# Patient Record
Sex: Male | Born: 1935 | Race: White | Hispanic: No | Smoking: Former smoker
Health system: Southern US, Community
[De-identification: ages and names within clinical notes are randomized; demographics above are authoritative.]

## PROBLEM LIST (undated history)

## (undated) DIAGNOSIS — M1990 Unspecified osteoarthritis, unspecified site: Secondary | ICD-10-CM

## (undated) DIAGNOSIS — D494 Neoplasm of unspecified behavior of bladder: Secondary | ICD-10-CM

## (undated) DIAGNOSIS — C679 Malignant neoplasm of bladder, unspecified: Secondary | ICD-10-CM

## (undated) DIAGNOSIS — Z789 Other specified health status: Secondary | ICD-10-CM

## (undated) DIAGNOSIS — D0422 Carcinoma in situ of skin of left ear and external auricular canal: Secondary | ICD-10-CM

## (undated) DIAGNOSIS — D229 Melanocytic nevi, unspecified: Secondary | ICD-10-CM

## (undated) DIAGNOSIS — N400 Enlarged prostate without lower urinary tract symptoms: Secondary | ICD-10-CM

## (undated) DIAGNOSIS — B351 Tinea unguium: Secondary | ICD-10-CM

## (undated) HISTORY — PX: EYE SURGERY: SHX253

## (undated) HISTORY — PX: OTHER SURGICAL HISTORY: SHX169

## (undated) HISTORY — PX: TONSILLECTOMY: SUR1361

## (undated) HISTORY — DX: Tinea unguium: B35.1

---
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
## 1996-10-12 DIAGNOSIS — D0422 Carcinoma in situ of skin of left ear and external auricular canal: Secondary | ICD-10-CM

## 1996-10-12 HISTORY — DX: Carcinoma in situ of skin of left ear and external auricular canal: D04.22

## 2000-02-25 ENCOUNTER — Other Ambulatory Visit: Admission: RE | Admit: 2000-02-25 | Discharge: 2000-02-25 | Payer: Self-pay | Admitting: Urology

## 2004-08-31 ENCOUNTER — Encounter: Admission: RE | Admit: 2004-08-31 | Discharge: 2004-08-31 | Payer: Self-pay | Admitting: Family Medicine

## 2004-11-10 ENCOUNTER — Ambulatory Visit: Payer: Self-pay | Admitting: Internal Medicine

## 2004-11-17 ENCOUNTER — Ambulatory Visit: Payer: Self-pay | Admitting: Internal Medicine

## 2004-12-27 ENCOUNTER — Inpatient Hospital Stay (HOSPITAL_COMMUNITY): Admission: EM | Admit: 2004-12-27 | Discharge: 2004-12-29 | Payer: Self-pay | Admitting: Emergency Medicine

## 2008-07-22 ENCOUNTER — Encounter: Admission: RE | Admit: 2008-07-22 | Discharge: 2008-08-16 | Payer: Self-pay | Admitting: Family Medicine

## 2008-11-08 HISTORY — PX: JOINT REPLACEMENT: SHX530

## 2009-01-20 ENCOUNTER — Inpatient Hospital Stay (HOSPITAL_COMMUNITY): Admission: RE | Admit: 2009-01-20 | Discharge: 2009-01-24 | Payer: Self-pay | Admitting: Orthopedic Surgery

## 2009-03-03 ENCOUNTER — Encounter: Admission: RE | Admit: 2009-03-03 | Discharge: 2009-04-10 | Payer: Self-pay | Admitting: Orthopedic Surgery

## 2009-12-26 IMAGING — CR DG CHEST 1V PORT
1 series · 1 of 1 positions shown · non-contrast
Comparison: 01/10/2009.

CLINICAL DATA: Postop fever.

PORTABLE CHEST - 1 VIEW

[view not recorded]
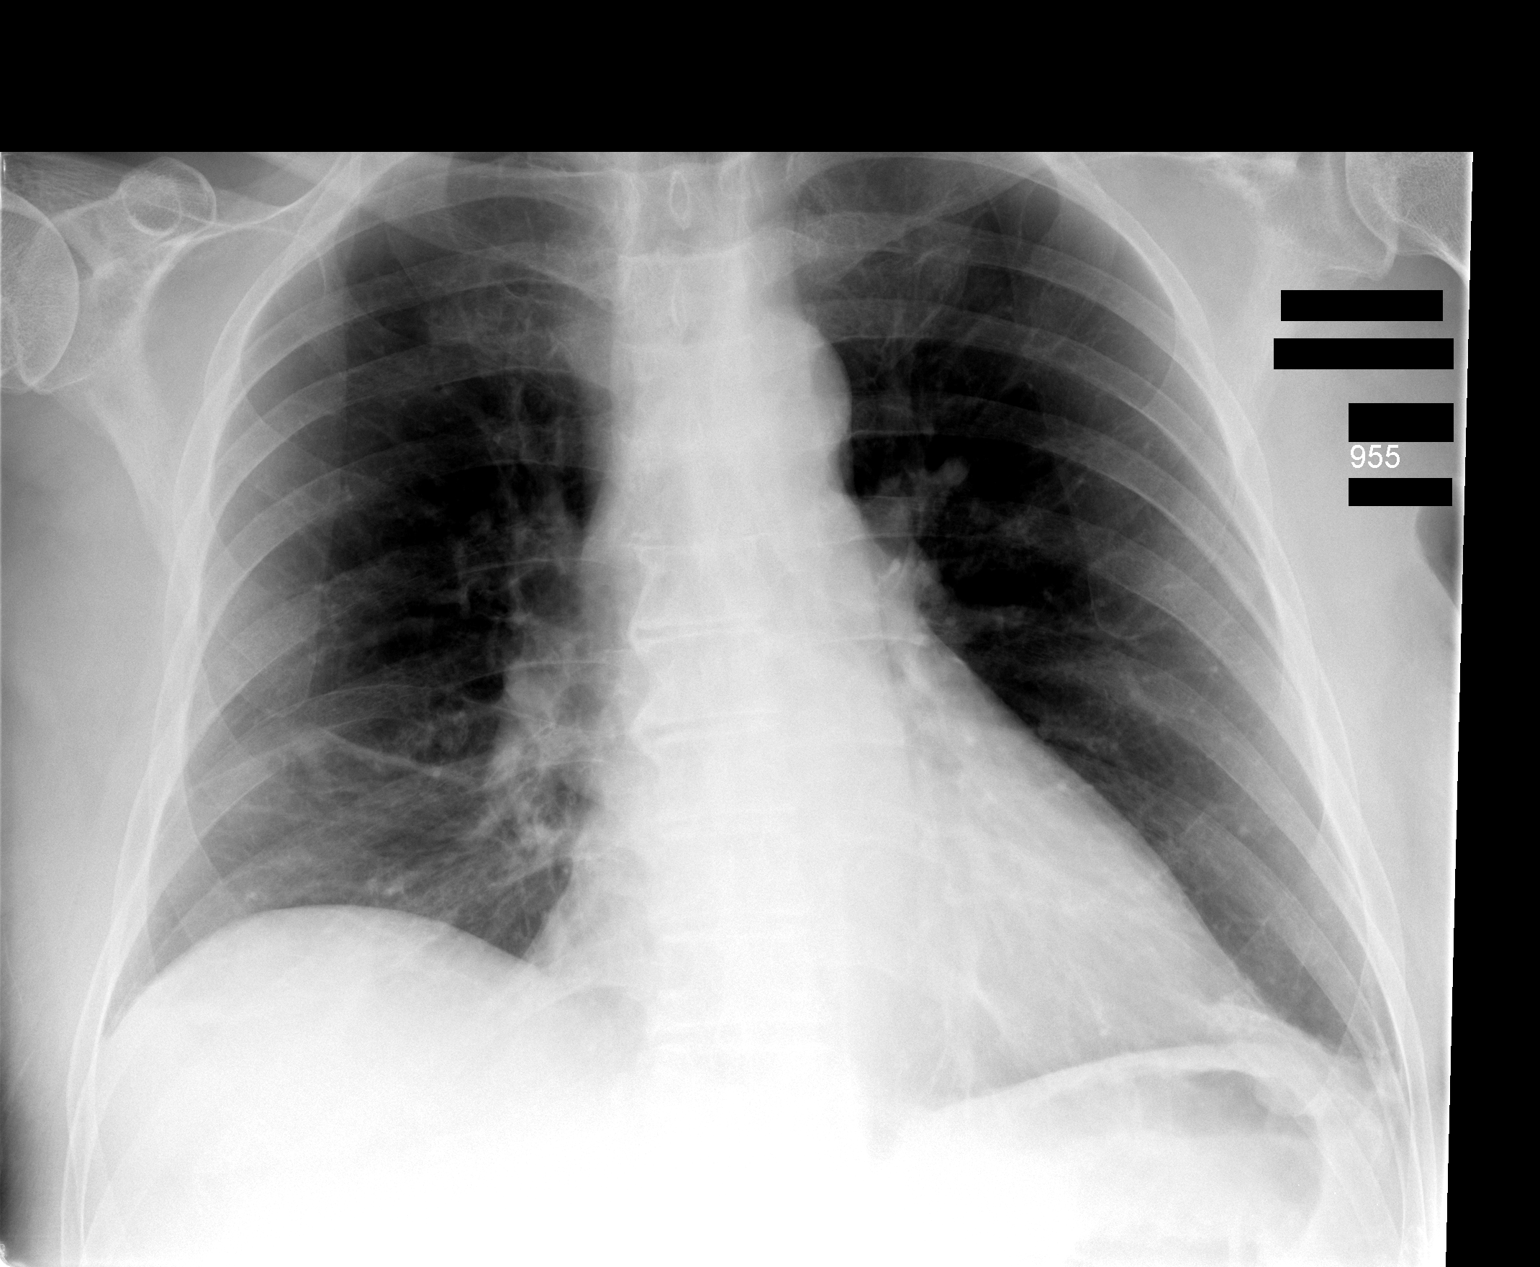

[1 of 1 positions shown; findings below may reference images not displayed]

FINDINGS: Basilar subsegmental atelectasis.  No segmental
infiltrate, pneumothorax or pulmonary edema.  Heart is slightly
enlarged.
IMPRESSION: Bibasilar subsegmental atelectatic changes.

No segmental infiltrate.

No pulmonary edema.

## 2011-02-18 LAB — BASIC METABOLIC PANEL
BUN: 18 mg/dL (ref 6–23)
BUN: 20 mg/dL (ref 6–23)
BUN: 20 mg/dL (ref 6–23)
BUN: 25 mg/dL — ABNORMAL HIGH (ref 6–23)
BUN: 26 mg/dL — ABNORMAL HIGH (ref 6–23)
CO2: 24 mEq/L (ref 19–32)
CO2: 24 mEq/L (ref 19–32)
CO2: 24 mEq/L (ref 19–32)
CO2: 27 mEq/L (ref 19–32)
CO2: 27 mEq/L (ref 19–32)
CO2: 27 mEq/L (ref 19–32)
Calcium: 7.4 mg/dL — ABNORMAL LOW (ref 8.4–10.5)
Calcium: 7.6 mg/dL — ABNORMAL LOW (ref 8.4–10.5)
Calcium: 7.7 mg/dL — ABNORMAL LOW (ref 8.4–10.5)
Calcium: 7.8 mg/dL — ABNORMAL LOW (ref 8.4–10.5)
Calcium: 7.8 mg/dL — ABNORMAL LOW (ref 8.4–10.5)
Calcium: 7.9 mg/dL — ABNORMAL LOW (ref 8.4–10.5)
Chloride: 100 mEq/L (ref 96–112)
Chloride: 101 mEq/L (ref 96–112)
Chloride: 102 mEq/L (ref 96–112)
Chloride: 104 mEq/L (ref 96–112)
Chloride: 97 mEq/L (ref 96–112)
Creatinine, Ser: 0.99 mg/dL (ref 0.4–1.5)
Creatinine, Ser: 1.04 mg/dL (ref 0.4–1.5)
Creatinine, Ser: 1.05 mg/dL (ref 0.4–1.5)
Creatinine, Ser: 1.38 mg/dL (ref 0.4–1.5)
Creatinine, Ser: 1.49 mg/dL (ref 0.4–1.5)
GFR calc Af Amer: 54 mL/min — ABNORMAL LOW (ref >60)
GFR calc Af Amer: 59 mL/min — ABNORMAL LOW (ref >60)
GFR calc Af Amer: >60 mL/min (ref >60)
GFR calc Af Amer: >60 mL/min (ref >60)
GFR calc Af Amer: >60 mL/min (ref >60)
GFR calc Af Amer: >60 mL/min (ref >60)
GFR calc non Af Amer: 45 mL/min — ABNORMAL LOW (ref >60)
GFR calc non Af Amer: 49 mL/min — ABNORMAL LOW (ref >60)
GFR calc non Af Amer: >60 mL/min (ref >60)
GFR calc non Af Amer: >60 mL/min (ref >60)
GFR calc non Af Amer: >60 mL/min (ref >60)
Glucose, Bld: 110 mg/dL — ABNORMAL HIGH (ref 70–99)
Glucose, Bld: 136 mg/dL — ABNORMAL HIGH (ref 70–99)
Glucose, Bld: 148 mg/dL — ABNORMAL HIGH (ref 70–99)
Glucose, Bld: 175 mg/dL — ABNORMAL HIGH (ref 70–99)
Glucose, Bld: 201 mg/dL — ABNORMAL HIGH (ref 70–99)
Glucose, Bld: 218 mg/dL — ABNORMAL HIGH (ref 70–99)
Potassium: 3.6 mEq/L (ref 3.5–5.1)
Potassium: 3.6 mEq/L (ref 3.5–5.1)
Potassium: 4 mEq/L (ref 3.5–5.1)
Potassium: 4.4 mEq/L (ref 3.5–5.1)
Potassium: 4.7 mEq/L (ref 3.5–5.1)
Potassium: 4.2 mEq/L (ref 3.5–5.1)
Sodium: 127 mEq/L — ABNORMAL LOW (ref 135–145)
Sodium: 130 mEq/L — ABNORMAL LOW (ref 135–145)
Sodium: 131 mEq/L — ABNORMAL LOW (ref 135–145)
Sodium: 134 mEq/L — ABNORMAL LOW (ref 135–145)
Sodium: 134 mEq/L — ABNORMAL LOW (ref 135–145)
Sodium: 135 mEq/L (ref 135–145)

## 2011-02-18 LAB — URINALYSIS, ROUTINE W REFLEX MICROSCOPIC
Hgb urine dipstick: NEGATIVE
Ketones, ur: NEGATIVE mg/dL
Protein, ur: NEGATIVE mg/dL
Urobilinogen, UA: 0.2 mg/dL (ref 0.0–1.0)

## 2011-02-18 LAB — PROTIME-INR
INR: 1.3 (ref 0.00–1.49)
INR: 1.5 (ref 0.00–1.49)
INR: 1.6 — ABNORMAL HIGH (ref 0.00–1.49)
INR: 1.7 — ABNORMAL HIGH (ref 0.00–1.49)
INR: 1 (ref 0.00–1.49)
Prothrombin Time: 17 seconds — ABNORMAL HIGH (ref 11.6–15.2)
Prothrombin Time: 18.9 seconds — ABNORMAL HIGH (ref 11.6–15.2)
Prothrombin Time: 19.7 seconds — ABNORMAL HIGH (ref 11.6–15.2)
Prothrombin Time: 21 seconds — ABNORMAL HIGH (ref 11.6–15.2)
Prothrombin Time: 13.7 seconds (ref 11.6–15.2)

## 2011-02-18 LAB — TYPE AND SCREEN: ABO/RH(D): A POS

## 2011-02-18 LAB — HEMOGLOBIN AND HEMATOCRIT, BLOOD: HCT: 26 % — ABNORMAL LOW (ref 39.0–52.0)

## 2011-02-18 LAB — CBC
HCT: 23.1 % — ABNORMAL LOW (ref 39.0–52.0)
HCT: 23.2 % — ABNORMAL LOW (ref 39.0–52.0)
HCT: 26.7 % — ABNORMAL LOW (ref 39.0–52.0)
HCT: 26.9 % — ABNORMAL LOW (ref 39.0–52.0)
HCT: 30 % — ABNORMAL LOW (ref 39.0–52.0)
HCT: 38.5 % — ABNORMAL LOW (ref 39.0–52.0)
Hemoglobin: 10.1 g/dL — ABNORMAL LOW (ref 13.0–17.0)
Hemoglobin: 7.9 g/dL — CL (ref 13.0–17.0)
Hemoglobin: 7.9 g/dL — CL (ref 13.0–17.0)
Hemoglobin: 9.1 g/dL — ABNORMAL LOW (ref 13.0–17.0)
Hemoglobin: 9.2 g/dL — ABNORMAL LOW (ref 13.0–17.0)
Hemoglobin: 13 g/dL (ref 13.0–17.0)
MCHC: 33.8 g/dL (ref 30.0–36.0)
MCHC: 33.9 g/dL (ref 30.0–36.0)
MCHC: 34 g/dL (ref 30.0–36.0)
MCHC: 34.3 g/dL (ref 30.0–36.0)
MCHC: 34.3 g/dL (ref 30.0–36.0)
MCHC: 33.9 g/dL (ref 30.0–36.0)
MCV: 91.8 fL (ref 78.0–100.0)
MCV: 92.8 fL (ref 78.0–100.0)
MCV: 93.3 fL (ref 78.0–100.0)
MCV: 93.4 fL (ref 78.0–100.0)
MCV: 95.8 fL (ref 78.0–100.0)
MCV: 95.8 fL (ref 78.0–100.0)
Platelets: 121 10*3/uL — ABNORMAL LOW (ref 150–400)
Platelets: 121 10*3/uL — ABNORMAL LOW (ref 150–400)
Platelets: 125 10*3/uL — ABNORMAL LOW (ref 150–400)
Platelets: 128 10*3/uL — ABNORMAL LOW (ref 150–400)
Platelets: 145 10*3/uL — ABNORMAL LOW (ref 150–400)
Platelets: 218 10*3/uL (ref 150–400)
RBC: 2.42 MIL/uL — ABNORMAL LOW (ref 4.22–5.81)
RBC: 2.48 MIL/uL — ABNORMAL LOW (ref 4.22–5.81)
RBC: 2.9 MIL/uL — ABNORMAL LOW (ref 4.22–5.81)
RBC: 2.91 MIL/uL — ABNORMAL LOW (ref 4.22–5.81)
RBC: 3.21 MIL/uL — ABNORMAL LOW (ref 4.22–5.81)
RBC: 4.02 MIL/uL — ABNORMAL LOW (ref 4.22–5.81)
RDW: 12.8 % (ref 11.5–15.5)
RDW: 14.9 % (ref 11.5–15.5)
RDW: 15 % (ref 11.5–15.5)
RDW: 15.4 % (ref 11.5–15.5)
RDW: 15.5 % (ref 11.5–15.5)
RDW: 13.2 % (ref 11.5–15.5)
WBC: 6 10*3/uL (ref 4.0–10.5)
WBC: 7.1 10*3/uL (ref 4.0–10.5)
WBC: 8.1 10*3/uL (ref 4.0–10.5)
WBC: 8.8 10*3/uL (ref 4.0–10.5)
WBC: 9.2 10*3/uL (ref 4.0–10.5)
WBC: 3.9 10*3/uL — ABNORMAL LOW (ref 4.0–10.5)

## 2011-02-18 LAB — COMPREHENSIVE METABOLIC PANEL
ALT: 22 U/L (ref 0–53)
AST: 29 U/L (ref 0–37)
Albumin: 3.8 g/dL (ref 3.5–5.2)
Alkaline Phosphatase: 46 U/L (ref 39–117)
BUN: 19 mg/dL (ref 6–23)
CO2: 30 mEq/L (ref 19–32)
Calcium: 9.6 mg/dL (ref 8.4–10.5)
Chloride: 103 mEq/L (ref 96–112)
Creatinine, Ser: 1.12 mg/dL (ref 0.4–1.5)
GFR calc Af Amer: >60 mL/min (ref >60)
GFR calc non Af Amer: >60 mL/min (ref >60)
Glucose, Bld: 112 mg/dL — ABNORMAL HIGH (ref 70–99)
Potassium: 4.5 mEq/L (ref 3.5–5.1)
Sodium: 140 mEq/L (ref 135–145)
Total Bilirubin: 0.8 mg/dL (ref 0.3–1.2)
Total Protein: 7.8 g/dL (ref 6.0–8.3)

## 2011-02-18 LAB — HEMOCCULT GUIAC POC 1CARD (OFFICE): Fecal Occult Bld: NEGATIVE

## 2011-03-23 NOTE — Op Note (Signed)
NAME:  ESKER, DEVER NO.:  [id number]   MEDICAL RECORD NO.:  [id number]          PATIENT TYPE:  INP   LOCATION:  0007                         FACILITY:  [facility]   PHYSICIAN:  Ollen Gross, M.D.    DATE OF BIRTH:  [birth year]   DATE OF PROCEDURE:  01/20/2009  DATE OF DISCHARGE:                               OPERATIVE REPORT   PREOPERATIVE DIAGNOSIS:  Osteoarthritis right knee.   POSTOPERATIVE DIAGNOSIS:  Osteoarthritis right knee.   PROCEDURE:  Right total knee arthroplasty.   SURGEON:  Ollen Gross, M.D.   ASSISTANT:  Avel Peace PA-C   ANESTHESIA:  Spinal.   ESTIMATED BLOOD LOSS:  Minimal.   DRAINS:  None.   TOURNIQUET TIME:  42 minutes at 300 mmHg.   COMPLICATIONS:  None.   CONDITION:  Stable to recovery room.   BRIEFI CLINICAL NOTE:  Mr. Granade is an 77 year old male with end-stage  arthritis of the right knee with progressively worsening pain and  dysfunction.  He has significant valgus deformity of the knee.  He has  failed nonoperative management and presents for total knee arthroplasty.   PROCEDURE IN DETAIL:  After successful administration of spinal  anesthetic, a tourniquet was placed on the right thigh and right lower  extremity prepped and draped in the usual sterile fashion.  Extremity  was wrapped in Esmarch, knee flexed, tourniquet inflated to 300 mmHg.  Midline incision was made with a 10 blade through subcutaneous tissue to  the level of the extensor mechanism.  Fresh blade is used to make a  lateral parapatellar arthrotomy.  I went lateral because of valgus  deformity.  Soft tissue of the proximal lateral tibia subperiosteally  elevated around the joint line to the posterolateral corner but not  including the structures of the posterolateral corner.  Patella was  everted medially, knee flexed 90 degrees and ACL and PCL removed.  Drill  was used to create a starting hole in the distal femur.  The canal was  thoroughly irrigated.   The 5 degree right valgus alignment guide is  placed and referencing off the posterior condyles, rotations marked and  the block pinned to remove 11 mm off the distal femur.  I took 11  because of preop flexion contracture.  Distal femoral resection is made  with an oscillating saw.  Sizing blocks placed, size 5 is most  appropriate.  Rotations marked off the epicondylar axis.  The size 5  cutting block is placed and the anterior, posterior and chamfer cuts are  made.   The tibia is subluxed forward and the menisci are removed.  Extramedullary tibial alignment guide is placed referencing proximally  at the medial aspect of the tibial tubercle and distally along the  second metatarsal axis and tibial crest.  The block is pinned to remove  about 4 mm off the more deficient lateral side.  Tibial resection is  made with an oscillating saw.  Size 5 is most appropriate tibial  component and the proximal tibia prepared with the modular drill and  keel punch for the size 5.  Femoral preparation is  completed with the  intercondylar cut.   Size 5 mobile bearing tibial trial, size 5 posterior stabilized femoral  trial and a 10 mm posterior stabilized rotating platform insert trial  are placed.  With the 10 there is some AP laxity, so I went to 12.5  which allowed for full extension with excellent varus-valgus and  anterior-posterior balance throughout full range of motion.  The patella  was everted again and thickness measured to be 28 mm.  Freehand  resection is taken to 16 mm, 41 template is placed, lug holes were  drilled, trial patella was placed and it tracks normally.  Osteophytes  removed off the posterior femur with the trial in place.  All trials  were removed and the cut bone surfaces are prepared with pulsatile  lavage.  Cement was mixed and once ready for implantation, the size 5  mobile bearing tibial tray, size 5 posterior stabilized femur and 41  patella are cemented into place.   The patella was held with a clamp.  Trial 12.5-mm inserts placed, knee held in full extension and all  extruded cement removed.  Once cement is fully hardened then the  permanent 12.5 mm posterior stabilized rotating platform insert is  placed into the tibial tray.  The wound was copiously irrigated with  saline solution and the FloSeal injected on the posterior capsule,  mediolateral gutters and suprapatellar area.  Moist sponge is placed and  tourniquet released for total time of 42 minutes.  Sponge was held for 2  minutes and removed.  Minimal bleeding was encountered.  Bleeding that  is encountered stopped with electrocautery.  The joints again irrigated  with saline, then the arthrotomy closed with interrupted #1 PDS leaving  open a small area from the superior to inferior pole of the patella to  serve as a mini lateral release.  Flexion against gravity is about 135  degrees and the patella tracks normally.  Subcu was closed with  interrupted 2-0 Vicryl and subcuticular running 4-0 Monocryl.  The  incision was cleaned and dried and Steri-Strips and bulky sterile  dressing applied.  He is then placed into a knee immobilizer, awakened  and transferred to recovery in stable condition.      Ollen Gross, M.D.  Electronically Signed     FA/MEDQ  D:  01/20/2009  T:  01/20/2009  Job:  [id number]

## 2011-03-23 NOTE — Discharge Summary (Signed)
NAME:  Zachary Obrien, Zachary Obrien              ACCOUNT NO.:  [id number]   MEDICAL RECORD NO.:  [id number]          PATIENT TYPE:  INP   LOCATION:  1614                         FACILITY:  [facility]   PHYSICIAN:  Ollen Gross, M.D.    DATE OF BIRTH:  06-Mar-1921   DATE OF ADMISSION:  01/20/2009  DATE OF DISCHARGE:  01/24/2009                               DISCHARGE SUMMARY   ADMITTING DIAGNOSES:  1. Osteoarthritis, right knee.  2. Macular degeneration.  3. Benign prostatic hypertrophy.  4. Elevated PSA (2 negative biopsies in the past).  5. Iron deficiency anemia.   DISCHARGE DIAGNOSES:  1. Osteoarthritis, right knee, status post right total knee      replacement arthroplasty.  2. Postop acute blood loss anemia.  3. Status post transfusion without sequelae.  4. Hyponatremia, labs pending.  5. Macular degeneration.  6. Benign prostatic hypertrophy.  7. Elevated PSA (2 negative biopsies in the past).  8. Iron deficiency anemia.   PROCEDURE:  January 20, 2009, right total knee.  Surgeon, Dr. Lequita Halt.  Assistant, Avel Peace, P.A.-C.  Spinal anesthesia with Duramorph  added.   CONSULTS:  None.   BRIEF HISTORY:  Mr. Zachary Obrien is an 77 year old male with end-stage  arthritis of the right knee, progressive worsening pain and dysfunction,  significant valgus deformity, failed nonoperative management, now  presents for total knee arthroplasty.   LABORATORY DATA:  Preop CBC showed a hemoglobin of 13.0, hematocrit of  38.5, white cell count 3.9, platelets 218.  PT/INR 13.7 and 1.0 with a  PTT of 41.  Chem panel on admission all within normal limits.  Preop UA  was negative.  Postop hemoglobin dropped down to 8.7 and 7.9, was given  blood, it came back up to 9.1.  Unfortunately, it drifted back down  again to 7.9.  We did check his stool and it was Hemoccult negative.  Post transfusion, second transfusion, 10.1.  Last noted H and H 9.2 and  26.7.  Serial pro times followed per Coumadin protocol.  Last  noted  PT/INR 21.0 and 1.7.  BMET on admission, sodium dropped from 140 to 134,  got down to 130, back up to 131, but dropped again down to 127, usually  a dilutional component.  Glucose went up from 112 to 218, back down to  110.  Remaining electrolytes remained within normal limits.  Again, the  occult blood fecal was negative.   X-RAYS:  Two-view chest x-ray, January 10, 2009, stable cardiopulmonary  appearance, no acute disease.  We did a portable chest x-ray on January 24, 2009, do not have the dictation but the COD report showed bibasilar  subsegmental atelectasis with no infiltrate and no effusion.  EKG dated  January 03, 2009, sinus rhythm at 78, normal intervals and axis, normal  EKG.   HOSPITAL COURSE:  Patient admitted to [facility], take to the  OR, underwent above-said procedure without complications.  Patient  tolerated procedure well and later transferred to recovery room on  orthopedic floor.  Started on PCA and p.o. analgesic pain control  following surgery and given 2 hours of  postop IV antibiotics, started on  Coumadin for DVT prophylaxis.  He was doing pretty well on the morning  of surgery but he had a little bit of mild hypotension which was felt to  be due to his blood loss.  His hemoglobins was down to 7.9.  His output  was low also.  The low output was felt to be secondary to the anemia  which was more acute although he did have chronic anemia going in with a  history of iron-deficiency anemia and also felt to be due to the low  pressure so we gave him fluids and also transfused him.  Another  component of his hypotension was probably the Duramorph that was added.  We rechecked his BMET to make sure he was not going in to renal  insufficiency.  His BUN and creatinine remained stable.  His blood count  responded.  He got 2 units and it came back up to 9.  By postop day 2,  he was sitting up in bed.  Pressure was back up in the 120s.  Hemoglobin  looked  better.  Dressing change, incision looked good.  We got social  work involved because we felt like he would need an inpatient stay for  rehab facility.  Sodium was a little bit low so we backed down on his  fluids and he had a positive volume from the fluids we had to give for  the hypotension and also from the blood.  Felt he would diurese pretty  well.  His pressure was back.  By day 3, he was doing well.  No  complaints.  Thigh was a little swollen but nothing out of the ordinary.  Knee looked good.  Incision looked good although his hemoglobin was down  a little bit again down to 7.9.  We gave him 2 more units of blood.  We  did guaiac his stool and it did prove to be negative.  There were no  occult signs of any kind of blood loss from the GI tract.  We did put  him on a GI medication.  The sodium was up a little bit from 130 to 131.  We rechecked his labs the following day.  His hemoglobin was back up  after the transfusion and it was back up to 10.1 after the blood and  then on the day of discharge on January 24, 2009, his hemoglobin was 9.2.  He was seen in rounds, had a little bit of congestion, a little mild low-  grade temp which was felt to be just postop so we did get a chest x-ray.  That chest x-ray on the last day did show some subsegmental atelectasis.  We encouraged incentive spirometer although did not show any effusion  and did not show any infiltrates, it was negative for those.  He was  doing well.  Bed was found at [facility] and we decided patient would be  discharged over at that time.   DISCHARGE PLAN:  1. Patient will be transferred over to [facility] on      January 24, 2009.  2. Discharge diagnoses:  Please see above.  3. Discharge meds.   CURRENT MEDICATIONS:  Include:  1. Coumadin protocol, please titrate the Coumadin level for target INR      between 2.0 and 3.0, needs to be on Coumadin for a total of 3 weeks      from the date of surgery of  January 20, 2009.  2. Colace 100 mg p.o. b.i.d.  3. Finasteride 5 mg p.o. daily.  4. Nu-Iron 150 mg p.o. b.i.d.  He needs to be on iron for 3 weeks, the      150 Nu-Iron, and then after the 3 weeks he can go back on to his      daily iron supplement which is 65 mg daily.  5. We did put him on Protonix 40 mg.  I would continue the Protonix 40      mg daily while he is on the Coumadin for GI prophylaxis then      discontinue the Protonix once he is off his Coumadin.  6. Percocet 5 mg 1 or 2 every 4 hours as needed for pain.  7. Tylenol 325 one or two every 4 to 6 hours as needed for mild pain,      temp, or headache.  8. Robaxin 500 mg p.o. every 6 to 8 hours p.r.n. spasm.  9. Laxative of choice.  10.Enema of choice.   DIET:  As tolerated.   ACTIVITY:  He is weightbearing as tolerated to the right lower  extremity.  Continue gait training, ambulation, ADLs.  We would like to  arrange a CPM machine at [facility].  He needs to be in the CPM  machine 2 to 3 sessions a day, maximum 2 to 3 hours per session, do not  let the patient go over 3 hours in a session but he needs to have 2 to 3  sessions a day in the CPM machine.  Slowly increase the flexion every  day or every other day to improve his range of motion.  Continue with PT  and OT for gait training, ambulation, ADLs, and total knee protocol.  He  may start showering, however, do not submerge the incision under water.   DISPOSITION:  [facility].   CONDITION UPON DISCHARGE:  Improving.   COUMADIN PROTOCOL:  The pro time INR on admission was 13.7 with an INR  of 1.1 on the date of surgery.  He was given a 4-mg tablet.  On postop  day 1, his INR was 1.3.  He was given a 4-mg tablet.  On postop day 2,  his INR was up to 1.5.  He was given 4-mg tablet.  On postop day 3, his  INR was up to 1.6.  He was given a 4-mg tablet.  On postop day 4, his  INR was 1.7 at the time of transfer.      Alexzandrew L. Perkins,  P.A.C.      Ollen Gross, M.D.  Electronically Signed    ALP/MEDQ  D:  01/24/2009  T:  01/24/2009  Job:  [id number]   cc:   Ollen Gross, M.D.  Fax: [phone]   Candyce Churn, M.D.  Fax: [phone]

## 2011-03-23 NOTE — H&P (Signed)
NAME:  Zachary Obrien, Zachary Obrien NO.:  [id number]   MEDICAL RECORD NO.:  [id number]          PATIENT TYPE:  INP   LOCATION:                               FACILITY:  [facility]   PHYSICIAN:  Ollen Gross, M.D.    DATE OF BIRTH:  08-15-1921   DATE OF ADMISSION:  01/20/2009  DATE OF DISCHARGE:                              HISTORY & PHYSICAL   DATE OF OFFICE VISIT/HISTORY AND PHYSICAL:  January 16, 2009.   DATE OF ADMISSION:  January 20, 2009   CHIEF COMPLAINT:  Right knee pain.   HISTORY OF PRESENT ILLNESS:  The patient is an 77 year old male who has  been seen by Dr. Lequita Halt for ongoing problems with his right knee for  quite some time now.  He has had many years of knee pain.  He has been  seen in the office, found to have end-stage arthritis.  He talked about  doing either injections or conservative treatment.  The patient wants to  have more of a permanent fix.  It is felt he has reached the point where  he would benefit undergoing surgical intervention.  Risks and benefits  have been discussed.  He elects to proceed with surgery.   ALLERGIES:  No known drug allergies.   CURRENT MEDICATIONS:  Iron supplement, Ocuvite, baby aspirin, bilberry  supplement and finasteride.   PAST MEDICAL HISTORY:  1. Macular degeneration.  2. Benign prostatic hypertrophy.  3. Elevated PSA (two negative biopsies in the past).  4. Iron-deficiency anemia.   PAST SURGICAL HISTORY:  1. Detached retina repair in 1987.  2. Hydrocele surgery in 1989.  3. Two cataract surgeries in 1991and 1992.  4. Corneal transplants in 2005 and 2006.   SOCIAL HISTORY:  Married, retired, past smoker.  Usually one cocktail an  evening.  Three children.  Lives alone so he does want to look into a  rehab facility.  Does have a living will, healthcare power-of-attorney.   FAMILY HISTORY:  Father deceased with heart disease.  Mother deceased  with heart disease.   REVIEW OF SYSTEMS:  GENERAL:  No fevers, chills,  night sweats.  NEURO:  No seizures, syncope or paralysis.  RESPIRATORY:  No shortness of  breath, productive cough or hemoptysis.  CARDIOVASCULAR:  No chest pain  or orthopnea.  GI:  No nausea, vomiting, diarrhea, constipation.  GU:  Little bit of nocturia and frequency.  No dysuria, hematuria.  MUSCULOSKELETAL:  Right knee.   PHYSICAL EXAMINATION:  VITAL SIGNS:  Pulse 76, respirations 12, blood  pressure 130/58.  GENERAL:  77 year old white male, thin frame, alert, oriented and  cooperative, very pleasant.  Good historian.  HEENT:  Normocephalic, atraumatic.  Pupils are round and reactive.  EOMs  intact.  Noted to wear glasses.  NECK:  Supple.  No bruits.  CHEST:  Clear anterior posterior chest.  No rhonchi, rales or wheezing.  HEART:  Regular rate and rhythm.  No murmur.  S1, S2 noted.  ABDOMEN:  Soft, nontender, slightly round.  Bowel sounds present.  RECTAL/BREAST/GENITALIA:  Not done, not pertinent to present illness.  EXTREMITIES:  Right knee range of motion 15-120, marked crepitus, slight  valgus malalignment deformity.  No instability.   IMPRESSION:  Osteoarthritis right knee.   PLAN:  The patient will be admitted to [facility] to undergo a  right total knee replacement arthroplasty.  Surgery will be performed by  Dr. Ollen Gross.  His medical physician, Dr. Johnella Moloney, will be  notified of the room number and admission, and will be consulted if  needed for any medical assistance with the patient throughout the  hospital course.      Alexzandrew L. Perkins, P.A.C.      Ollen Gross, M.D.  Electronically Signed    ALP/MEDQ  D:  01/16/2009  T:  01/16/2009  Job:  [id number]   cc:   Ollen Gross, M.D.  Fax: [phone]   Candyce Churn, M.D.  Fax: [phone]

## 2011-03-28 ENCOUNTER — Ambulatory Visit (HOSPITAL_COMMUNITY)
Admission: RE | Admit: 2011-03-28 | Discharge: 2011-03-28 | Disposition: A | Discharge status: Home / Self Care | Payer: Medicare Other | Source: Ambulatory Visit | Attending: Family Medicine | Admitting: Family Medicine

## 2011-03-28 ENCOUNTER — Other Ambulatory Visit: Payer: Self-pay | Admitting: Family Medicine

## 2011-03-28 DIAGNOSIS — R1032 Left lower quadrant pain: Secondary | ICD-10-CM

## 2011-03-28 DIAGNOSIS — N62 Hypertrophy of breast: Secondary | ICD-10-CM | POA: Insufficient documentation

## 2011-03-28 DIAGNOSIS — I2510 Atherosclerotic heart disease of native coronary artery without angina pectoris: Secondary | ICD-10-CM | POA: Insufficient documentation

## 2011-03-28 DIAGNOSIS — J984 Other disorders of lung: Secondary | ICD-10-CM | POA: Insufficient documentation

## 2011-03-28 DIAGNOSIS — Q618 Other cystic kidney diseases: Secondary | ICD-10-CM | POA: Insufficient documentation

## 2011-03-28 DIAGNOSIS — M47817 Spondylosis without myelopathy or radiculopathy, lumbosacral region: Secondary | ICD-10-CM | POA: Insufficient documentation

## 2011-03-28 DIAGNOSIS — K5730 Diverticulosis of large intestine without perforation or abscess without bleeding: Secondary | ICD-10-CM | POA: Insufficient documentation

## 2011-03-28 LAB — BUN: BUN: 18 mg/dL (ref 6–23)

## 2011-03-28 LAB — CREATININE, SERUM
GFR calc Af Amer: >60 mL/min (ref >60)
GFR calc non Af Amer: >60 mL/min (ref >60)

## 2011-03-28 MED ORDER — IOHEXOL 300 MG/ML  SOLN
125.0000 mL | Freq: Once | INTRAMUSCULAR | Status: AC | PRN
  Administered 2011-03-28: 125 mL via INTRAVENOUS

## 2011-05-10 ENCOUNTER — Encounter: Payer: Self-pay | Admitting: Podiatry

## 2011-07-19 ENCOUNTER — Emergency Department (HOSPITAL_COMMUNITY)
Admission: EM | Admit: 2011-07-19 | Discharge: 2011-07-19 | Disposition: A | Discharge status: Home / Self Care | Payer: Medicare Other | Attending: Emergency Medicine | Admitting: Emergency Medicine

## 2011-07-19 DIAGNOSIS — X58XXXA Exposure to other specified factors, initial encounter: Secondary | ICD-10-CM | POA: Insufficient documentation

## 2011-07-19 DIAGNOSIS — S01502A Unspecified open wound of oral cavity, initial encounter: Secondary | ICD-10-CM | POA: Insufficient documentation

## 2011-07-19 DIAGNOSIS — Z7982 Long term (current) use of aspirin: Secondary | ICD-10-CM | POA: Insufficient documentation

## 2011-07-19 DIAGNOSIS — Z0000 Encounter for general adult medical examination without abnormal findings: Secondary | ICD-10-CM | POA: Insufficient documentation

## 2012-04-14 ENCOUNTER — Ambulatory Visit (INDEPENDENT_AMBULATORY_CARE_PROVIDER_SITE_OTHER): Payer: Medicare Other | Admitting: Family Medicine

## 2012-04-14 VITALS — BP 123/72 | HR 74 | Temp 98.5°F | Resp 16 | Ht 69.0 in | Wt 172.0 lb

## 2012-04-14 DIAGNOSIS — H9190 Unspecified hearing loss, unspecified ear: Secondary | ICD-10-CM

## 2012-04-14 DIAGNOSIS — H9192 Unspecified hearing loss, left ear: Secondary | ICD-10-CM

## 2012-04-14 DIAGNOSIS — H6120 Impacted cerumen, unspecified ear: Secondary | ICD-10-CM

## 2012-04-14 NOTE — Progress Notes (Signed)
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 He's had no pain or fever. He's had no barotrauma in either.  Objective: Moderate amount of wax noted in the left ear, right ear clear  Left ear lavaged clean. There is some bleeding at the floor of the canal and the patient is somewhat uncomfortable during the procedure, but there is no active bleeding. The TM does show redness only malleus without bulging, hemorrhage, or thickening.  Assessment: Cerumen impaction-resolved  Plan: Cortisporin drop periodically to reduce the wax buildup

## 2012-08-09 ENCOUNTER — Other Ambulatory Visit: Payer: Self-pay | Admitting: Urology

## 2012-08-30 ENCOUNTER — Encounter (HOSPITAL_COMMUNITY): Payer: Self-pay | Admitting: Pharmacy Technician

## 2012-09-01 NOTE — Patient Instructions (Addendum)
20 NAHEIM BURGEN  09/01/2012   Your procedure is scheduled on:  09-13-2012   Report to [facility] at  1245 pm.  Call this number if you have problems the morning of surgery: [phone]   Remember: driver for surgery son michael cell [phone]   Do not eat food :After Midnight.  .clear liquids midnight until 0915 am, then nothing by mouth  Take these medicines the morning of surgery with A SIP OF WATER: no meds to take   Do not wear jewelry or make up.  Do not wear lotions, powders, or perfumes. You may wear deodorant.    Do not bring valuables to the hospital.  Contacts, dentures or bridgework may not be worn into surgery.  Leave suitcase in the car. After surgery it may be brought to your room.  For patients admitted to the hospital, checkout time is 11:00 AM the day of discharge                             Patients discharged the day of surgery will not be allowed to drive home. If going home same day of surgery, you must have someone stay with you the first 24 hours at home and arrange for some one to drive you home from hospital.    Special Instructions: See [facility] Preparing for Surgery instruction sheet. Women do not shave legs or underarms for 12 hours before showers. Men may shave face morning of surgery.    Please read over the following fact sheets that you were given: MRSA Information  [facility] pre op nurse phone number [phone], call if needed

## 2012-09-06 ENCOUNTER — Encounter (HOSPITAL_COMMUNITY)
Admission: RE | Admit: 2012-09-06 | Discharge: 2012-09-06 | Disposition: A | Discharge status: Home / Self Care | Payer: Medicare Other | Source: Ambulatory Visit | Attending: Urology | Admitting: Urology

## 2012-09-06 ENCOUNTER — Encounter (HOSPITAL_COMMUNITY): Payer: Self-pay

## 2012-09-06 HISTORY — DX: Other specified health status: Z78.9

## 2012-09-06 LAB — CBC
Hemoglobin: 11.9 g/dL — ABNORMAL LOW (ref 13.0–17.0)
MCH: 32.1 pg (ref 26.0–34.0)
MCHC: 33.7 g/dL (ref 30.0–36.0)
MCV: 95.1 fL (ref 78.0–100.0)
Platelets: 176 10*3/uL (ref 150–400)
RBC: 3.71 MIL/uL — ABNORMAL LOW (ref 4.22–5.81)

## 2012-09-06 LAB — SURGICAL PCR SCREEN: MRSA, PCR: NEGATIVE

## 2012-09-13 ENCOUNTER — Encounter (HOSPITAL_COMMUNITY): Payer: Self-pay | Admitting: Anesthesiology

## 2012-09-13 ENCOUNTER — Encounter (HOSPITAL_COMMUNITY): Payer: Self-pay | Admitting: *Deleted

## 2012-09-13 ENCOUNTER — Ambulatory Visit (HOSPITAL_COMMUNITY)
Admission: RE | Admit: 2012-09-13 | Discharge: 2012-09-13 | Disposition: A | Discharge status: Home / Self Care | Payer: Medicare Other | Source: Ambulatory Visit | Attending: Urology | Admitting: Urology

## 2012-09-13 ENCOUNTER — Ambulatory Visit (HOSPITAL_COMMUNITY): Payer: Medicare Other | Admitting: Anesthesiology

## 2012-09-13 ENCOUNTER — Encounter (HOSPITAL_COMMUNITY)
Admission: RE | Disposition: A | Discharge status: Home / Self Care | Payer: Self-pay | Source: Ambulatory Visit | Attending: Urology

## 2012-09-13 DIAGNOSIS — Z96659 Presence of unspecified artificial knee joint: Secondary | ICD-10-CM | POA: Insufficient documentation

## 2012-09-13 DIAGNOSIS — D494 Neoplasm of unspecified behavior of bladder: Secondary | ICD-10-CM

## 2012-09-13 DIAGNOSIS — Z01812 Encounter for preprocedural laboratory examination: Secondary | ICD-10-CM | POA: Insufficient documentation

## 2012-09-13 DIAGNOSIS — C674 Malignant neoplasm of posterior wall of bladder: Secondary | ICD-10-CM | POA: Insufficient documentation

## 2012-09-13 HISTORY — PX: TRANSURETHRAL RESECTION OF BLADDER TUMOR: SHX2575

## 2012-09-13 HISTORY — PX: CYSTOSCOPY: SHX5120

## 2012-09-13 SURGERY — CYSTOSCOPY
Anesthesia: General | Site: Bladder | Wound class: Clean Contaminated

## 2012-09-13 MED ORDER — STERILE WATER FOR IRRIGATION IR SOLN
Status: DC | PRN
  Administered 2012-09-13: 3000 mL via INTRAVESICAL

## 2012-09-13 MED ORDER — LIDOCAINE HCL 2 % EX GEL
CUTANEOUS | Status: AC
  Filled 2012-09-13: qty 10

## 2012-09-13 MED ORDER — MEPERIDINE HCL 50 MG/ML IJ SOLN: 6.2500 mg | INTRAMUSCULAR | Status: DC | PRN

## 2012-09-13 MED ORDER — FENTANYL CITRATE 0.05 MG/ML IJ SOLN: 25.0000 ug | INTRAMUSCULAR | Status: DC | PRN

## 2012-09-13 MED ORDER — HYOSCYAMINE SULFATE 0.125 MG PO TABS: 0.1250 mg | ORAL_TABLET | ORAL | Status: DC | PRN

## 2012-09-13 MED ORDER — GLYCOPYRROLATE 0.2 MG/ML IJ SOLN
INTRAMUSCULAR | Status: DC | PRN
  Administered 2012-09-13: 0.1 mg via INTRAVENOUS

## 2012-09-13 MED ORDER — PROMETHAZINE HCL 25 MG/ML IJ SOLN: 6.2500 mg | INTRAMUSCULAR | Status: DC | PRN

## 2012-09-13 MED ORDER — LACTATED RINGERS IV SOLN
INTRAVENOUS | Status: DC
  Administered 2012-09-13: 18:00:00 via INTRAVENOUS

## 2012-09-13 MED ORDER — BELLADONNA ALKALOIDS-OPIUM 16.2-60 MG RE SUPP
RECTAL | Status: DC | PRN
  Administered 2012-09-13: 1 via RECTAL

## 2012-09-13 MED ORDER — BELLADONNA ALKALOIDS-OPIUM 16.2-60 MG RE SUPP
RECTAL | Status: AC
  Filled 2012-09-13: qty 1

## 2012-09-13 MED ORDER — CEPHALEXIN 500 MG PO CAPS: 500.0000 mg | ORAL_CAPSULE | Freq: Two times a day (BID) | ORAL | Status: DC

## 2012-09-13 MED ORDER — ACETAMINOPHEN 10 MG/ML IV SOLN
INTRAVENOUS | Status: DC | PRN
  Administered 2012-09-13: 1000 mg via INTRAVENOUS

## 2012-09-13 MED ORDER — PHENAZOPYRIDINE HCL 100 MG PO TABS: 100.0000 mg | ORAL_TABLET | Freq: Three times a day (TID) | ORAL | Status: DC | PRN

## 2012-09-13 MED ORDER — LACTATED RINGERS IV SOLN
INTRAVENOUS | Status: DC | PRN
  Administered 2012-09-13: 17:00:00 via INTRAVENOUS

## 2012-09-13 MED ORDER — HYDROCODONE-ACETAMINOPHEN 5-325 MG PO TABS: 1.0000 | ORAL_TABLET | ORAL | Status: DC | PRN

## 2012-09-13 MED ORDER — CEFAZOLIN SODIUM-DEXTROSE 2-3 GM-% IV SOLR
2.0000 g | INTRAVENOUS | Status: AC
  Administered 2012-09-13: 2 g via INTRAVENOUS

## 2012-09-13 MED ORDER — FENTANYL CITRATE 0.05 MG/ML IJ SOLN
INTRAMUSCULAR | Status: DC | PRN
  Administered 2012-09-13 (×2): 25 ug via INTRAVENOUS
  Administered 2012-09-13: 50 ug via INTRAVENOUS

## 2012-09-13 MED ORDER — KETAMINE HCL 10 MG/ML IJ SOLN
INTRAMUSCULAR | Status: DC | PRN
  Administered 2012-09-13: 1 mg via INTRAVENOUS
  Administered 2012-09-13: 45 mg via INTRAVENOUS

## 2012-09-13 MED ORDER — METOCLOPRAMIDE HCL 5 MG/ML IJ SOLN
INTRAMUSCULAR | Status: DC | PRN
  Administered 2012-09-13: 10 mg via INTRAVENOUS

## 2012-09-13 MED ORDER — ACETAMINOPHEN 10 MG/ML IV SOLN
INTRAVENOUS | Status: AC
  Filled 2012-09-13: qty 100

## 2012-09-13 MED ORDER — PROPOFOL 10 MG/ML IV BOLUS
INTRAVENOUS | Status: DC | PRN
  Administered 2012-09-13: 90 mg via INTRAVENOUS

## 2012-09-13 MED ORDER — LACTATED RINGERS IV SOLN
INTRAVENOUS | Status: DC
  Administered 2012-09-13: 1000 mL via INTRAVENOUS

## 2012-09-13 MED ORDER — LIDOCAINE HCL 2 % EX GEL
CUTANEOUS | Status: DC | PRN
  Administered 2012-09-13: 1 via URETHRAL

## 2012-09-13 MED ORDER — SENNOSIDES-DOCUSATE SODIUM 8.6-50 MG PO TABS: 1.0000 | ORAL_TABLET | Freq: Two times a day (BID) | ORAL | Status: DC

## 2012-09-13 MED ORDER — CEFAZOLIN SODIUM-DEXTROSE 2-3 GM-% IV SOLR
INTRAVENOUS | Status: AC
  Filled 2012-09-13: qty 50

## 2012-09-13 MED ORDER — ONDANSETRON HCL 4 MG/2ML IJ SOLN
INTRAMUSCULAR | Status: DC | PRN
  Administered 2012-09-13: 4 mg via INTRAVENOUS

## 2012-09-13 SURGICAL SUPPLY — 22 items
BAG URINE DRAINAGE (UROLOGICAL SUPPLIES) IMPLANT
BAG URO CATCHER STRL LF (DRAPE) ×2 IMPLANT
CATH FOLEY 2WAY SLVR  5CC 20FR (CATHETERS) ×1
CATH FOLEY 2WAY SLVR 5CC 20FR (CATHETERS) IMPLANT
CLOTH BEACON ORANGE TIMEOUT ST (SAFETY) ×2 IMPLANT
DRAPE CAMERA CLOSED 9X96 (DRAPES) ×2 IMPLANT
ELECT REM PT RETURN 9FT ADLT (ELECTROSURGICAL) ×2
ELECTRODE REM PT RTRN 9FT ADLT (ELECTROSURGICAL) ×1 IMPLANT
EVACUATOR MICROVAS BLADDER (UROLOGICAL SUPPLIES) IMPLANT
GLOVE BIOGEL M 7.0 STRL (GLOVE) ×2 IMPLANT
GLOVE BIOGEL PI IND STRL 7.5 (GLOVE) ×2 IMPLANT
GLOVE BIOGEL PI INDICATOR 7.5 (GLOVE) ×2
GLOVE ECLIPSE 7.0 STRL STRAW (GLOVE) ×2 IMPLANT
GOWN PREVENTION PLUS XLARGE (GOWN DISPOSABLE) ×2 IMPLANT
GOWN STRL NON-REIN LRG LVL3 (GOWN DISPOSABLE) ×2 IMPLANT
KIT ASPIRATION TUBING (SET/KITS/TRAYS/PACK) IMPLANT
LOOPS RESECTOSCOPE DISP (ELECTROSURGICAL) ×2 IMPLANT
MANIFOLD NEPTUNE II (INSTRUMENTS) ×2 IMPLANT
PACK CYSTO (CUSTOM PROCEDURE TRAY) ×2 IMPLANT
SCRUB PCMX 4 OZ (MISCELLANEOUS) ×1 IMPLANT
SYRINGE IRR TOOMEY STRL 70CC (SYRINGE) IMPLANT
TUBING CONNECTING 10 (TUBING) ×2 IMPLANT

## 2012-09-13 NOTE — Anesthesia Preprocedure Evaluation (Signed)
Anesthesia Evaluation  Patient identified by MRN, date of birth, ID band Patient awake    Reviewed: Allergy & Precautions, H&P , NPO status , Patient's Chart, lab work & pertinent test results  Airway Mallampati: II TM Distance: >3 FB Neck ROM: Full    Dental No notable dental hx.    Pulmonary neg pulmonary ROS,  breath sounds clear to auscultation  Pulmonary exam normal       Cardiovascular negative cardio ROS  Rhythm:Regular Rate:Normal     Neuro/Psych negative neurological ROS  negative psych ROS   GI/Hepatic negative GI ROS, Neg liver ROS,   Endo/Other  negative endocrine ROS  Renal/GU negative Renal ROS  negative genitourinary   Musculoskeletal negative musculoskeletal ROS (+)   Abdominal   Peds negative pediatric ROS (+)  Hematology negative hematology ROS (+)   Anesthesia Other Findings   Reproductive/Obstetrics negative OB ROS                           Anesthesia Physical Anesthesia Plan  ASA: I  Anesthesia Plan: General   Post-op Pain Management:    Induction: Intravenous  Airway Management Planned: LMA  Additional Equipment:   Intra-op Plan:   Post-operative Plan: Extubation in OR  Informed Consent: I have reviewed the patients History and Physical, chart, labs and discussed the procedure including the risks, benefits and alternatives for the proposed anesthesia with the patient or authorized representative who has indicated his/her understanding and acceptance.   Dental advisory given  Plan Discussed with: CRNA  Anesthesia Plan Comments:         Anesthesia Quick Evaluation  

## 2012-09-13 NOTE — Op Note (Signed)
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  History of present illness: 78 year old male presented to the clinic with gross hematuria. Cystoscopy revealed bladder tumor. She presented to the OR today for transurethral resection of bladder tumor.   Procedure in detail: After informed consent was obtained, the patient was taken to the operating room. They were placed in the supine position. SCDs were turned on and in place. IV antibiotics were infused, and general anesthesia was induced. A timeout was performed in which the correct patient, surgical site, and procedure were identified and agreed upon by the team.  The patient was placed in a dorsolithotomy position, making sure to pad all pertinent neurovascular pressure points. The genitals were prepped and draped in the usual sterile fashion.   A cystoscope was advanced through the urethra and into the bladder. The bladder was fully distended and evaluated in a systematic fashion with a 12 and 70 lens. There was noted to be only one solitary papillary bladder tumor on the posterior bladder wall. Both ureteral orifices were identified and seen to be effluxing clear yellow urine. This tumor was noted to be larger than half a centimeter but smaller than 2 cm. A cold cup biopsy forcep was used to remove the murmur and several pieces. The tumor was completely removed, it was sent for permanent pathology. Bugbee electrode was then used to fulgurate the site where the tumor had been resected. The area around the tumor site was also fulgurated.  Completed  the procedure. 10 cc of lidocaine jelly were placed into his urethra. A Foley catheter was placed into his urethra and inflated with 10 cc of sterile water. Belladonna and opium suppository was placed into his rectum.  He did have a mobile nodule overlying his prostate. This felt to be extraprostatic. Completed the procedure. He was placed back in a supine position, anesthesia was reversed, and he was taken to the PACU in stable condition.

## 2012-09-13 NOTE — Transfer of Care (Signed)
Immediate Anesthesia Transfer of Care Note  Patient: Zachary Obrien  Procedure(s) Performed: Procedure(s) (LRB) with comments: CYSTOSCOPY (N/A) TRANSURETHRAL RESECTION OF BLADDER TUMOR (TURBT) (N/A) - fulguration of bladder tumor  Patient Location: PACU  Anesthesia Type:General  Level of Consciousness: awake, sedated and patient cooperative  Airway & Oxygen Therapy: Patient Spontanous Breathing and Patient connected to face mask oxygen  Post-op Assessment: Report given to PACU RN, Post -op Vital signs reviewed and stable and Patient moving all extremities  Post vital signs: Reviewed and stable  Complications: No apparent anesthesia complications

## 2012-09-13 NOTE — Preoperative (Signed)
Beta Blockers   Reason not to administer Beta Blockers:Not Applicable, not on home BB 

## 2012-09-13 NOTE — H&P (Signed)
Urology History and Physical Exam  CC: Bladder neoplasm  HPI:    78 -year-old male presents today with a bladder neoplasm. It was discovered on workup of gross hematuria. This is not been associated with fever. Cystoscopy in clinic reveals a 0.5 cm bladder tumor on the dome of the bladder. No other tumors elsewhere. He had a CT hematuria protocol from 08/04/12. It revealed multiple bilateral renal cysts which were simple. There were no filling defects in the ureters or upper urinary tract, and there were no urinary stones or enhancing renal masses. He presents today for transurethral resection of bladder tumor. We have discussed the risks, benefits, alternatives, and likelihood of achieving goals.  PMH: Past Medical History  Diagnosis Date  . No pertinent past medical history     PSH: Past Surgical History  Procedure Date  . Tonsillectomy age 78  . Hydrocele surgery age 56  . Eye surgery age 36    partial cornea tranplants  . Bilatera lens replacement sfor cataracts age 64  . Detached retina surgery left eye age 89  . Joint replacement 2010    right knee replacement    Allergies: No Known Allergies  Medications: No prescriptions prior to admission     Social History: History   Social History  . Marital Status: Widowed    Spouse Name: N/A    Number of Children: N/A  . Years of Education: N/A   Occupational History  . Not on file.   Social History Main Topics  . Smoking status: Former Smoker -- 2.0 packs/day for 20 years    Types: Cigarettes    Quit date: 11/08/1965  . Smokeless tobacco: Never Used  . Alcohol Use: 1.2 oz/week    2 Glasses of wine per week  . Drug Use: No  . Sexually Active: Not on file   Other Topics Concern  . Not on file   Social History Narrative  . No narrative on file    Family History: No family history on file.  Review of Systems: Positive: None. Negative: Chest pain, SOB, gross hematuria.  A further 10 point review of systems  was negative except what is listed in the HPI.  Physical Exam: Filed Vitals:   09/13/12 1259  BP: 160/87  Pulse: 99  Temp: 97.7 F (36.5 C)  Resp: 18    General: No acute distress.  Awake. Head:  Normocephalic.  Atraumatic. ENT:  EOMI.  Mucous membranes moist Neck:  Supple.  No lymphadenopathy. Pulmonary: Equal effort bilaterally.  Clear to auscultation bilaterally. Abdomen: Soft.  Non- tender to palpation. Skin:  Normal turgor.  No visible rash. Extremity: No gross deformity of bilateral upper extremities.  No gross deformity of    bilateral lower extremities. Neurologic: Alert. Appropriate mood.    Studies:  No results found for this basename: HGB:2,WBC:2,PLT:2 in the last 72 hours  No results found for this basename: NA:2,K:2,CL:2,CO2:2,BUN:2,CREATININE:2,CALCIUM:2,MAGNESIUM:2,GFRNONAA:2,GFRAA:2 in the last 72 hours   No results found for this basename: PT:2,INR:2,APTT:2 in the last 72 hours   No components found with this basename: ABG:2    Assessment:  Bladder neoplasm.   Plan: To OR for cystoscopy and transurethral resection of bladder tumor.

## 2012-09-13 NOTE — Anesthesia Postprocedure Evaluation (Signed)
  Anesthesia Post-op Note  Patient: Zachary Obrien  Procedure(s) Performed: Procedure(s) (LRB): CYSTOSCOPY (N/A) TRANSURETHRAL RESECTION OF BLADDER TUMOR (TURBT) (N/A)  Patient Location: PACU  Anesthesia Type: General  Level of Consciousness: awake and alert   Airway and Oxygen Therapy: Patient Spontanous Breathing  Post-op Pain: mild  Post-op Assessment: Post-op Vital signs reviewed, Patient's Cardiovascular Status Stable, Respiratory Function Stable, Patent Airway and No signs of Nausea or vomiting  Post-op Vital Signs: stable  Complications: No apparent anesthesia complications

## 2012-09-14 ENCOUNTER — Encounter (HOSPITAL_COMMUNITY): Payer: Self-pay | Admitting: Urology

## 2012-09-19 ENCOUNTER — Other Ambulatory Visit: Payer: Self-pay | Admitting: Urology

## 2012-10-18 ENCOUNTER — Encounter (HOSPITAL_COMMUNITY): Payer: Self-pay | Admitting: Pharmacy Technician

## 2012-10-19 ENCOUNTER — Encounter (HOSPITAL_COMMUNITY): Payer: Self-pay

## 2012-10-19 ENCOUNTER — Encounter (HOSPITAL_COMMUNITY)
Admission: RE | Admit: 2012-10-19 | Discharge: 2012-10-19 | Disposition: A | Discharge status: Home / Self Care | Payer: Medicare Other | Source: Ambulatory Visit | Attending: Urology | Admitting: Urology

## 2012-10-19 HISTORY — DX: Malignant neoplasm of bladder, unspecified: C67.9

## 2012-10-19 LAB — SURGICAL PCR SCREEN: MRSA, PCR: NEGATIVE

## 2012-10-19 LAB — CBC
Hemoglobin: 11.9 g/dL — ABNORMAL LOW (ref 13.0–17.0)
MCH: 31.7 pg (ref 26.0–34.0)
MCHC: 33.7 g/dL (ref 30.0–36.0)

## 2012-10-19 LAB — BASIC METABOLIC PANEL
BUN: 19 mg/dL (ref 6–23)
Calcium: 9.5 mg/dL (ref 8.4–10.5)
Creatinine, Ser: 1.05 mg/dL (ref 0.50–1.35)
GFR calc non Af Amer: 61 mL/min — ABNORMAL LOW (ref >90)
Glucose, Bld: 104 mg/dL — ABNORMAL HIGH (ref 70–99)
Sodium: 137 mEq/L (ref 135–145)

## 2012-10-19 NOTE — Patient Instructions (Signed)
Zachary Obrien  10/19/2012                           YOUR PROCEDURE IS SCHEDULED ON:  10/25/12               PLEASE REPORT TO SHORT STAY CENTER AT :  9:30 AM               CALL THIS NUMBER IF ANY PROBLEMS THE DAY OF SURGERY :               [phone]                      REMEMBER:   Do not eat food or drink liquids AFTER MIDNIGHT   Take these medicines the morning of surgery with A SIP OF WATER:  PROSCAR   Do not wear jewelry, make-up   Do not wear lotions, powders, or perfumes.   Do not shave legs or underarms 12 hrs. before surgery (men may shave face)  Do not bring valuables to the hospital.  Contacts, dentures or bridgework may not be worn into surgery.  Leave suitcase in the car. After surgery it may be brought to your room.  For patients admitted to the hospital more than one night, checkout time is 11:00                          The day of discharge.   Patients discharged the day of surgery will not be allowed to drive home                             If going home same day of surgery, must have someone stay with you first                           24 hrs at home and arrange for some one to drive you home from hospital.    Special Instructions:   Please read over the following fact sheets that you were given:               1. MRSA  INFORMATION                      2. [facility] PREPARING FOR SURGERY SHEET                                                X_____________________________________________________________________        Failure to follow these instructions may result in cancellation of your surgery

## 2012-10-25 ENCOUNTER — Encounter (HOSPITAL_COMMUNITY)
Admission: RE | Disposition: A | Discharge status: Home / Self Care | Payer: Self-pay | Source: Ambulatory Visit | Attending: Urology

## 2012-10-25 ENCOUNTER — Encounter (HOSPITAL_COMMUNITY): Payer: Self-pay | Admitting: Anesthesiology

## 2012-10-25 ENCOUNTER — Ambulatory Visit (HOSPITAL_COMMUNITY): Payer: Medicare Other | Admitting: Anesthesiology

## 2012-10-25 ENCOUNTER — Encounter (HOSPITAL_COMMUNITY): Payer: Self-pay | Admitting: *Deleted

## 2012-10-25 ENCOUNTER — Ambulatory Visit (HOSPITAL_COMMUNITY)
Admission: RE | Admit: 2012-10-25 | Discharge: 2012-10-25 | Disposition: A | Discharge status: Home / Self Care | Payer: Medicare Other | Source: Ambulatory Visit | Attending: Urology | Admitting: Urology

## 2012-10-25 DIAGNOSIS — Z96659 Presence of unspecified artificial knee joint: Secondary | ICD-10-CM | POA: Insufficient documentation

## 2012-10-25 DIAGNOSIS — C674 Malignant neoplasm of posterior wall of bladder: Secondary | ICD-10-CM | POA: Insufficient documentation

## 2012-10-25 DIAGNOSIS — C679 Malignant neoplasm of bladder, unspecified: Secondary | ICD-10-CM

## 2012-10-25 DIAGNOSIS — Z01812 Encounter for preprocedural laboratory examination: Secondary | ICD-10-CM | POA: Insufficient documentation

## 2012-10-25 HISTORY — PX: CYSTOSCOPY WITH BIOPSY: SHX5122

## 2012-10-25 SURGERY — CYSTOSCOPY, WITH BIOPSY
Anesthesia: General | Site: Bladder | Wound class: Clean Contaminated

## 2012-10-25 MED ORDER — CEFAZOLIN SODIUM-DEXTROSE 2-3 GM-% IV SOLR
INTRAVENOUS | Status: AC
  Filled 2012-10-25: qty 50

## 2012-10-25 MED ORDER — PROMETHAZINE HCL 25 MG/ML IJ SOLN: 6.2500 mg | INTRAMUSCULAR | Status: DC | PRN

## 2012-10-25 MED ORDER — FENTANYL CITRATE 0.05 MG/ML IJ SOLN
INTRAMUSCULAR | Status: DC | PRN
  Administered 2012-10-25: 25 ug via INTRAVENOUS
  Administered 2012-10-25: 50 ug via INTRAVENOUS
  Administered 2012-10-25: 25 ug via INTRAVENOUS

## 2012-10-25 MED ORDER — PHENAZOPYRIDINE HCL 100 MG PO TABS: 100.0000 mg | ORAL_TABLET | Freq: Three times a day (TID) | ORAL | Status: DC | PRN

## 2012-10-25 MED ORDER — INDIGOTINDISULFONATE SODIUM 8 MG/ML IJ SOLN
INTRAMUSCULAR | Status: AC
  Filled 2012-10-25: qty 5

## 2012-10-25 MED ORDER — HYOSCYAMINE SULFATE 0.125 MG PO TABS: 0.1250 mg | ORAL_TABLET | ORAL | Status: DC | PRN

## 2012-10-25 MED ORDER — SENNOSIDES-DOCUSATE SODIUM 8.6-50 MG PO TABS: 1.0000 | ORAL_TABLET | Freq: Two times a day (BID) | ORAL | Status: DC

## 2012-10-25 MED ORDER — HYDROCODONE-ACETAMINOPHEN 5-325 MG PO TABS: 1.0000 | ORAL_TABLET | ORAL | Status: DC | PRN

## 2012-10-25 MED ORDER — ACETAMINOPHEN 10 MG/ML IV SOLN
INTRAVENOUS | Status: DC | PRN
  Administered 2012-10-25: 1000 mg via INTRAVENOUS

## 2012-10-25 MED ORDER — CEFDINIR 300 MG PO CAPS: 300.0000 mg | ORAL_CAPSULE | Freq: Two times a day (BID) | ORAL | Status: DC

## 2012-10-25 MED ORDER — STERILE WATER FOR IRRIGATION IR SOLN
Status: DC | PRN
  Administered 2012-10-25: 3000 mL via INTRAVESICAL

## 2012-10-25 MED ORDER — IOHEXOL 300 MG/ML  SOLN
INTRAMUSCULAR | Status: AC
  Filled 2012-10-25: qty 1

## 2012-10-25 MED ORDER — CEFAZOLIN SODIUM-DEXTROSE 2-3 GM-% IV SOLR
2.0000 g | INTRAVENOUS | Status: AC
  Administered 2012-10-25: 2 g via INTRAVENOUS

## 2012-10-25 MED ORDER — LACTATED RINGERS IV SOLN
INTRAVENOUS | Status: DC
  Administered 2012-10-25: 1000 mL via INTRAVENOUS

## 2012-10-25 MED ORDER — FENTANYL CITRATE 0.05 MG/ML IJ SOLN: 25.0000 ug | INTRAMUSCULAR | Status: DC | PRN

## 2012-10-25 MED ORDER — BELLADONNA ALKALOIDS-OPIUM 16.2-60 MG RE SUPP
RECTAL | Status: AC
  Filled 2012-10-25: qty 1

## 2012-10-25 MED ORDER — ONDANSETRON HCL 4 MG/2ML IJ SOLN
INTRAMUSCULAR | Status: DC | PRN
  Administered 2012-10-25: 4 mg via INTRAVENOUS

## 2012-10-25 MED ORDER — ACETAMINOPHEN 10 MG/ML IV SOLN
INTRAVENOUS | Status: AC
  Filled 2012-10-25: qty 100

## 2012-10-25 MED ORDER — LACTATED RINGERS IV SOLN
INTRAVENOUS | Status: DC | PRN
  Administered 2012-10-25: 11:00:00 via INTRAVENOUS

## 2012-10-25 MED ORDER — PROPOFOL 10 MG/ML IV BOLUS
INTRAVENOUS | Status: DC | PRN
  Administered 2012-10-25: 150 mg via INTRAVENOUS

## 2012-10-25 SURGICAL SUPPLY — 19 items
BAG URINE DRAINAGE (UROLOGICAL SUPPLIES) ×1 IMPLANT
BAG URO CATCHER STRL LF (DRAPE) ×2 IMPLANT
CATH FOLEY 2W COUNCIL 5CC 16FR (CATHETERS) ×1 IMPLANT
CATH ROBINSON RED A/P 16FR (CATHETERS) IMPLANT
CLOTH BEACON ORANGE TIMEOUT ST (SAFETY) ×2 IMPLANT
DRAPE CAMERA CLOSED 9X96 (DRAPES) ×2 IMPLANT
ELECT REM PT RETURN 9FT ADLT (ELECTROSURGICAL) ×2
ELECTRODE REM PT RTRN 9FT ADLT (ELECTROSURGICAL) ×1 IMPLANT
GLOVE BIOGEL M 7.0 STRL (GLOVE) ×2 IMPLANT
GOWN PREVENTION PLUS XLARGE (GOWN DISPOSABLE) ×4 IMPLANT
GOWN STRL NON-REIN LRG LVL3 (GOWN DISPOSABLE) ×2 IMPLANT
MANIFOLD NEPTUNE II (INSTRUMENTS) ×2 IMPLANT
NDL SAFETY ECLIPSE 18X1.5 (NEEDLE) IMPLANT
NEEDLE HYPO 18GX1.5 SHARP (NEEDLE)
NEEDLE HYPO 22GX1.5 SAFETY (NEEDLE) IMPLANT
PACK CYSTO (CUSTOM PROCEDURE TRAY) ×2 IMPLANT
SCRUB PCMX 4 OZ (MISCELLANEOUS) ×2 IMPLANT
TUBING CONNECTING 10 (TUBING) ×1 IMPLANT
WATER STERILE IRR 3000ML UROMA (IV SOLUTION) ×3 IMPLANT

## 2012-10-25 NOTE — Anesthesia Postprocedure Evaluation (Signed)
  Anesthesia Post-op Note  Patient: Zachary Obrien  Procedure(s) Performed: Procedure(s) (LRB): CYSTOSCOPY WITH BIOPSY (N/A)  Patient Location: PACU  Anesthesia Type: General  Level of Consciousness: awake and alert   Airway and Oxygen Therapy: Patient Spontanous Breathing  Post-op Pain: mild  Post-op Assessment: Post-op Vital signs reviewed, Patient's Cardiovascular Status Stable, Respiratory Function Stable, Patent Airway and No signs of Nausea or vomiting  Last Vitals:  Filed Vitals:   10/25/12 0950  BP: 150/78  Pulse: 99  Temp: 36.1 C  Resp: 18    Post-op Vital Signs: stable   Complications: No apparent anesthesia complications

## 2012-10-25 NOTE — Anesthesia Preprocedure Evaluation (Signed)
Anesthesia Evaluation  Patient identified by MRN, date of birth, ID band Patient awake    Reviewed: Allergy & Precautions, H&P , NPO status , Patient's Chart, lab work & pertinent test results  Airway Mallampati: II TM Distance: >3 FB Neck ROM: Limited    Dental No notable dental hx.    Pulmonary former smoker,  breath sounds clear to auscultation  Pulmonary exam normal       Cardiovascular negative cardio ROS  Rhythm:Regular Rate:Normal     Neuro/Psych negative neurological ROS  negative psych ROS   GI/Hepatic negative GI ROS, Neg liver ROS,   Endo/Other  negative endocrine ROS  Renal/GU negative Renal ROS  negative genitourinary   Musculoskeletal negative musculoskeletal ROS (+)   Abdominal   Peds negative pediatric ROS (+)  Hematology negative hematology ROS (+)   Anesthesia Other Findings   Reproductive/Obstetrics negative OB ROS                           Anesthesia Physical Anesthesia Plan  ASA: II  Anesthesia Plan: General   Post-op Pain Management:    Induction: Intravenous  Airway Management Planned: LMA  Additional Equipment:   Intra-op Plan:   Post-operative Plan:   Informed Consent: I have reviewed the patients History and Physical, chart, labs and discussed the procedure including the risks, benefits and alternatives for the proposed anesthesia with the patient or authorized representative who has indicated his/her understanding and acceptance.   Dental advisory given  Plan Discussed with: CRNA and Surgeon  Anesthesia Plan Comments:         Anesthesia Quick Evaluation

## 2012-10-25 NOTE — Transfer of Care (Signed)
Immediate Anesthesia Transfer of Care Note  Patient: Zachary Obrien  Procedure(s) Performed: Procedure(s) (LRB) with comments: CYSTOSCOPY WITH BIOPSY (N/A)  Patient Location: PACU  Anesthesia Type:General  Level of Consciousness: awake and alert   Airway & Oxygen Therapy: Patient Spontanous Breathing and Patient connected to face mask oxygen  Post-op Assessment: Report given to PACU RN and Post -op Vital signs reviewed and unstable, Anesthesiologist notified  Post vital signs: Reviewed and stable  Complications: No apparent anesthesia complications

## 2012-10-25 NOTE — Op Note (Signed)
Urology Operative Report  Date of Procedure: 10/25/12  Surgeon: Natalia Leatherwood, MD Assistant: None  Preoperative Diagnosis: Bladder cancer Postoperative Diagnosis: Same  Procedure(s): Cystoscopy Bladder biopsy  Estimated blood loss: None  Specimen: Deep and superficial bladder biopsy.  Drains: Foley  Complications: None  Findings: No papillary tumors.   History of present illness: 78 year old male presents today with history of bladder cancer. This was found to be high-grade. Pathology returned as no muscle present and therefore we are elected to repeat bladder biopsy to obtain deeper specimen to ensure appropriate staging. He presents today for that biopsy.   Procedure in detail: After informed consent was obtained, the patient was taken to the operating room. They were placed in the supine position. SCDs were turned on and in place. IV antibiotics were infused, and general anesthesia was induced. A timeout was performed in which the correct patient, surgical site, and procedure were identified and agreed upon by the team.  The patient was placed in a dorsolithotomy position, making sure to pad all pertinent neurovascular pressure points. The genitals were prepped and draped in the usual sterile fashion.   A 30 lens cystoscope was advanced through the urethra and into the bladder. The bladder was drained and then fully distended and evaluated in a systematic fashion with a 30 and 70 lens. There were no papillary bladder lesions noted. There were no other bladder lesions noted. The site of the previous bladder tumor was located on the posterior bladder wall. Cold cup biopsy forceps were used to obtain superficial specimens overlying this area of previous bladder tumor. These were sent as one set of the specimen. Next deeper biopsies were taken when I was able to see muscle fibers and these muscle fibers were biopsied and sent as deeper labs he specimen. Bugbee electrocautery was  then used to fulgurate the area. Good hemostasis was achieved.  A 16 French catheter was placed with 10 cc of sterile water in the balloon as the patient had urinary retention following his last transurethral surgery.  This completed the procedure. Anesthesia was reversed and he was placed back into a supine position. He was taken to the PACU in stable condition.   I will discuss with the patient whether he would like to be discharged home with the catheter versus have it removed and try voiding trial following surgery today.  He has followup 10/30/12 to discuss the results of the biopsy with me.

## 2012-10-25 NOTE — Progress Notes (Signed)
Catheter connected to leg bag. And pt instructed on how to empty leg bag. Cath draining pink urine. 200cc pink urine emptied from cath bag

## 2012-10-25 NOTE — H&P (Signed)
Urology History and Physical Exam  CC: Bladder cancer  HPI: 78 year old male with bladder cancer presents for cystoscopy and bladder biopsy. This was discovered on work up for gross hematuria. He had a TURBT 09/13/12 which showed urothelial carcinoma which was high grade, but no muscle was present in the specimen. He had upper tract imaging which was negative on 08/04/12. His tumor was located on the dome of his bladder. UA 10/11/12 was negative for signs of infection.  PMH: Past Medical History  Diagnosis Date  . No pertinent past medical history   . Bladder cancer     PSH: Past Surgical History  Procedure Date  . Tonsillectomy age 89  . Hydrocele surgery age 18  . Eye surgery age 68    partial cornea tranplants  . Bilatera lens replacement sfor cataracts age 89  . Detached retina surgery left eye age 62  . Joint replacement 2010    right knee replacement  . Cystoscopy 09/13/2012    Procedure: CYSTOSCOPY;  Surgeon: Milford Cage, MD;  Location: [facility];  Service: Urology;  Laterality: N/A;  . Transurethral resection of bladder tumor 09/13/2012    Procedure: TRANSURETHRAL RESECTION OF BLADDER TUMOR (TURBT);  Surgeon: Milford Cage, MD;  Location: [facility];  Service: Urology;  Laterality: N/A;  fulguration of bladder tumor    Allergies: No Known Allergies  Medications: No prescriptions prior to admission     Social History: History   Social History  . Marital Status: Widowed    Spouse Name: N/A    Number of Children: N/A  . Years of Education: N/A   Occupational History  . Not on file.   Social History Main Topics  . Smoking status: Former Smoker -- 2.0 packs/day for 20 years    Types: Cigarettes    Quit date: 11/08/1965  . Smokeless tobacco: Never Used  . Alcohol Use: 1.2 oz/week    2 Glasses of wine per week  . Drug Use: No  . Sexually Active: Not on file   Other Topics Concern  . Not on file   Social History Narrative  . No narrative on file     Family History: No family history on file.  Review of Systems: Positive: Dysuria. Negative: Chest pain, SOB, or fever.  A further 10 point review of systems was negative except what is listed in the HPI.  Physical Exam: Filed Vitals:   10/25/12 0950  BP: 150/78  Pulse: 99  Temp: 97 F (36.1 C)  Resp: 18    General: No acute distress.  Awake. Head:  Normocephalic.  Atraumatic. ENT:  EOMI.  Mucous membranes moist Neck:  Supple.  No lymphadenopathy. CV:  S1 present. S2 present. Regular rate. Pulmonary: Equal effort bilaterally.  Clear to auscultation bilaterally. Abdomen: Soft.  Non- tender to palpation. Skin:  Normal turgor.  No visible rash. Extremity: No gross deformity of bilateral upper extremities.  No gross deformity of    bilateral lower extremities. Neurologic: Alert. Appropriate mood.    Studies:  No results found for this basename: HGB:2,WBC:2,PLT:2 in the last 72 hours  No results found for this basename: NA:2,K:2,CL:2,CO2:2,BUN:2,CREATININE:2,CALCIUM:2,MAGNESIUM:2,GFRNONAA:2,GFRAA:2 in the last 72 hours   No results found for this basename: PT:2,INR:2,APTT:2 in the last 72 hours   No components found with this basename: ABG:2    Assessment:  Bladder cancer  Plan: To the OR for cystoscopy and bladder biopsy.

## 2012-10-26 ENCOUNTER — Encounter (HOSPITAL_COMMUNITY): Payer: Self-pay | Admitting: Urology

## 2012-11-15 ENCOUNTER — Other Ambulatory Visit: Payer: Self-pay | Admitting: Internal Medicine

## 2012-11-15 DIAGNOSIS — D229 Melanocytic nevi, unspecified: Secondary | ICD-10-CM

## 2012-11-15 HISTORY — DX: Melanocytic nevi, unspecified: D22.9

## 2012-11-29 ENCOUNTER — Other Ambulatory Visit: Payer: Self-pay | Admitting: Internal Medicine

## 2013-03-28 ENCOUNTER — Ambulatory Visit (INDEPENDENT_AMBULATORY_CARE_PROVIDER_SITE_OTHER): Payer: Medicare Other | Admitting: Family Medicine

## 2013-03-28 VITALS — BP 118/60 | HR 79 | Temp 98.5°F | Resp 16 | Ht 70.0 in | Wt 172.0 lb

## 2013-03-28 DIAGNOSIS — S61509A Unspecified open wound of unspecified wrist, initial encounter: Secondary | ICD-10-CM

## 2013-03-28 DIAGNOSIS — IMO0002 Reserved for concepts with insufficient information to code with codable children: Secondary | ICD-10-CM

## 2013-03-28 DIAGNOSIS — M25549 Pain in joints of unspecified hand: Secondary | ICD-10-CM

## 2013-03-28 DIAGNOSIS — S61512A Laceration without foreign body of left wrist, initial encounter: Secondary | ICD-10-CM

## 2013-03-28 DIAGNOSIS — T07XXXA Unspecified multiple injuries, initial encounter: Secondary | ICD-10-CM

## 2013-03-28 DIAGNOSIS — Z23 Encounter for immunization: Secondary | ICD-10-CM

## 2013-03-28 NOTE — Progress Notes (Signed)
Is an 79 year old gentleman who tripped at the [facility] on [facility] today skinnning his arms, hands, fingers, knees.  Unsure of last Dt.  Moving all joints without problem.  No joint tenderness.  Objective:  No acute distress, thin elderly gentleman.  Examination left arm reveals a long, superficial abrasion along the ulna. He has a deeper laceration of the dorsal left wrist. He has small abrasion on the left middle finger.  Examination of the right hand reveals a superficial laceration with skin avulsion over the dorsal thenar region. He's able to make a complete fist and move his wrist without problem.  Examination of both knees reveals 1 cm sized superficial abrasions.  Patient is alert and has no evidence of trauma to his head. He is appropriate with good eye contact. Neck range of motion is normal.  Palpation of the chest and abdomen reveals no tenderness.  Range of motion of both arms legs wrists fingers is normal.  The left dorsal wrist was Dermabond it in the other areas were Steri-Stripped. Dressings were applied to each of these areas. The finger was washed and then a Band-Aid was applied.  A  TdaP was administered.  Assessment: Multiple superficial wounds and skin avulsions, one of which was mildly deeper on the left wrist. Aref plan: Return if redness, swelling or increased pain occur. Otherwise begin gentle bathing on Friday and followup with his personal care Dr., Dr. Johnella Moloney, next week

## 2013-03-28 NOTE — Patient Instructions (Addendum)
Keep all wounds dry until Friday morning. At that time he may begin gentle bathing after which he can blot dry the arm and knee abrasions and reapply a dry dressing. This should be repeated each morning for the next week until the skin has a chance to heal.  He should return if you see any redness, increasing swelling, increasing pain in any of these areas. Otherwise followup with Dr. Kevan Ny.

## 2013-05-16 ENCOUNTER — Other Ambulatory Visit: Payer: Self-pay | Admitting: Dermatology

## 2013-07-30 ENCOUNTER — Encounter: Payer: Self-pay | Admitting: *Deleted

## 2013-07-30 DIAGNOSIS — B351 Tinea unguium: Secondary | ICD-10-CM | POA: Insufficient documentation

## 2013-08-09 ENCOUNTER — Ambulatory Visit: Payer: Medicare Other | Admitting: Podiatry

## 2013-08-23 ENCOUNTER — Encounter: Payer: Self-pay | Admitting: Podiatry

## 2013-08-23 ENCOUNTER — Ambulatory Visit (INDEPENDENT_AMBULATORY_CARE_PROVIDER_SITE_OTHER): Payer: Medicare Other | Admitting: Podiatry

## 2013-08-23 DIAGNOSIS — B351 Tinea unguium: Secondary | ICD-10-CM

## 2013-08-23 DIAGNOSIS — M79609 Pain in unspecified limb: Secondary | ICD-10-CM

## 2013-08-23 NOTE — Progress Notes (Signed)
Subjective:     Patient ID: Zachary Obrien, male   DOB: [birth year], 79 y.o.   MRN: [id number]  HPI patient states my nails are bothering me and I would like to have them taken care of I cannot do it myself   Review of Systems  All other systems reviewed and are negative.       Objective:   Physical Exam  Nursing note and vitals reviewed. Cardiovascular: Intact distal pulses.   Skin: Skin is warm.   Thick mycotic nail infections 1-5 bilateral with pain when pressed dorsally    Assessment:     Mycotic nail infection 1-5 bilateral with pain    Plan:     Debridement nailbeds 1-5 bilateral no iatrogenic bleeding noted

## 2013-11-22 ENCOUNTER — Ambulatory Visit: Payer: Medicare Other | Admitting: Podiatry

## 2013-12-13 ENCOUNTER — Encounter: Payer: Self-pay | Admitting: Podiatry

## 2013-12-13 ENCOUNTER — Ambulatory Visit (INDEPENDENT_AMBULATORY_CARE_PROVIDER_SITE_OTHER): Payer: Medicare Other | Admitting: Podiatry

## 2013-12-13 VITALS — BP 145/89 | HR 76 | Resp 16

## 2013-12-13 DIAGNOSIS — M79609 Pain in unspecified limb: Secondary | ICD-10-CM

## 2013-12-13 DIAGNOSIS — B351 Tinea unguium: Secondary | ICD-10-CM

## 2013-12-13 NOTE — Progress Notes (Signed)
Subjective:     Patient ID: Zachary Obrien, male   DOB: [birth year], 80 y.o.   MRN: [id number]  HPI patient presents stating I have painful nailbeds of both feet that I can not cut myself   Review of Systems     Objective:   Physical Exam Neurovascular status unchanged with thick painful nailbeds 1-5 of both feet    Assessment:     Mycotic nails and a painful 1-5 both feet    Plan:     Debridement painful nail bed 1-5 both feet with no bleeding noted

## 2014-03-14 ENCOUNTER — Ambulatory Visit: Payer: Medicare Other | Admitting: Podiatry

## 2014-03-28 ENCOUNTER — Ambulatory Visit (INDEPENDENT_AMBULATORY_CARE_PROVIDER_SITE_OTHER): Payer: Medicare Other | Admitting: Podiatry

## 2014-03-28 ENCOUNTER — Encounter: Payer: Self-pay | Admitting: Podiatry

## 2014-03-28 VITALS — BP 135/90 | HR 90 | Resp 16

## 2014-03-28 DIAGNOSIS — B351 Tinea unguium: Secondary | ICD-10-CM

## 2014-03-28 DIAGNOSIS — M79609 Pain in unspecified limb: Secondary | ICD-10-CM

## 2014-03-28 NOTE — Progress Notes (Signed)
Subjective:     Patient ID: Zachary Obrien, male   DOB: 12-09-1920, 80 y.o.   MRN: [id number]  HPI patient is found to have nail disease with thick this and yellow discoloration 1-5 both feet that are painful and he cannot cut   Review of Systems     Objective:   Physical Exam Neurovascular status intact with thick incurvated nail bed 1-5 both feet are brittle yellow elongated and painful    Assessment:    mycotic nail infection with pain 1-5 both feet    Plan:     Debridement mycotic nails 1-5 both feet with no iatrogenic bleeding noted

## 2014-04-22 ENCOUNTER — Telehealth: Payer: Self-pay | Admitting: *Deleted

## 2014-04-22 NOTE — Telephone Encounter (Signed)
Two weeks ago I was there.  There's 2 medicines on my sheet that I don't take.  I took one 4 years ago and the other I never used.  I'd like to get that straightened out.  I attempted to return his call.  I left a message to call me back with the names of the medication he wanted me to remove from his record.

## 2014-04-24 NOTE — Telephone Encounter (Signed)
I'm calling in regards to the medicine I want taken off my list.  One is Oxycodeine (hydro), I took that 5 years ago when I had a knee replacement.  The other is Hydrosulnate? It was for bladder spasms.  I never took that, I got it but never used it.  I'd like to get those 2 removed from my list of medicines.  I deleted the Hydrocodone Acetaminophen and the Hyoscyamine off the patient's list.

## 2014-06-05 ENCOUNTER — Other Ambulatory Visit: Payer: Self-pay | Admitting: Internal Medicine

## 2014-06-05 ENCOUNTER — Ambulatory Visit
Admission: RE | Admit: 2014-06-05 | Discharge: 2014-06-05 | Disposition: A | Discharge status: Home / Self Care | Payer: Medicare Other | Source: Ambulatory Visit | Attending: Internal Medicine | Admitting: Internal Medicine

## 2014-06-05 DIAGNOSIS — R0989 Other specified symptoms and signs involving the circulatory and respiratory systems: Secondary | ICD-10-CM

## 2014-07-04 ENCOUNTER — Ambulatory Visit (INDEPENDENT_AMBULATORY_CARE_PROVIDER_SITE_OTHER): Payer: Medicare Other | Admitting: Podiatry

## 2014-07-04 DIAGNOSIS — B351 Tinea unguium: Secondary | ICD-10-CM

## 2014-07-04 DIAGNOSIS — M79673 Pain in unspecified foot: Secondary | ICD-10-CM

## 2014-07-04 DIAGNOSIS — M79609 Pain in unspecified limb: Secondary | ICD-10-CM

## 2014-07-04 NOTE — Progress Notes (Signed)
   Subjective:    Patient ID: Zachary Obrien, male    DOB: 1921/02/26, 80 y.o.   MRN: [id number]  HPI  Pt is here for debridement of nails   Review of Systems     Objective:   Physical Exam        Assessment & Plan:

## 2014-07-04 NOTE — Progress Notes (Signed)
Subjective:     Patient ID: Zachary Obrien, male   DOB: 11/04/1921, 80 y.o.   MRN: [id number]  HPI patient presents with nail disease thickness and pain 1-5 both feet that he cannot cut   Review of Systems     Objective:   Physical Exam Neurovascular status intact with thick yellow brittle nailbeds 1-5 both feet    Assessment:     Mycotic nail infection is with pain 1-5 both feet    Plan:     Debride painful nailbeds 1-5 both feet with no iatrogenic bleeding noted      

## 2014-10-10 ENCOUNTER — Other Ambulatory Visit: Payer: Medicare Other

## 2014-10-14 ENCOUNTER — Other Ambulatory Visit: Payer: Medicare Other

## 2014-10-14 ENCOUNTER — Encounter: Payer: Self-pay | Admitting: Podiatry

## 2014-10-14 ENCOUNTER — Ambulatory Visit (INDEPENDENT_AMBULATORY_CARE_PROVIDER_SITE_OTHER): Payer: Medicare Other | Admitting: Podiatry

## 2014-10-14 DIAGNOSIS — M79673 Pain in unspecified foot: Secondary | ICD-10-CM

## 2014-10-14 DIAGNOSIS — B351 Tinea unguium: Secondary | ICD-10-CM

## 2015-01-13 ENCOUNTER — Ambulatory Visit (INDEPENDENT_AMBULATORY_CARE_PROVIDER_SITE_OTHER): Payer: Medicare Other | Admitting: Podiatry

## 2015-01-13 DIAGNOSIS — B351 Tinea unguium: Secondary | ICD-10-CM | POA: Diagnosis not present

## 2015-01-13 DIAGNOSIS — M79673 Pain in unspecified foot: Secondary | ICD-10-CM | POA: Diagnosis not present

## 2015-01-13 NOTE — Progress Notes (Signed)
Subjective:     Patient ID: Zachary Obrien, male   DOB: 01/20/1921, 81 y.o.   MRN: [id number]  HPI patient presents with nail disease thickness and pain 1-5 both feet that he cannot cut   Review of Systems     Objective:   Physical Exam Neurovascular status intact with thick yellow brittle nailbeds 1-5 both feet    Assessment:     Mycotic nail infection is with pain 1-5 both feet    Plan:     Debride painful nailbeds 1-5 both feet with no iatrogenic bleeding noted

## 2015-03-03 ENCOUNTER — Ambulatory Visit
Admission: RE | Admit: 2015-03-03 | Discharge: 2015-03-03 | Disposition: A | Discharge status: Home / Self Care | Payer: Medicare Other | Source: Ambulatory Visit | Attending: Internal Medicine | Admitting: Internal Medicine

## 2015-03-03 ENCOUNTER — Other Ambulatory Visit: Payer: Self-pay | Admitting: Internal Medicine

## 2015-03-03 DIAGNOSIS — M25562 Pain in left knee: Secondary | ICD-10-CM

## 2015-05-05 ENCOUNTER — Ambulatory Visit (INDEPENDENT_AMBULATORY_CARE_PROVIDER_SITE_OTHER): Payer: Medicare Other | Admitting: Podiatry

## 2015-05-05 ENCOUNTER — Encounter: Payer: Self-pay | Admitting: Podiatry

## 2015-05-05 DIAGNOSIS — M79673 Pain in unspecified foot: Secondary | ICD-10-CM | POA: Diagnosis not present

## 2015-05-05 DIAGNOSIS — B351 Tinea unguium: Secondary | ICD-10-CM | POA: Diagnosis not present

## 2015-05-05 NOTE — Progress Notes (Signed)
Subjective:     Patient ID: Zachary Obrien, male   DOB: May 18, 1921, 81 y.o.   MRN: [id number]  HPI patient presents with nail disease thickness and pain 1-5 both feet that he cannot cut   Review of Systems     Objective:   Physical Exam Neurovascular status intact with thick yellow brittle nailbeds 1-5 both feet    Assessment:     Mycotic nail infection is with pain 1-5 both feet    Plan:     Debride painful nailbeds 1-5 both feet with no iatrogenic bleeding noted

## 2015-08-14 ENCOUNTER — Ambulatory Visit (INDEPENDENT_AMBULATORY_CARE_PROVIDER_SITE_OTHER): Payer: Medicare Other | Admitting: Podiatry

## 2015-08-14 DIAGNOSIS — B351 Tinea unguium: Secondary | ICD-10-CM

## 2015-08-14 DIAGNOSIS — M79673 Pain in unspecified foot: Secondary | ICD-10-CM | POA: Diagnosis not present

## 2015-08-14 NOTE — Progress Notes (Signed)
Patient ID: STEWARD SAMES, male   DOB: 11-15-1920, 81 y.o.   MRN: [id number] Complaint:  Visit Type: Patient returns to my office for continued preventative foot care services. Complaint: Patient states" my nails have grown long and thick and become painful to walk and wear shoes" . The patient presents for preventative foot care services. No changes to ROS  Podiatric Exam: Vascular: dorsalis pedis and posterior tibial pulses are palpable bilateral. Capillary return is immediate. Temperature gradient is WNL. Skin turgor WNL  Sensorium: Normal Semmes Weinstein monofilament test. Normal tactile sensation bilaterally. Nail Exam: Pt has thick disfigured discolored nails with subungual debris noted bilateral entire nail hallux through fifth toenails Ulcer Exam: There is no evidence of ulcer or pre-ulcerative changes or infection. Orthopedic Exam: Muscle tone and strength are WNL. No limitations in general ROM. No crepitus or effusions noted. Foot type and digits show no abnormalities. Bony prominences are unremarkable. Skin: No Porokeratosis. No infection or ulcers  Diagnosis:  Onychomycosis, , Pain in right toe, pain in left toes  Treatment & Plan Procedures and Treatment: Consent by patient was obtained for treatment procedures. The patient understood the discussion of treatment and procedures well. All questions were answered thoroughly reviewed. Debridement of mycotic and hypertrophic toenails, 1 through 5 bilateral and clearing of subungual debris. No ulceration, no infection noted.  Return Visit-Office Procedure: Patient instructed to return to the office for a follow up visit 3 months for continued evaluation and treatment.

## 2015-09-03 ENCOUNTER — Other Ambulatory Visit: Payer: Self-pay | Admitting: Dermatology

## 2015-11-06 ENCOUNTER — Encounter (HOSPITAL_COMMUNITY): Payer: Self-pay | Admitting: Emergency Medicine

## 2015-11-06 ENCOUNTER — Emergency Department (HOSPITAL_COMMUNITY)
Admission: EM | Admit: 2015-11-06 | Discharge: 2015-11-06 | Disposition: A | Discharge status: Home / Self Care | Payer: Medicare Other | Attending: Emergency Medicine | Admitting: Emergency Medicine

## 2015-11-06 ENCOUNTER — Emergency Department (HOSPITAL_COMMUNITY): Payer: Medicare Other

## 2015-11-06 DIAGNOSIS — Z79899 Other long term (current) drug therapy: Secondary | ICD-10-CM | POA: Insufficient documentation

## 2015-11-06 DIAGNOSIS — Z8619 Personal history of other infectious and parasitic diseases: Secondary | ICD-10-CM | POA: Insufficient documentation

## 2015-11-06 DIAGNOSIS — B9789 Other viral agents as the cause of diseases classified elsewhere: Secondary | ICD-10-CM

## 2015-11-06 DIAGNOSIS — J069 Acute upper respiratory infection, unspecified: Secondary | ICD-10-CM | POA: Diagnosis not present

## 2015-11-06 DIAGNOSIS — R05 Cough: Secondary | ICD-10-CM | POA: Diagnosis present

## 2015-11-06 DIAGNOSIS — Z87891 Personal history of nicotine dependence: Secondary | ICD-10-CM | POA: Insufficient documentation

## 2015-11-06 DIAGNOSIS — J988 Other specified respiratory disorders: Secondary | ICD-10-CM

## 2015-11-06 DIAGNOSIS — R059 Cough, unspecified: Secondary | ICD-10-CM

## 2015-11-06 DIAGNOSIS — Z8551 Personal history of malignant neoplasm of bladder: Secondary | ICD-10-CM | POA: Diagnosis not present

## 2015-11-06 LAB — RAPID STREP SCREEN (MED CTR MEBANE ONLY): STREPTOCOCCUS, GROUP A SCREEN (DIRECT): NEGATIVE

## 2015-11-06 NOTE — Discharge Instructions (Signed)
Return without fail for worsening symptoms, including fever, difficulty breathing, chest pain, confusion, or any other symptoms concerning to you.  Cough, Adult A cough helps to clear your throat and lungs. A cough may last only 2-3 weeks (acute), or it may last longer than 8 weeks (chronic). Many different things can cause a cough. A cough may be a sign of an illness or another medical condition. HOME CARE  Pay attention to any changes in your cough.  Take medicines only as told by your doctor.  If you were prescribed an antibiotic medicine, take it as told by your doctor. Do not stop taking it even if you start to feel better.  Talk with your doctor before you try using a cough medicine.  Drink enough fluid to keep your pee (urine) clear or pale yellow.  If the air is dry, use a cold steam vaporizer or humidifier in your home.  Stay away from things that make you cough at work or at home.  If your cough is worse at night, try using extra pillows to raise your head up higher while you sleep.  Do not smoke, and try not to be around smoke. If you need help quitting, ask your doctor.  Do not have caffeine.  Do not drink alcohol.  Rest as needed. GET HELP IF:  You have new problems (symptoms).  You cough up yellow fluid (pus).  Your cough does not get better after 2-3 weeks, or your cough gets worse.  Medicine does not help your cough and you are not sleeping well.  You have pain that gets worse or pain that is not helped with medicine.  You have a fever.  You are losing weight and you do not know why.  You have night sweats. GET HELP RIGHT AWAY IF:  You cough up blood.  You have trouble breathing.  Your heartbeat is very fast.   This information is not intended to replace advice given to you by your health care provider. Make sure you discuss any questions you have with your health care provider.   Document Released: 07/08/2011 Document Revised: 07/16/2015  Document Reviewed: 01/01/2015 Elsevier Interactive Patient Education [employer].

## 2015-11-06 NOTE — ED Provider Notes (Signed)
CSN: [id number]     Arrival date & time 11/06/15  1400 History   First MD Initiated Contact with Patient 11/06/15 1700     Chief Complaint  Patient presents with  . Cough  . Sore Throat     (Consider location/radiation/quality/duration/timing/severity/associated sxs/prior Treatment) HPI 81 year old male who presents with 2-3 days of cough, congestion, and sore throat. States that his sons were recently ill with similar symptoms. Has had low-grade fever of 99 Fahrenheit. No difficulty breathing or chest pain. Eating and drinking normally. No confusion, nausea, vomiting, diarrhea, abdominal pain. Painful swallowing, but no swelling, difficulty speaking, or difficulty handling secretions. He was seen in UC today and noted to have pulse ox 93%, thus sent to ED for CXR.  Past Medical History  Diagnosis Date  . No pertinent past medical history   . Bladder cancer ([place])   . Dermatophytosis of nail    Past Surgical History  Procedure Laterality Date  . Tonsillectomy  age 25  . Hydrocele surgery  age 66  . Eye surgery  age 68    partial cornea tranplants  . Bilatera lens replacement sfor cataracts  age 53  . Detached retina surgery left eye  age 24  . Joint replacement  2010    right knee replacement  . Cystoscopy  09/13/2012    Procedure: CYSTOSCOPY;  Surgeon: Molli Hazard, MD;  Location: [facility];  Service: Urology;  Laterality: N/A;  . Transurethral resection of bladder tumor  09/13/2012    Procedure: TRANSURETHRAL RESECTION OF BLADDER TUMOR (TURBT);  Surgeon: Molli Hazard, MD;  Location: [facility];  Service: Urology;  Laterality: N/A;  fulguration of bladder tumor  . Cystoscopy with biopsy  10/25/2012    Procedure: CYSTOSCOPY WITH BIOPSY;  Surgeon: Molli Hazard, MD;  Location: [facility];  Service: Urology;  Laterality: N/A;   History reviewed. No pertinent family history. Social History  Substance Use Topics  . Smoking status: Former Smoker -- 2.00 packs/day for  20 years    Types: Cigarettes    Quit date: 11/08/1965  . Smokeless tobacco: Never Used  . Alcohol Use: 1.2 oz/week    2 Glasses of wine per week    Review of Systems 10/14 systems reviewed and are negative other than those stated in the HPI    Allergies  Review of patient's allergies indicates no known allergies.  Home Medications   Prior to Admission medications   Medication Sig Start Date End Date Taking? Authorizing Provider  cholecalciferol (VITAMIN D) 1000 UNITS tablet Take 1,000 Units by mouth daily.    Historical Provider, MD  ferrous gluconate (FERGON) 325 MG tablet Take 325 mg by mouth 2 (two) times daily.     Historical Provider, MD  finasteride (PROSCAR) 5 MG tablet Take 5 mg by mouth every morning.     Historical Provider, MD  Multiple Vitamins-Minerals (PRESERVISION AREDS 2 PO) Take by mouth.    Historical Provider, MD  vitamin B-12 (CYANOCOBALAMIN) 1000 MCG tablet Take 1,000 mcg by mouth daily.    Historical Provider, MD   BP 110/74 mmHg  Pulse 74  Temp(Src) 98.6 F (37 C) (Oral)  Resp 16  SpO2 95% Physical Exam Physical Exam  Nursing note and vitals reviewed. Constitutional: Well developed, well nourished, non-toxic, and in no acute distress Head: Normocephalic and atraumatic.  Mouth/Throat: Oropharynx is clear and moist.  Neck: Normal range of motion. Neck supple.  Cardiovascular: Normal rate and regular rhythm.   Pulmonary/Chest: Effort normal and breath  sounds normal.  Abdominal: Soft. There is no tenderness. There is no rebound and no guarding.  Musculoskeletal: Normal range of motion.  Neurological: Alert, no facial droop, fluent speech, moves all extremities symmetrically Skin: Skin is warm and dry.  Psychiatric: Cooperative  ED Course  Procedures (including critical care time) Labs Review Labs Reviewed  RAPID STREP SCREEN (NOT AT [facility])  CULTURE, GROUP A STREP    Imaging Review Dg Chest 2 View  11/06/2015  CLINICAL DATA:  Sore throat dry  cough 3 days. EXAM: CHEST  2 VIEW COMPARISON:  06/05/2014 and 01/10/2009 FINDINGS: Patient slightly rotated to the left. Lungs are adequately inflated with minimal linear scarring over the right base. There is no focal consolidation or effusion. Cardiomediastinal silhouette is within normal. There is minimal calcified plaque over the aortic arch. There are degenerative changes of the spine. IMPRESSION: No active cardiopulmonary disease. Electronically Signed   By: Marin Olp M.D.   On: 11/06/2015 14:53   I have personally reviewed and evaluated these images and lab results as part of my medical decision-making.   EKG Interpretation None      MDM   Final diagnoses:  Cough  Viral respiratory illness    81 year old male, otherwise healthy, who presents with cough, congestion, and sore throat for 3 days. Well appearing and in no acute distress. Breathing comfortably on room air with normal oxygenation, no conversational dyspnea, and normal breath sounds. CXR with infiltrate or other acute processes. Negative strep. Overall presentation consistent with viral respiratory illness. Discussed supportive care. He is ambulated in the ED with no increased work of breathing, subjective dyspnea, or desaturations. Appropriate for outpatient management. Strict return and follow-up instructions reviewed. He expressed understanding of all discharge instructions and felt comfortable with the plan of care.    Forde Dandy, MD 11/07/15 [phone]

## 2015-11-06 NOTE — ED Notes (Signed)
Pt ambulated at [facility] without drop in saturations.

## 2015-11-06 NOTE — ED Notes (Signed)
Pt c/o sore throat, cough, congestion, hoarse voice x 2 days. Pt states he has had low fevers but has been taking ibuprofen. Hx of bladder cancer, denies recent chemo/radiation.

## 2015-11-08 ENCOUNTER — Observation Stay (HOSPITAL_BASED_OUTPATIENT_CLINIC_OR_DEPARTMENT_OTHER)
Admission: EM | Admit: 2015-11-08 | Discharge: 2015-11-10 | Disposition: A | Discharge status: Home / Self Care | Payer: Medicare Other | Attending: Family Medicine | Admitting: Family Medicine

## 2015-11-08 ENCOUNTER — Emergency Department (HOSPITAL_BASED_OUTPATIENT_CLINIC_OR_DEPARTMENT_OTHER): Payer: Medicare Other

## 2015-11-08 ENCOUNTER — Encounter (HOSPITAL_BASED_OUTPATIENT_CLINIC_OR_DEPARTMENT_OTHER): Payer: Self-pay | Admitting: *Deleted

## 2015-11-08 DIAGNOSIS — Z66 Do not resuscitate: Secondary | ICD-10-CM | POA: Insufficient documentation

## 2015-11-08 DIAGNOSIS — N400 Enlarged prostate without lower urinary tract symptoms: Secondary | ICD-10-CM | POA: Diagnosis not present

## 2015-11-08 DIAGNOSIS — J9811 Atelectasis: Secondary | ICD-10-CM | POA: Insufficient documentation

## 2015-11-08 DIAGNOSIS — J189 Pneumonia, unspecified organism: Principal | ICD-10-CM | POA: Insufficient documentation

## 2015-11-08 DIAGNOSIS — Z8551 Personal history of malignant neoplasm of bladder: Secondary | ICD-10-CM | POA: Diagnosis not present

## 2015-11-08 DIAGNOSIS — Z87891 Personal history of nicotine dependence: Secondary | ICD-10-CM | POA: Insufficient documentation

## 2015-11-08 DIAGNOSIS — E876 Hypokalemia: Secondary | ICD-10-CM | POA: Diagnosis not present

## 2015-11-08 LAB — CBC WITH DIFFERENTIAL/PLATELET
BASOS ABS: 0 10*3/uL (ref 0.0–0.1)
Basophils Relative: 0 %
EOS PCT: 0 %
Eosinophils Absolute: 0 10*3/uL (ref 0.0–0.7)
HEMATOCRIT: 36.4 % — AB (ref 39.0–52.0)
Hemoglobin: 11.9 g/dL — ABNORMAL LOW (ref 13.0–17.0)
LYMPHS ABS: 0.6 10*3/uL — AB (ref 0.7–4.0)
LYMPHS PCT: 8 %
MCH: 31.5 pg (ref 26.0–34.0)
MCHC: 32.7 g/dL (ref 30.0–36.0)
MCV: 96.3 fL (ref 78.0–100.0)
MONO ABS: 0.8 10*3/uL (ref 0.1–1.0)
Monocytes Relative: 11 %
NEUTROS ABS: 6.3 10*3/uL (ref 1.7–7.7)
Neutrophils Relative %: 81 %
PLATELETS: 131 10*3/uL — AB (ref 150–400)
RBC: 3.78 MIL/uL — AB (ref 4.22–5.81)
RDW: 12.2 % (ref 11.5–15.5)
WBC: 7.7 10*3/uL (ref 4.0–10.5)

## 2015-11-08 LAB — URINALYSIS, ROUTINE W REFLEX MICROSCOPIC
Bilirubin Urine: NEGATIVE
GLUCOSE, UA: NEGATIVE mg/dL
Ketones, ur: NEGATIVE mg/dL
LEUKOCYTES UA: NEGATIVE
Nitrite: POSITIVE — AB
PH: 7.5 (ref 5.0–8.0)
Protein, ur: 30 mg/dL — AB
SPECIFIC GRAVITY, URINE: 1.013 (ref 1.005–1.030)

## 2015-11-08 LAB — BASIC METABOLIC PANEL
ANION GAP: 8 (ref 5–15)
BUN: 23 mg/dL — AB (ref 6–20)
CO2: 23 mmol/L (ref 22–32)
Calcium: 8.2 mg/dL — ABNORMAL LOW (ref 8.9–10.3)
Chloride: 97 mmol/L — ABNORMAL LOW (ref 101–111)
Creatinine, Ser: 1.16 mg/dL (ref 0.61–1.24)
GFR calc Af Amer: >60 mL/min (ref >60)
GFR calc non Af Amer: 53 mL/min — ABNORMAL LOW (ref >60)
GLUCOSE: 120 mg/dL — AB (ref 65–99)
POTASSIUM: 3.5 mmol/L (ref 3.5–5.1)
Sodium: 128 mmol/L — ABNORMAL LOW (ref 135–145)

## 2015-11-08 LAB — URINE MICROSCOPIC-ADD ON

## 2015-11-08 LAB — I-STAT CG4 LACTIC ACID, ED: Lactic Acid, Venous: 1.55 mmol/L (ref 0.5–2.0)

## 2015-11-08 LAB — CULTURE, GROUP A STREP: STREP A CULTURE: NEGATIVE

## 2015-11-08 MED ORDER — DEXTROSE 5 % IV SOLN
1.0000 g | INTRAVENOUS | Status: DC
  Administered 2015-11-09: 1 g via INTRAVENOUS
  Filled 2015-11-08 (×2): qty 10

## 2015-11-08 MED ORDER — ENOXAPARIN SODIUM 40 MG/0.4ML ~~LOC~~ SOLN
40.0000 mg | SUBCUTANEOUS | Status: DC
  Administered 2015-11-08 – 2015-11-09 (×2): 40 mg via SUBCUTANEOUS
  Filled 2015-11-08 (×2): qty 0.4

## 2015-11-08 MED ORDER — SODIUM CHLORIDE 0.9 % IV SOLN
INTRAVENOUS | Status: AC
  Administered 2015-11-08: 18:00:00 via INTRAVENOUS

## 2015-11-08 MED ORDER — DEXTROSE 5 % IV SOLN
1.0000 g | Freq: Once | INTRAVENOUS | Status: AC
  Administered 2015-11-08: 1 g via INTRAVENOUS

## 2015-11-08 MED ORDER — SODIUM CHLORIDE 0.9 % IV SOLN
INTRAVENOUS | Status: DC
  Administered 2015-11-09 – 2015-11-10 (×2): via INTRAVENOUS

## 2015-11-08 MED ORDER — FINASTERIDE 5 MG PO TABS
5.0000 mg | ORAL_TABLET | Freq: Every day | ORAL | Status: DC
  Administered 2015-11-09 – 2015-11-10 (×2): 5 mg via ORAL
  Filled 2015-11-08 (×3): qty 1

## 2015-11-08 MED ORDER — AZITHROMYCIN 500 MG IV SOLR
INTRAVENOUS | Status: AC
Start: 2015-11-08 — End: 2015-11-09
  Filled 2015-11-08: qty 500

## 2015-11-08 MED ORDER — SODIUM CHLORIDE 0.9 % IV BOLUS (SEPSIS)
1000.0000 mL | Freq: Once | INTRAVENOUS | Status: AC
  Administered 2015-11-08: 1000 mL via INTRAVENOUS

## 2015-11-08 MED ORDER — ALBUTEROL SULFATE (2.5 MG/3ML) 0.083% IN NEBU: 2.5000 mg | INHALATION_SOLUTION | RESPIRATORY_TRACT | Status: AC | PRN

## 2015-11-08 MED ORDER — AZITHROMYCIN 250 MG PO TABS
500.0000 mg | ORAL_TABLET | ORAL | Status: DC
  Administered 2015-11-09: 500 mg via ORAL
  Filled 2015-11-08: qty 2

## 2015-11-08 MED ORDER — DEXTROSE 5 % IV SOLN
500.0000 mg | Freq: Once | INTRAVENOUS | Status: AC
  Administered 2015-11-08: 500 mg via INTRAVENOUS

## 2015-11-08 MED ORDER — CEFTRIAXONE SODIUM 1 G IJ SOLR
INTRAMUSCULAR | Status: AC
  Filled 2015-11-08: qty 10

## 2015-11-08 NOTE — ED Notes (Signed)
Pt at [facility] 2 days ago and dx with cough-denies PNA.  States that the cough continues and pt is unable to move around as he usually would due to being tired and weak. Pt noted to have rhonchi bilaterally.

## 2015-11-08 NOTE — Plan of Care (Signed)
[facility]  81 y/o male with cough, congestion, weakness. Presented to the ER 2 days ago and was discharged home. Feels much worse and is quite weak. CXR suspicious for possible RLL infiltrate. Also hyponatremic and dehydrated. Will be admitted for CAP. Receiving a 1 L NS bolus in ER.    Debbe Odea, MD

## 2015-11-08 NOTE — H&P (Signed)
History and Physical  Zachary Obrien [id number] DOB: 16-Jun-1921 DOA: 11/08/2015  PCP: Henrine Screws, MD   Chief Complaint: Cough  History of Present Illness:  Patient is a 81 yo male with history of BPH who was transferred here due to pneumonia. He has had cough for a few days that is productive of sputum with no chest pain, fever or chills. He also has been feeling fatigue and tired. He denied N/V/D/C/abd pain, dysuria. He has no other complaints. He was mildly short of breath on exertion.   Review of Systems:  CONSTITUTIONAL:  No night sweats.  +fatigue,++.  No fever or chills. Eyes:  No visual changes.  No eye pain.  No eye discharge.   ENT:    No epistaxis.  No sinus pain.  No sore throat.  No ear pain.  No congestion. RESPIRATORY:  +cough.  No wheeze.  No hemoptysis.  +shortness of breath. CARDIOVASCULAR:  No chest pains.  No palpitations. GASTROINTESTINAL:  No abdominal pain.  No nausea or vomiting.  No diarrhea or constipation.  No hematemesis.  No hematochezia.  No melena. GENITOURINARY:  No urgency.  No frequency.  No dysuria.  No hematuria.  +obstructive symptoms.  No discharge.  No pain.  No significant abnormal bleeding. MUSCULOSKELETAL:  No musculoskeletal pain.  No joint swelling.  No arthritis. NEUROLOGICAL:  No confusion.  No weakness. No headache. No seizure. PSYCHIATRIC:  No depression. No anxiety. No suicidal ideation. SKIN:  No rashes.  No lesions.  No wounds. ENDOCRINE:  No unexplained weight loss.  No polydipsia.  No polyuria.  No polyphagia. HEMATOLOGIC:  No anemia.  No purpura.  No petechiae.  No bleeding.  ALLERGIC AND IMMUNOLOGIC:  No pruritus.  No swelling Other:  Past Medical and Surgical History:   Past Medical History  Diagnosis Date  . No pertinent past medical history   . Bladder cancer ([place])   . Dermatophytosis of nail    Past Surgical History  Procedure Laterality Date  . Tonsillectomy  age 60  . Hydrocele surgery  age 85   . Eye surgery  age 26    partial cornea tranplants  . Bilatera lens replacement sfor cataracts  age 55  . Detached retina surgery left eye  age 29  . Joint replacement  2010    right knee replacement  . Cystoscopy  09/13/2012    Procedure: CYSTOSCOPY;  Surgeon: Molli Hazard, MD;  Location: [facility];  Service: Urology;  Laterality: N/A;  . Transurethral resection of bladder tumor  09/13/2012    Procedure: TRANSURETHRAL RESECTION OF BLADDER TUMOR (TURBT);  Surgeon: Molli Hazard, MD;  Location: [facility];  Service: Urology;  Laterality: N/A;  fulguration of bladder tumor  . Cystoscopy with biopsy  10/25/2012    Procedure: CYSTOSCOPY WITH BIOPSY;  Surgeon: Molli Hazard, MD;  Location: [facility];  Service: Urology;  Laterality: N/A;    Social History:   reports that he quit smoking about 50 years ago. His smoking use included Cigarettes. He has a 40 pack-year smoking history. He has never used smokeless tobacco. He reports that he drinks about 1.2 oz of alcohol per week. He reports that he does not use illicit drugs.   No Known Allergies  History reviewed. No pertinent family history.    Prior to Admission medications   Medication Sig Start Date End Date Taking? Authorizing Provider  Cholecalciferol (VITAMIN D PO) Take 1 tablet by mouth daily.   Yes Historical Provider, MD  ferrous  sulfate 325 (65 FE) MG tablet Take 325 mg by mouth 2 (two) times daily with a meal.   Yes Historical Provider, MD  finasteride (PROSCAR) 5 MG tablet Take 5 mg by mouth daily.    Yes Historical Provider, MD  ibuprofen (ADVIL,MOTRIN) 200 MG tablet Take 400 mg by mouth every 6 (six) hours as needed (pain).   Yes Historical Provider, MD  PRESCRIPTION MEDICATION Apply 1 application topically See admin instructions. Medication for fingernail fungus - apply to fingernail daily for 6 days (Monday thru Saturday), wipe off with alcohol swab on Sunday, repeat until fungus is gone   Yes Historical Provider,  MD  vitamin B-12 (CYANOCOBALAMIN) 100 MCG tablet Take 100 mcg by mouth daily.   Yes Historical Provider, MD    Physical Exam: BP 140/80 mmHg  Pulse 102  Temp(Src) 99.8 F (37.7 C) (Oral)  Resp 18  Ht 5\' 8"  (1.727 m)  Wt 74.844 kg (165 lb)  BMI 25.09 kg/m2  SpO2 95%  GENERAL :appears to be in no acute distress. HEAD: normocephalic. EYES: PERRL, EOMI.vision is grossly intact. EARS:  hearing grossly intact. NOSE: No nasal discharge. THROAT: Oral cavity and pharynx normal.  NECK: Neck supple CARDIAC: Normal S1 and S2. No S3, S4 or murmurs. Rhythm is regular. There is no peripheral edema. LUNGS: mild wheezing with coarse breathing sounds in RML/RLL ABDOMEN: Positive bowel sounds. Soft, nondistended, nontender. No guarding or rebound. No masses. NEUROLOGICAL: The mental examination revealed the patient was oriented to person, place, and time.CN II-XII intact.  SKIN: Skin normal color, texture and turgor with no lesions or eruptions. PSYCHIATRIC:  The patient was able to demonstrate good judgement and reason, without hallucinations, abnormal affect or abnormal behaviors during the examination. Patient is not suicidal.          Labs on Admission:  Reviewed.   Radiological Exams on Admission: Dg Chest 2 View  11/08/2015  CLINICAL DATA:  81 year old male with cough and fever for 5 days. EXAM: CHEST  2 VIEW COMPARISON:  11/06/2015 and prior exams FINDINGS: Increased right lower lobe opacity is noted suspicious for pneumonia. Mild bibasilar atelectasis/ scarring again noted. Cardiomediastinal silhouette is unchanged. There is no evidence of pulmonary edema, suspicious pulmonary nodule/mass, pleural effusion, or pneumothorax. No acute bony abnormalities are identified. IMPRESSION: Right lower lobe opacity suspicious for pneumonia. Mild bibasilar atelectasis/scarring again noted. Electronically Signed   By: Margarette Canada M.D.   On: 11/08/2015 14:11     Assessment/Plan  CAP: Started on  Cef/azithromycin IVF Monitor for clinical improvement  BPH; cont Finasteride     DVT prophylaxis: Bath enoxaparin.  Code Status: DNR/DNI per patient wishes.     Gennaro Africa M.D [facility]

## 2015-11-08 NOTE — Progress Notes (Signed)
ANTIBIOTIC CONSULT NOTE - INITIAL  Pharmacy Consult for ceftriaxone Indication: pneumonia  No Known Allergies  Patient Measurements: Height: 5\' 8"  (172.7 cm) Weight: 165 lb (74.844 kg) IBW/kg (Calculated) : 68.4  Vital Signs: Temp: 99.8 F (37.7 C) (12/31 1830) Temp Source: Oral (12/31 1830) BP: 140/80 mmHg (12/31 1830) Pulse Rate: 102 (12/31 1830) Intake/Output from previous day:   Intake/Output from this shift:    Labs:  Recent Labs  11/08/15 1430  WBC 7.7  HGB 11.9*  PLT 131*  CREATININE 1.16   Estimated Creatinine Clearance: 39.3 mL/min (by C-G formula based on Cr of 1.16). No results for input(s): VANCOTROUGH, VANCOPEAK, VANCORANDOM, GENTTROUGH, GENTPEAK, GENTRANDOM, TOBRATROUGH, TOBRAPEAK, TOBRARND, AMIKACINPEAK, AMIKACINTROU, AMIKACIN in the last 72 hours.   Microbiology: Recent Results (from the past 720 hour(s))  Rapid strep screen (not at [facility])     Status: None   Collection Time: 11/06/15  2:30 PM  Result Value Ref Range Status   Streptococcus, Group A Screen (Direct) NEGATIVE NEGATIVE Final    Comment: (NOTE) A Rapid Antigen test may result negative if the antigen level in the sample is below the detection level of this test. The FDA has not cleared this test as a stand-alone test therefore the rapid antigen negative result has reflexed to a Group A Strep culture.   Culture, Group A Strep     Status: None   Collection Time: 11/06/15  2:30 PM  Result Value Ref Range Status   Strep A Culture Negative  Final    Comment: (NOTE) Performed At: [facility] [address] [place], [place] [id number] Lindon Romp MD [id number]     Medical History: Past Medical History  Diagnosis Date  . No pertinent past medical history   . Bladder cancer ([place])   . Dermatophytosis of nail     Assessment: 81 yo M presents on 12/31 with cough, congestion and weakness. CXR suspicious for possible RLL infiltrate. Pharmacy consulted to dose ceftriaxone  for CAP. Already given ceftriaxone 1g IV x 1 in the ED today. MD also ordered azithromycin. Afebrile, WBC wnl.  Goal of Therapy:  Resolution of infection  Plan:  Start ceftriaxone 1g IV Q24 tomorrow Continue azithromycin 500mg  PO Q24 per MD Monitor clinical picture F/U C&S, abx deescalation / LOT

## 2015-11-08 NOTE — ED Provider Notes (Signed)
CSN: [id number]     Arrival date & time 11/08/15  1316 History   First MD Initiated Contact with Patient 11/08/15 1342     Chief Complaint  Patient presents with  . Weakness   HPI  Zachary Obrien is a 81 year old male with PMHx of bladder cancer presenting with weakness and cough. He reports dry cough for the past 5 days. He had a sore throat 2-3 days ago but this has resolved prior to arrival. He is also complaining of chest congestion and feels like he cannot cough up what it is in his chest. He also notes that he has felt weak and fatigued over the past 2 days. He states that he typically walks at home with the assistance of a cane but has been using a walker due to his weakness. He does admit to decreased fluid intake over the past few days. He was seen at [facility] 2 days ago for same complaints. He had a negative chest x-ray and negative strep at that time and was discharged home as a viral respiratory infection. He states the only new symptoms today is generalized weakness. Denies fevers, chills, headaches, syncope, congestion, rhinorrhea, chest pain, shortness of breath, abdominal pain, nausea, vomiting, diarrhea, dysuria or increased frequency. He took ibuprofen 2 days ago but has not taken any other over-the-counter medications.  Past Medical History  Diagnosis Date  . No pertinent past medical history   . Bladder cancer ([place])   . Dermatophytosis of nail    Past Surgical History  Procedure Laterality Date  . Tonsillectomy  age 73  . Hydrocele surgery  age 58  . Eye surgery  age 28    partial cornea tranplants  . Bilatera lens replacement sfor cataracts  age 68  . Detached retina surgery left eye  age 16  . Joint replacement  2010    right knee replacement  . Cystoscopy  09/13/2012    Procedure: CYSTOSCOPY;  Surgeon: Molli Hazard, MD;  Location: [facility];  Service: Urology;  Laterality: N/A;  . Transurethral resection of bladder tumor  09/13/2012    Procedure: TRANSURETHRAL  RESECTION OF BLADDER TUMOR (TURBT);  Surgeon: Molli Hazard, MD;  Location: [facility];  Service: Urology;  Laterality: N/A;  fulguration of bladder tumor  . Cystoscopy with biopsy  10/25/2012    Procedure: CYSTOSCOPY WITH BIOPSY;  Surgeon: Molli Hazard, MD;  Location: [facility];  Service: Urology;  Laterality: N/A;   History reviewed. No pertinent family history. Social History  Substance Use Topics  . Smoking status: Former Smoker -- 2.00 packs/day for 20 years    Types: Cigarettes    Quit date: 11/08/1965  . Smokeless tobacco: Never Used  . Alcohol Use: 1.2 oz/week    2 Glasses of wine per week    Review of Systems  Constitutional: Positive for fatigue.  HENT: Positive for sore throat.   Respiratory: Positive for cough.   Neurological: Positive for weakness.  All other systems reviewed and are negative.     Allergies  Review of patient's allergies indicates no known allergies.  Home Medications   Prior to Admission medications   Medication Sig Start Date End Date Taking? Authorizing Provider  Cholecalciferol (VITAMIN D PO) Take 1 tablet by mouth daily.   Yes Historical Provider, MD  ferrous sulfate 325 (65 FE) MG tablet Take 325 mg by mouth 2 (two) times daily with a meal.   Yes Historical Provider, MD  finasteride (PROSCAR) 5 MG  tablet Take 5 mg by mouth daily.    Yes Historical Provider, MD  ibuprofen (ADVIL,MOTRIN) 200 MG tablet Take 400 mg by mouth every 6 (six) hours as needed (pain).   Yes Historical Provider, MD  PRESCRIPTION MEDICATION Apply 1 application topically See admin instructions. Medication for fingernail fungus - apply to fingernail daily for 6 days (Monday thru Saturday), wipe off with alcohol swab on Sunday, repeat until fungus is gone   Yes Historical Provider, MD  vitamin B-12 (CYANOCOBALAMIN) 100 MCG tablet Take 100 mcg by mouth daily.   Yes Historical Provider, MD   BP 140/80 mmHg  Pulse 102  Temp(Src) 99.8 F (37.7 C) (Oral)  Resp 18   Ht 5\' 8"  (1.727 m)  Wt 74.844 kg  BMI 25.09 kg/m2  SpO2 95% Physical Exam  Constitutional: He is oriented to person, place, and time. He appears well-developed and well-nourished. No distress.  HENT:  Head: Normocephalic and atraumatic.  Mouth/Throat: Oropharynx is clear and moist.  Eyes: Conjunctivae are normal. Right eye exhibits no discharge. Left eye exhibits no discharge. No scleral icterus.  Neck: Normal range of motion.  Cardiovascular: Normal rate and regular rhythm.   Pulmonary/Chest: Effort normal. No respiratory distress.  Rhonchus breath sounds right > left.  Abdominal: Soft. There is no tenderness.  Musculoskeletal: Normal range of motion.  Neurological: He is alert and oriented to person, place, and time. Coordination normal.  Skin: Skin is warm and dry.  Psychiatric: He has a normal mood and affect. His behavior is normal.  Nursing note and vitals reviewed.   ED Course  Procedures (including critical care time) Labs Review Labs Reviewed  CBC WITH DIFFERENTIAL/PLATELET - Abnormal; Notable for the following:    RBC 3.78 (*)    Hemoglobin 11.9 (*)    HCT 36.4 (*)    Platelets 131 (*)    Lymphs Abs 0.6 (*)    All other components within normal limits  BASIC METABOLIC PANEL - Abnormal; Notable for the following:    Sodium 128 (*)    Chloride 97 (*)    Glucose, Bld 120 (*)    BUN 23 (*)    Calcium 8.2 (*)    GFR calc non Af Amer 53 (*)    All other components within normal limits  URINALYSIS, ROUTINE W REFLEX MICROSCOPIC (NOT AT [facility]) - Abnormal; Notable for the following:    APPearance CLOUDY (*)    Hgb urine dipstick MODERATE (*)    Protein, ur 30 (*)    Nitrite POSITIVE (*)    All other components within normal limits  URINE MICROSCOPIC-ADD ON - Abnormal; Notable for the following:    Squamous Epithelial / LPF 0-5 (*)    Bacteria, UA MANY (*)    All other components within normal limits  BASIC METABOLIC PANEL  I-STAT CG4 LACTIC ACID, ED  I-STAT CG4  LACTIC ACID, ED    Imaging Review Dg Chest 2 View  11/08/2015  CLINICAL DATA:  81 year old male with cough and fever for 5 days. EXAM: CHEST  2 VIEW COMPARISON:  11/06/2015 and prior exams FINDINGS: Increased right lower lobe opacity is noted suspicious for pneumonia. Mild bibasilar atelectasis/ scarring again noted. Cardiomediastinal silhouette is unchanged. There is no evidence of pulmonary edema, suspicious pulmonary nodule/mass, pleural effusion, or pneumothorax. No acute bony abnormalities are identified. IMPRESSION: Right lower lobe opacity suspicious for pneumonia. Mild bibasilar atelectasis/scarring again noted. Electronically Signed   By: Margarette Canada M.D.   On: 11/08/2015 14:11  I have personally reviewed and evaluated these images and lab results as part of my medical decision-making.   EKG Interpretation None      MDM   Final diagnoses:  CAP (community acquired pneumonia)   Pt presenting with worsening cough, weakness and fatigue. Pt was seen in ED recently and diagnosed with URI. Pt reports worsening symptoms since this time. Pt's O2 between 90-93% on RA. Pt is nontoxic appearing. Rhonchus breath sounds noted. Heart RRR. Abdomen is soft, non-tender. WBC 7.7. BMP suggestive of mild dehydration. Lactic acid 1.55. CXR positive for RLL PNA. Started on ceftriaxone and azithromycin in ED. Consulted hospitalist for admission for CAP due to clinical deterioration from baseline. Dr. Wynelle Cleveland accepting.     Lahoma Crocker Cherelle Midkiff, PA-C 11/09/15 [id number]  Sherwood Gambler, MD 11/09/15 [phone]

## 2015-11-09 DIAGNOSIS — J189 Pneumonia, unspecified organism: Secondary | ICD-10-CM | POA: Diagnosis not present

## 2015-11-09 LAB — BASIC METABOLIC PANEL
Anion gap: 9 (ref 5–15)
BUN: 16 mg/dL (ref 6–20)
CHLORIDE: 101 mmol/L (ref 101–111)
CO2: 22 mmol/L (ref 22–32)
CREATININE: 1.11 mg/dL (ref 0.61–1.24)
Calcium: 8 mg/dL — ABNORMAL LOW (ref 8.9–10.3)
GFR calc Af Amer: >60 mL/min (ref >60)
GFR, EST NON AFRICAN AMERICAN: 56 mL/min — AB (ref >60)
Glucose, Bld: 102 mg/dL — ABNORMAL HIGH (ref 65–99)
Potassium: 3.1 mmol/L — ABNORMAL LOW (ref 3.5–5.1)
SODIUM: 132 mmol/L — AB (ref 135–145)

## 2015-11-09 MED ORDER — POTASSIUM CHLORIDE CRYS ER 20 MEQ PO TBCR
40.0000 meq | EXTENDED_RELEASE_TABLET | ORAL | Status: AC
  Administered 2015-11-09 (×2): 40 meq via ORAL
  Filled 2015-11-09 (×2): qty 2

## 2015-11-09 NOTE — Progress Notes (Signed)
Utilization Review Completed.Levester Waldridge T1/11/2015  

## 2015-11-09 NOTE — Progress Notes (Signed)
[facility] PROGRESS NOTE  Zachary Obrien [id number] DOB: May 14, 1921 DOA: 11/08/2015 PCP: Henrine Screws, MD  Assessment/Plan: 1. Community-acquired pneumonia- improving, continue ceftriaxone, Zithromax. 2. Hypokalemia-replace potassium and check BMP in a.m. 3. BPH- continue Proscar. 4. DVT prophylaxis-Lovenox  Code Status: DNR Family Communication: No family at bedside Disposition Plan: Pending improvement in clinical condition, will get PT OT consultation   Consultants:  None   Procedures:  None   Antibiotics:  Ceftriaxone  HPI/Subjective: 82 yo male with history of BPH who was transferred here due to pneumonia. He has had cough for a few days that is productive of sputum with no chest pain, fever or chills. He also has been feeling fatigue and tired. He denied N/V/D/C/abd pain, dysuria. He has no other complaints. He was mildly short of breath on exertion.   Patient feels better this morning. Denies shortness of breath.   Objective: Filed Vitals:   11/09/15 0539 11/09/15 1348  BP: 125/63 132/68  Pulse: 100 98  Temp: 98.9 F (37.2 C) 98.7 F (37.1 C)  Resp: 19 18    Intake/Output Summary (Last 24 hours) at 11/09/15 1624 Last data filed at 11/09/15 1500  Gross per 24 hour  Intake 1894.17 ml  Output    350 ml  Net 1544.17 ml   Filed Weights   11/08/15 1321 11/08/15 1830  Weight: 74.844 kg (165 lb) 74.844 kg (165 lb)    Exam:   General:  Appears in no acute distress  Cardiovascular: S1-S2 is regular  Respiratory: Clear to auscultation bilaterally  Abdomen: Soft, nontender, no organomegaly  Musculoskeletal: No cyanosis/clubbing/edema of the lower extremities   Data Reviewed: Basic Metabolic Panel:  Recent Labs Lab 11/08/15 1430 11/09/15 0421  NA 128* 132*  K 3.5 3.1*  CL 97* 101  CO2 23 22  GLUCOSE 120* 102*  BUN 23* 16  CREATININE 1.16 1.11  CALCIUM 8.2* 8.0*   CBC:  Recent Labs Lab 11/08/15 1430  WBC 7.7   NEUTROABS 6.3  HGB 11.9*  HCT 36.4*  MCV 96.3  PLT 131*   Cardiac Enzymes: No results for input(s): CKTOTAL, CKMB, CKMBINDEX, TROPONINI in the last 168 hours. BNP (last 3 results) No results for input(s): BNP in the last 8760 hours.  ProBNP (last 3 results) No results for input(s): PROBNP in the last 8760 hours.  CBG: No results for input(s): GLUCAP in the last 168 hours.  Recent Results (from the past 240 hour(s))  Rapid strep screen (not at [facility])     Status: None   Collection Time: 11/06/15  2:30 PM  Result Value Ref Range Status   Streptococcus, Group A Screen (Direct) NEGATIVE NEGATIVE Final    Comment: (NOTE) A Rapid Antigen test may result negative if the antigen level in the sample is below the detection level of this test. The FDA has not cleared this test as a stand-alone test therefore the rapid antigen negative result has reflexed to a Group A Strep culture.   Culture, Group A Strep     Status: None   Collection Time: 11/06/15  2:30 PM  Result Value Ref Range Status   Strep A Culture Negative  Final    Comment: (NOTE) Performed At: [facility] [place], [place] [id number] Lindon Romp MD [id number]      Studies: Dg Chest 2 View  11/08/2015  CLINICAL DATA:  82 year old male with cough and fever for 5 days. EXAM: CHEST  2 VIEW COMPARISON:  11/06/2015 and prior exams  FINDINGS: Increased right lower lobe opacity is noted suspicious for pneumonia. Mild bibasilar atelectasis/ scarring again noted. Cardiomediastinal silhouette is unchanged. There is no evidence of pulmonary edema, suspicious pulmonary nodule/mass, pleural effusion, or pneumothorax. No acute bony abnormalities are identified. IMPRESSION: Right lower lobe opacity suspicious for pneumonia. Mild bibasilar atelectasis/scarring again noted. Electronically Signed   By: Margarette Canada M.D.   On: 11/08/2015 14:11    Scheduled Meds: . azithromycin  500 mg Oral Q24H  . cefTRIAXone  (ROCEPHIN)  IV  1 g Intravenous Q24H  . enoxaparin (LOVENOX) injection  40 mg Subcutaneous Q24H  . finasteride  5 mg Oral Daily  . potassium chloride  40 mEq Oral Q4H   Continuous Infusions: . sodium chloride 75 mL/hr at 11/08/15 1945    Active Problems:   CAP (community acquired pneumonia)   Pneumonia    Time spent: 25 min    [facility]  [facility] Pager [phone]*. If 7PM-7AM, please contact night-coverage at [email or web address], password [facility] 11/09/2015, 4:24 PM  LOS: 1 day

## 2015-11-10 DIAGNOSIS — J189 Pneumonia, unspecified organism: Secondary | ICD-10-CM | POA: Diagnosis not present

## 2015-11-10 LAB — BASIC METABOLIC PANEL
Anion gap: 5 (ref 5–15)
BUN: 16 mg/dL (ref 6–20)
CHLORIDE: 107 mmol/L (ref 101–111)
CO2: 24 mmol/L (ref 22–32)
Calcium: 8.1 mg/dL — ABNORMAL LOW (ref 8.9–10.3)
Creatinine, Ser: 1.06 mg/dL (ref 0.61–1.24)
GFR calc Af Amer: >60 mL/min (ref >60)
GFR calc non Af Amer: 59 mL/min — ABNORMAL LOW (ref >60)
Glucose, Bld: 96 mg/dL (ref 65–99)
POTASSIUM: 4.1 mmol/L (ref 3.5–5.1)
SODIUM: 136 mmol/L (ref 135–145)

## 2015-11-10 LAB — CBC
HEMATOCRIT: 31.7 % — AB (ref 39.0–52.0)
HEMOGLOBIN: 10.7 g/dL — AB (ref 13.0–17.0)
MCH: 32.1 pg (ref 26.0–34.0)
MCHC: 33.8 g/dL (ref 30.0–36.0)
MCV: 95.2 fL (ref 78.0–100.0)
Platelets: 118 10*3/uL — ABNORMAL LOW (ref 150–400)
RBC: 3.33 MIL/uL — AB (ref 4.22–5.81)
RDW: 12.7 % (ref 11.5–15.5)
WBC: 5.4 10*3/uL (ref 4.0–10.5)

## 2015-11-10 LAB — GLUCOSE, CAPILLARY: GLUCOSE-CAPILLARY: 81 mg/dL (ref 65–99)

## 2015-11-10 MED ORDER — LEVOFLOXACIN 750 MG PO TABS: 750.0000 mg | ORAL_TABLET | ORAL | Status: DC

## 2015-11-10 MED ORDER — LEVOFLOXACIN 750 MG PO TABS
750.0000 mg | ORAL_TABLET | ORAL | Status: DC
  Administered 2015-11-10: 750 mg via ORAL
  Filled 2015-11-10: qty 1

## 2015-11-10 NOTE — Care Management Note (Signed)
Case Management Note  Patient Details  Name: Zachary Obrien MRN: [id number] Date of Birth: 11/21/1920  Subjective/Objective:                    Action/Plan:  Confirmed face sheet information with patient and his son [name] [id number]  Expected Discharge Date:                  Expected Discharge Plan:  [place]  In-House Referral:     Discharge planning Services  CM Consult  Post Acute Care Choice:  Home Health Choice offered to:  Patient, Adult Children  DME Arranged:    DME Agency:     HH Arranged:  PT [place] Agency:  [facility]  Status of Service:  Completed, signed off  Medicare Important Message Given:    Date Medicare IM Given:    Medicare IM give by:    Date Additional Medicare IM Given:    Additional Medicare Important Message give by:     If discussed at [place] of Stay Meetings, dates discussed:    Additional Comments:  Marilu Favre, RN 11/10/2015, 12:07 PM

## 2015-11-10 NOTE — Evaluation (Signed)
Physical Therapy Evaluation Patient Details Name: Zachary Obrien MRN: [id number] DOB: [birth year] Today's Date: 11/10/2015   History of Present Illness  Pt admitted with CAP. PMHx: bladder CA, TURP, R TKA, BPH, macular degeneration  Clinical Impression  Mr.Satore is very pleasant and moving well. Cues needed to ease transitions with sit<>stand and recommend continued RW use at all times. Pt educated for incentive spirometer use and able to achieve 1750cc. Pt educated for safety in home and agreeable to HHPT to maximize independence and safety. Will follow acutely to maximize balance, dME use and gait.     Follow Up Recommendations Home health PT    Equipment Recommendations  None recommended by PT    Recommendations for Other Services       Precautions / Restrictions Precautions Precautions: Fall      Mobility  Bed Mobility Overal bed mobility: Modified Independent                Transfers Overall transfer level: Needs assistance   Transfers: Sit to/from Stand Sit to Stand: Supervision         General transfer comment: cues for hand placement, increased hip flexion and anterior translation with trials x 2  Ambulation/Gait Ambulation/Gait assistance: Supervision Ambulation Distance (Feet): 550 Feet Assistive device: Rolling walker (2 wheeled) Gait Pattern/deviations: Step-through pattern;Decreased stride length;Trunk flexed   Gait velocity interpretation: at or above normal speed for age/gender General Gait Details: cues for position in RW and safety to continue RW use at all times to prevent falls  Stairs Stairs: Yes Stairs assistance: Modified independent (Device/Increase time) Stair Management: One rail Right;Step to pattern;Forwards Number of Stairs: 4 General stair comments: pt grasps rail with both hands for ascending/descending, no LOB or difficulty with aid of railing  Wheelchair Mobility    Modified Rankin (Stroke Patients Only)        Balance                                             Pertinent Vitals/Pain Pain Assessment: No/denies pain    Home Living Family/patient expects to be discharged to:: Private residence Living Arrangements: Alone Available Help at Discharge: Family;Available PRN/intermittently Type of Home: House Home Access: Stairs to enter Entrance Stairs-Rails: Right Entrance Stairs-Number of Steps: 4 Home Layout: One level Home Equipment: Walker - 2 wheels;Walker - 4 wheels;Toilet riser Additional Comments: pt legally blind    Prior Function Level of Independence: Independent         Comments: pt is supposed to use cane but has not been doing so, 2 falls in the last year. Typically microwaves meals due to limited vision. Pt lives alone, cares for himself with assist for cleaning 2x/month, does not drive     Hand Dominance        Extremity/Trunk Assessment   Upper Extremity Assessment: Generalized weakness           Lower Extremity Assessment: Generalized weakness      Cervical / Trunk Assessment: Kyphotic;Other exceptions  Communication   Communication: HOH  Cognition Arousal/Alertness: Awake/alert Behavior During Therapy: WFL for tasks assessed/performed Overall Cognitive Status: Within Functional Limits for tasks assessed                      General Comments      Exercises        Assessment/Plan  PT Assessment Patient needs continued PT services  PT Diagnosis Generalized weakness   PT Problem List Decreased strength;Decreased balance;Decreased knowledge of use of DME  PT Treatment Interventions Gait training;DME instruction;Functional mobility training;Therapeutic exercise;Therapeutic activities;Patient/family education   PT Goals (Current goals can be found in the Care Plan section) Acute Rehab PT Goals Patient Stated Goal: return home PT Goal Formulation: With patient/family Time For Goal Achievement: 11/17/15 Potential to  Achieve Goals: Good    Frequency Min 3X/week   Barriers to discharge Decreased caregiver support      Co-evaluation               End of Session   Activity Tolerance: Patient tolerated treatment well Patient left: in chair;with call bell/phone within reach;with family/visitor present Nurse Communication: Mobility status    Functional Assessment Tool Used: clinical judgement Functional Limitation: Mobility: Walking and moving around Mobility: Walking and Moving Around Current Status [id number]): At least 1 percent but less than 20 percent impaired, limited or restricted Mobility: Walking and Moving Around Goal Status [phone]): At least 1 percent but less than 20 percent impaired, limited or restricted    Time: 1122-1146 PT Time Calculation (min) (ACUTE ONLY): 24 min   Charges:   PT Evaluation $Initial PT Evaluation Tier I: 1 Procedure PT Treatments $Gait Training: 8-22 mins   PT G Codes:   PT G-Codes **NOT FOR INPATIENT CLASS** Functional Assessment Tool Used: clinical judgement Functional Limitation: Mobility: Walking and moving around Mobility: Walking and Moving Around Current Status [id number]): At least 1 percent but less than 20 percent impaired, limited or restricted Mobility: Walking and Moving Around Goal Status [phone]): At least 1 percent but less than 20 percent impaired, limited or restricted    Melford Aase 11/10/2015, 11:55 AM Elwyn Reach, [place]

## 2015-11-10 NOTE — Discharge Summary (Signed)
Physician Discharge Summary  Zachary Obrien [id number] DOB: 1921/09/05 DOA: 11/08/2015  PCP: Henrine Screws, MD  Admit date: 11/08/2015 Discharge date: 11/10/2015  Time spent: 25* minutes  Recommendations for Outpatient Follow-up:  1. Follow-up PCP in 2 weeks   Discharge Diagnoses:  Active Problems:   CAP (community acquired pneumonia)   Pneumonia   Discharge Condition: Stable  Diet recommendation: Low-salt diet  Filed Weights   11/08/15 1321 11/08/15 1830  Weight: 74.844 kg (165 lb) 74.844 kg (165 lb)    History of present illness:  82 yo male with history of BPH who was transferred here due to pneumonia. He has had cough for a few days that is productive of sputum with no chest pain, fever or chills. He also has been feeling fatigue and tired. He denied N/V/D/C/abd pain, dysuria. He has no other complaints. He was mildly short of breath on exertion  Hospital Course:  1. Community-acquired pneumonia- improving, patient was started on Rocephin and Zithromax. He has significantly improved. Ambulating without getting short of breath. Will discharge on Levaquin 750 mg 1 tablet every 48 hours. Will discharge on 3 tablets. 2. Hypokalemia-potassium replaced, potassium of 4.1. 3. BPH- continue Proscar.  Procedures:  None  Consultations:  None  Discharge Exam: Filed Vitals:   11/09/15 2126 11/10/15 0447  BP: 121/65 127/77  Pulse: 91 82  Temp: 98.9 F (37.2 C) 98.3 F (36.8 C)  Resp: 19 18    General: Appears in no acute distress Cardiovascular: S1-S2 regular Respiratory: Clear to auscultation bilaterally  Discharge Instructions   Discharge Instructions    Diet - low sodium heart healthy    Complete by:  As directed      Increase activity slowly    Complete by:  As directed           Current Discharge Medication List    START taking these medications   Details  levofloxacin (LEVAQUIN) 750 MG tablet Take 1 tablet (750 mg total) by mouth every  other day. Qty: 3 tablet, Refills: 0      CONTINUE these medications which have NOT CHANGED   Details  Cholecalciferol (VITAMIN D PO) Take 1 tablet by mouth daily.    ferrous sulfate 325 (65 FE) MG tablet Take 325 mg by mouth 2 (two) times daily with a meal.    finasteride (PROSCAR) 5 MG tablet Take 5 mg by mouth daily.     ibuprofen (ADVIL,MOTRIN) 200 MG tablet Take 400 mg by mouth every 6 (six) hours as needed (pain).    PRESCRIPTION MEDICATION Apply 1 application topically See admin instructions. Medication for fingernail fungus - apply to fingernail daily for 6 days (Monday thru Saturday), wipe off with alcohol swab on Sunday, repeat until fungus is gone    vitamin B-12 (CYANOCOBALAMIN) 100 MCG tablet Take 100 mcg by mouth daily.       No Known Allergies    The results of significant diagnostics from this hospitalization (including imaging, microbiology, ancillary and laboratory) are listed below for reference.    Significant Diagnostic Studies: Dg Chest 2 View  11/08/2015  CLINICAL DATA:  82 year old male with cough and fever for 5 days. EXAM: CHEST  2 VIEW COMPARISON:  11/06/2015 and prior exams FINDINGS: Increased right lower lobe opacity is noted suspicious for pneumonia. Mild bibasilar atelectasis/ scarring again noted. Cardiomediastinal silhouette is unchanged. There is no evidence of pulmonary edema, suspicious pulmonary nodule/mass, pleural effusion, or pneumothorax. No acute bony abnormalities are identified. IMPRESSION: Right lower lobe opacity  suspicious for pneumonia. Mild bibasilar atelectasis/scarring again noted. Electronically Signed   By: Margarette Canada M.D.   On: 11/08/2015 14:11   Dg Chest 2 View  11/06/2015  CLINICAL DATA:  Sore throat dry cough 3 days. EXAM: CHEST  2 VIEW COMPARISON:  06/05/2014 and 01/10/2009 FINDINGS: Patient slightly rotated to the left. Lungs are adequately inflated with minimal linear scarring over the right base. There is no focal  consolidation or effusion. Cardiomediastinal silhouette is within normal. There is minimal calcified plaque over the aortic arch. There are degenerative changes of the spine. IMPRESSION: No active cardiopulmonary disease. Electronically Signed   By: Marin Olp M.D.   On: 11/06/2015 14:53    Microbiology: Recent Results (from the past 240 hour(s))  Rapid strep screen (not at [facility])     Status: None   Collection Time: 11/06/15  2:30 PM  Result Value Ref Range Status   Streptococcus, Group A Screen (Direct) NEGATIVE NEGATIVE Final    Comment: (NOTE) A Rapid Antigen test may result negative if the antigen level in the sample is below the detection level of this test. The FDA has not cleared this test as a stand-alone test therefore the rapid antigen negative result has reflexed to a Group A Strep culture.   Culture, Group A Strep     Status: None   Collection Time: 11/06/15  2:30 PM  Result Value Ref Range Status   Strep A Culture Negative  Final    Comment: (NOTE) Performed At: [facility] [place], [place] [id number] Lindon Romp MD [id number]      Labs: Basic Metabolic Panel:  Recent Labs Lab 11/08/15 1430 11/09/15 0421 11/10/15 0405  NA 128* 132* 136  K 3.5 3.1* 4.1  CL 97* 101 107  CO2 23 22 24   GLUCOSE 120* 102* 96  BUN 23* 16 16  CREATININE 1.16 1.11 1.06  CALCIUM 8.2* 8.0* 8.1*   Liver Function Tests: No results for input(s): AST, ALT, ALKPHOS, BILITOT, PROT, ALBUMIN in the last 168 hours. No results for input(s): LIPASE, AMYLASE in the last 168 hours. No results for input(s): AMMONIA in the last 168 hours. CBC:  Recent Labs Lab 11/08/15 1430 11/10/15 0405  WBC 7.7 5.4  NEUTROABS 6.3  --   HGB 11.9* 10.7*  HCT 36.4* 31.7*  MCV 96.3 95.2  PLT 131* 118*    CBG: No results for input(s): GLUCAP in the last 168 hours.     Signed:  Oswald Hillock MD  FACP  [facility] 11/10/2015, 12:04 PM

## 2015-11-10 NOTE — Progress Notes (Signed)
IV removed. Discharge paperwork given to patient. No questions verbalized. Patient is going to eat lunch then will be ready for discharge.

## 2015-11-13 ENCOUNTER — Ambulatory Visit (INDEPENDENT_AMBULATORY_CARE_PROVIDER_SITE_OTHER): Payer: Medicare Other | Admitting: Podiatry

## 2015-11-13 ENCOUNTER — Encounter: Payer: Self-pay | Admitting: Podiatry

## 2015-11-13 DIAGNOSIS — M79673 Pain in unspecified foot: Secondary | ICD-10-CM | POA: Diagnosis not present

## 2015-11-13 DIAGNOSIS — B351 Tinea unguium: Secondary | ICD-10-CM | POA: Diagnosis not present

## 2015-11-13 NOTE — Progress Notes (Signed)
Patient ID: SHIA FRICKEY, male   DOB: 1921-04-08, 82 y.o.   MRN: [id number] Complaint:  Visit Type: Patient returns to my office for continued preventative foot care services. Complaint: Patient states" my nails have grown long and thick and become painful to walk and wear shoes" . The patient presents for preventative foot care services. No changes to ROS  Podiatric Exam: Vascular: dorsalis pedis and posterior tibial pulses are palpable bilateral. Capillary return is immediate. Temperature gradient is WNL. Skin turgor WNL  Sensorium: Normal Semmes Weinstein monofilament test. Normal tactile sensation bilaterally. Nail Exam: Pt has thick disfigured discolored nails with subungual debris noted bilateral entire nail hallux through fifth toenails Ulcer Exam: There is no evidence of ulcer or pre-ulcerative changes or infection. Orthopedic Exam: Muscle tone and strength are WNL. No limitations in general ROM. No crepitus or effusions noted. Foot type and digits show no abnormalities. Bony prominences are unremarkable. Skin: No Porokeratosis. No infection or ulcers  Diagnosis:  Onychomycosis, , Pain in right toe, pain in left toes  Treatment & Plan Procedures and Treatment: Consent by patient was obtained for treatment procedures. The patient understood the discussion of treatment and procedures well. All questions were answered thoroughly reviewed. Debridement of mycotic and hypertrophic toenails, 1 through 5 bilateral and clearing of subungual debris. No ulceration, no infection noted.  Return Visit-Office Procedure: Patient instructed to return to the office for a follow up visit 3 months for continued evaluation and treatment.   Gardiner Barefoot DPM

## 2015-11-14 ENCOUNTER — Ambulatory Visit: Payer: Medicare Other | Admitting: Podiatry

## 2015-11-27 ENCOUNTER — Encounter: Payer: Self-pay | Admitting: Internal Medicine

## 2016-02-11 ENCOUNTER — Encounter: Payer: Self-pay | Admitting: Podiatry

## 2016-02-11 ENCOUNTER — Ambulatory Visit (INDEPENDENT_AMBULATORY_CARE_PROVIDER_SITE_OTHER): Payer: Medicare Other | Admitting: Podiatry

## 2016-02-11 DIAGNOSIS — B351 Tinea unguium: Secondary | ICD-10-CM | POA: Diagnosis not present

## 2016-02-11 DIAGNOSIS — M79673 Pain in unspecified foot: Secondary | ICD-10-CM | POA: Diagnosis not present

## 2016-02-11 NOTE — Progress Notes (Signed)
Patient ID: KINDRICK KRAAI, male   DOB: January 08, 1921, 82 y.o.   MRN: [id number] Complaint:  Visit Type: Patient returns to my office for continued preventative foot care services. Complaint: Patient states" my nails have grown long and thick and become painful to walk and wear shoes" . The patient presents for preventative foot care services. No changes to ROS.  Patient is legally blind.  Podiatric Exam: Vascular: dorsalis pedis and posterior tibial pulses are palpable bilateral. Capillary return is immediate. Temperature gradient is WNL. Skin turgor WNL  Sensorium: Normal Semmes Weinstein monofilament test. Normal tactile sensation bilaterally. Nail Exam: Pt has thick disfigured discolored nails with subungual debris noted bilateral entire nail hallux through fifth toenails Ulcer Exam: There is no evidence of ulcer or pre-ulcerative changes or infection. Orthopedic Exam: Muscle tone and strength are WNL. No limitations in general ROM. No crepitus or effusions noted. Foot type and digits show no abnormalities. Bony prominences are unremarkable. Skin: No Porokeratosis. No infection or ulcers  Diagnosis:  Onychomycosis, , Pain in right toe, pain in left toes  Treatment & Plan Procedures and Treatment: Consent by patient was obtained for treatment procedures. The patient understood the discussion of treatment and procedures well. All questions were answered thoroughly reviewed. Debridement of mycotic and hypertrophic toenails, 1 through 5 bilateral and clearing of subungual debris. No ulceration, no infection noted.  Return Visit-Office Procedure: Patient instructed to return to the office for a follow up visit 10 weeks  for continued evaluation and treatment.   Gardiner Barefoot DPM

## 2016-03-15 DIAGNOSIS — D72819 Decreased white blood cell count, unspecified: Secondary | ICD-10-CM | POA: Diagnosis not present

## 2016-03-15 DIAGNOSIS — Z0001 Encounter for general adult medical examination with abnormal findings: Secondary | ICD-10-CM | POA: Diagnosis not present

## 2016-03-15 DIAGNOSIS — Z1389 Encounter for screening for other disorder: Secondary | ICD-10-CM | POA: Diagnosis not present

## 2016-03-15 DIAGNOSIS — R269 Unspecified abnormalities of gait and mobility: Secondary | ICD-10-CM | POA: Diagnosis not present

## 2016-03-15 DIAGNOSIS — R351 Nocturia: Secondary | ICD-10-CM | POA: Diagnosis not present

## 2016-03-15 DIAGNOSIS — E559 Vitamin D deficiency, unspecified: Secondary | ICD-10-CM | POA: Diagnosis not present

## 2016-03-15 DIAGNOSIS — C679 Malignant neoplasm of bladder, unspecified: Secondary | ICD-10-CM | POA: Diagnosis not present

## 2016-03-15 DIAGNOSIS — K5730 Diverticulosis of large intestine without perforation or abscess without bleeding: Secondary | ICD-10-CM | POA: Diagnosis not present

## 2016-03-15 DIAGNOSIS — D81818 Other biotin-dependent carboxylase deficiency: Secondary | ICD-10-CM | POA: Diagnosis not present

## 2016-03-15 DIAGNOSIS — H3530 Unspecified macular degeneration: Secondary | ICD-10-CM | POA: Diagnosis not present

## 2016-04-22 ENCOUNTER — Ambulatory Visit (INDEPENDENT_AMBULATORY_CARE_PROVIDER_SITE_OTHER): Payer: Medicare Other | Admitting: Podiatry

## 2016-04-22 ENCOUNTER — Encounter: Payer: Self-pay | Admitting: Podiatry

## 2016-04-22 DIAGNOSIS — B351 Tinea unguium: Secondary | ICD-10-CM | POA: Diagnosis not present

## 2016-04-22 DIAGNOSIS — M79673 Pain in unspecified foot: Secondary | ICD-10-CM

## 2016-04-22 NOTE — Progress Notes (Signed)
Patient ID: Zachary Obrien, male   DOB: January 08, 1921, 82 y.o.   MRN: [id number] Complaint:  Visit Type: Patient returns to my office for continued preventative foot care services. Complaint: Patient states" my nails have grown long and thick and become painful to walk and wear shoes" . The patient presents for preventative foot care services. No changes to ROS.  Patient is legally blind.  Podiatric Exam: Vascular: dorsalis pedis and posterior tibial pulses are palpable bilateral. Capillary return is immediate. Temperature gradient is WNL. Skin turgor WNL  Sensorium: Normal Semmes Weinstein monofilament test. Normal tactile sensation bilaterally. Nail Exam: Pt has thick disfigured discolored nails with subungual debris noted bilateral entire nail hallux through fifth toenails Ulcer Exam: There is no evidence of ulcer or pre-ulcerative changes or infection. Orthopedic Exam: Muscle tone and strength are WNL. No limitations in general ROM. No crepitus or effusions noted. Foot type and digits show no abnormalities. Bony prominences are unremarkable. Skin: No Porokeratosis. No infection or ulcers  Diagnosis:  Onychomycosis, , Pain in right toe, pain in left toes  Treatment & Plan Procedures and Treatment: Consent by patient was obtained for treatment procedures. The patient understood the discussion of treatment and procedures well. All questions were answered thoroughly reviewed. Debridement of mycotic and hypertrophic toenails, 1 through 5 bilateral and clearing of subungual debris. No ulceration, no infection noted.  Return Visit-Office Procedure: Patient instructed to return to the office for a follow up visit 10 weeks  for continued evaluation and treatment.   Gardiner Barefoot DPM

## 2016-06-02 DIAGNOSIS — S161XXA Strain of muscle, fascia and tendon at neck level, initial encounter: Secondary | ICD-10-CM | POA: Diagnosis not present

## 2016-06-02 DIAGNOSIS — R351 Nocturia: Secondary | ICD-10-CM | POA: Diagnosis not present

## 2016-07-01 ENCOUNTER — Ambulatory Visit (INDEPENDENT_AMBULATORY_CARE_PROVIDER_SITE_OTHER): Payer: Medicare Other | Admitting: Podiatry

## 2016-07-01 ENCOUNTER — Encounter: Payer: Self-pay | Admitting: Podiatry

## 2016-07-01 DIAGNOSIS — B351 Tinea unguium: Secondary | ICD-10-CM

## 2016-07-01 DIAGNOSIS — M79673 Pain in unspecified foot: Secondary | ICD-10-CM | POA: Diagnosis not present

## 2016-07-01 NOTE — Progress Notes (Signed)
Patient ID: Zachary Obrien, male   DOB: 08/14/1921, 82 y.o.   MRN: [id number] Complaint:  Visit Type: Patient returns to my office for continued preventative foot care services. Complaint: Patient states" my nails have grown long and thick and become painful to walk and wear shoes" . The patient presents for preventative foot care services. No changes to ROS.  Patient is legally blind.  Podiatric Exam: Vascular: dorsalis pedis and posterior tibial pulses are palpable bilateral. Capillary return is immediate. Temperature gradient is WNL. Skin turgor WNL  Sensorium: Normal Semmes Weinstein monofilament test. Normal tactile sensation bilaterally. Nail Exam: Pt has thick disfigured discolored nails with subungual debris noted bilateral entire nail hallux through fifth toenails Ulcer Exam: There is no evidence of ulcer or pre-ulcerative changes or infection. Orthopedic Exam: Muscle tone and strength are WNL. No limitations in general ROM. No crepitus or effusions noted. Foot type and digits show no abnormalities. Bony prominences are unremarkable. Skin: No Porokeratosis. No infection or ulcers  Diagnosis:  Onychomycosis, , Pain in right toe, pain in left toes  Treatment & Plan Procedures and Treatment: Consent by patient was obtained for treatment procedures. The patient understood the discussion of treatment and procedures well. All questions were answered thoroughly reviewed. Debridement of mycotic and hypertrophic toenails, 1 through 5 bilateral and clearing of subungual debris. No ulceration, no infection noted.  Return Visit-Office Procedure: Patient instructed to return to the office for a follow up visit 10 weeks  for continued evaluation and treatment.   Aleyssa Pike DPM 

## 2016-08-03 DIAGNOSIS — Z961 Presence of intraocular lens: Secondary | ICD-10-CM | POA: Diagnosis not present

## 2016-08-03 DIAGNOSIS — H353130 Nonexudative age-related macular degeneration, bilateral, stage unspecified: Secondary | ICD-10-CM | POA: Diagnosis not present

## 2016-08-03 DIAGNOSIS — H3589 Other specified retinal disorders: Secondary | ICD-10-CM | POA: Diagnosis not present

## 2016-08-03 DIAGNOSIS — H3500 Unspecified background retinopathy: Secondary | ICD-10-CM | POA: Diagnosis not present

## 2016-08-03 DIAGNOSIS — Z947 Corneal transplant status: Secondary | ICD-10-CM | POA: Diagnosis not present

## 2016-08-31 DIAGNOSIS — C670 Malignant neoplasm of trigone of bladder: Secondary | ICD-10-CM | POA: Diagnosis not present

## 2016-08-31 DIAGNOSIS — R350 Frequency of micturition: Secondary | ICD-10-CM | POA: Diagnosis not present

## 2016-08-31 DIAGNOSIS — N401 Enlarged prostate with lower urinary tract symptoms: Secondary | ICD-10-CM | POA: Diagnosis not present

## 2016-09-09 ENCOUNTER — Ambulatory Visit (INDEPENDENT_AMBULATORY_CARE_PROVIDER_SITE_OTHER): Payer: Medicare Other | Admitting: Podiatry

## 2016-09-09 ENCOUNTER — Encounter: Payer: Self-pay | Admitting: Podiatry

## 2016-09-09 VITALS — Ht 70.0 in | Wt 165.0 lb

## 2016-09-09 DIAGNOSIS — M79676 Pain in unspecified toe(s): Secondary | ICD-10-CM

## 2016-09-09 DIAGNOSIS — B351 Tinea unguium: Secondary | ICD-10-CM | POA: Diagnosis not present

## 2016-09-09 NOTE — Progress Notes (Signed)
Patient ID: Zachary Obrien, male   DOB: 07/15/1921, 82 y.o.   MRN: [id number] Complaint:  Visit Type: Patient returns to my office for continued preventative foot care services. Complaint: Patient states" my nails have grown long and thick and become painful to walk and wear shoes" . The patient presents for preventative foot care services. No changes to ROS.  Patient is legally blind.  Podiatric Exam: Vascular: dorsalis pedis and posterior tibial pulses are palpable bilateral. Capillary return is immediate. Temperature gradient is WNL. Skin turgor WNL  Sensorium: Normal Semmes Weinstein monofilament test. Normal tactile sensation bilaterally. Nail Exam: Pt has thick disfigured discolored nails with subungual debris noted bilateral entire nail hallux through fifth toenails Ulcer Exam: There is no evidence of ulcer or pre-ulcerative changes or infection. Orthopedic Exam: Muscle tone and strength are WNL. No limitations in general ROM. No crepitus or effusions noted. Foot type and digits show no abnormalities. Bony prominences are unremarkable. Skin: No Porokeratosis. No infection or ulcers  Diagnosis:  Onychomycosis, , Pain in right toe, pain in left toes  Treatment & Plan Procedures and Treatment: Consent by patient was obtained for treatment procedures. The patient understood the discussion of treatment and procedures well. All questions were answered thoroughly reviewed. Debridement of mycotic and hypertrophic toenails, 1 through 5 bilateral and clearing of subungual debris. No ulceration, no infection noted.  Return Visit-Office Procedure: Patient instructed to return to the office for a follow up visit 10 weeks  for continued evaluation and treatment.   Kamyah Wilhelmsen DPM 

## 2016-09-16 DIAGNOSIS — M179 Osteoarthritis of knee, unspecified: Secondary | ICD-10-CM | POA: Diagnosis not present

## 2016-09-16 DIAGNOSIS — B351 Tinea unguium: Secondary | ICD-10-CM | POA: Diagnosis not present

## 2016-09-16 DIAGNOSIS — H3530 Unspecified macular degeneration: Secondary | ICD-10-CM | POA: Diagnosis not present

## 2016-09-16 DIAGNOSIS — Z79899 Other long term (current) drug therapy: Secondary | ICD-10-CM | POA: Diagnosis not present

## 2016-09-16 DIAGNOSIS — R269 Unspecified abnormalities of gait and mobility: Secondary | ICD-10-CM | POA: Diagnosis not present

## 2016-09-16 DIAGNOSIS — N401 Enlarged prostate with lower urinary tract symptoms: Secondary | ICD-10-CM | POA: Diagnosis not present

## 2016-09-16 DIAGNOSIS — H548 Legal blindness, as defined in USA: Secondary | ICD-10-CM | POA: Diagnosis not present

## 2016-09-16 DIAGNOSIS — C679 Malignant neoplasm of bladder, unspecified: Secondary | ICD-10-CM | POA: Diagnosis not present

## 2016-10-08 DIAGNOSIS — B351 Tinea unguium: Secondary | ICD-10-CM | POA: Diagnosis not present

## 2016-10-08 DIAGNOSIS — Z79899 Other long term (current) drug therapy: Secondary | ICD-10-CM | POA: Diagnosis not present

## 2016-10-21 DIAGNOSIS — M1712 Unilateral primary osteoarthritis, left knee: Secondary | ICD-10-CM | POA: Diagnosis not present

## 2016-11-05 DIAGNOSIS — Z79899 Other long term (current) drug therapy: Secondary | ICD-10-CM | POA: Diagnosis not present

## 2016-11-05 DIAGNOSIS — B351 Tinea unguium: Secondary | ICD-10-CM | POA: Diagnosis not present

## 2016-11-17 ENCOUNTER — Encounter: Payer: Self-pay | Admitting: Podiatry

## 2016-11-17 ENCOUNTER — Ambulatory Visit (INDEPENDENT_AMBULATORY_CARE_PROVIDER_SITE_OTHER): Payer: Medicare Other | Admitting: Podiatry

## 2016-11-17 VITALS — Ht 70.0 in | Wt 165.0 lb

## 2016-11-17 DIAGNOSIS — B351 Tinea unguium: Secondary | ICD-10-CM | POA: Diagnosis not present

## 2016-11-17 NOTE — Progress Notes (Signed)
Patient ID: Zachary Obrien, male   DOB: 12/29/1920, 83 y.o.   MRN: [id number] Complaint:  Visit Type: Patient returns to my office for continued preventative foot care services. Complaint: Patient states" my nails have grown long and thick and become painful to walk and wear shoes" . The patient presents for preventative foot care services. No changes to ROS.  Patient is legally blind.  Podiatric Exam: Vascular: dorsalis pedis and posterior tibial pulses are palpable bilateral. Capillary return is immediate. Temperature gradient is WNL. Skin turgor WNL  Sensorium: Normal Semmes Weinstein monofilament test. Normal tactile sensation bilaterally. Nail Exam: Pt has thick disfigured discolored nails with subungual debris noted bilateral entire nail hallux through fifth toenails Ulcer Exam: There is no evidence of ulcer or pre-ulcerative changes or infection. Orthopedic Exam: Muscle tone and strength are WNL. No limitations in general ROM. No crepitus or effusions noted. Foot type and digits show no abnormalities. Bony prominences are unremarkable. Skin: No Porokeratosis. No infection or ulcers  Diagnosis:  Onychomycosis, , Pain in right toe, pain in left toes  Treatment & Plan Procedures and Treatment: Consent by patient was obtained for treatment procedures. The patient understood the discussion of treatment and procedures well. All questions were answered thoroughly reviewed. Debridement of mycotic and hypertrophic toenails, 1 through 5 bilateral and clearing of subungual debris. No ulceration, no infection noted.  Return Visit-Office Procedure: Patient instructed to return to the office for a follow up visit 10 weeks  for continued evaluation and treatment.   Ritvik Mczeal DPM 

## 2016-12-27 DIAGNOSIS — L820 Inflamed seborrheic keratosis: Secondary | ICD-10-CM | POA: Diagnosis not present

## 2016-12-27 DIAGNOSIS — R238 Other skin changes: Secondary | ICD-10-CM | POA: Diagnosis not present

## 2016-12-29 ENCOUNTER — Other Ambulatory Visit: Payer: Self-pay | Admitting: Physician Assistant

## 2016-12-29 DIAGNOSIS — L280 Lichen simplex chronicus: Secondary | ICD-10-CM | POA: Diagnosis not present

## 2016-12-29 DIAGNOSIS — D492 Neoplasm of unspecified behavior of bone, soft tissue, and skin: Secondary | ICD-10-CM | POA: Diagnosis not present

## 2016-12-29 DIAGNOSIS — L309 Dermatitis, unspecified: Secondary | ICD-10-CM | POA: Diagnosis not present

## 2017-01-26 ENCOUNTER — Encounter: Payer: Self-pay | Admitting: Podiatry

## 2017-01-26 ENCOUNTER — Ambulatory Visit (INDEPENDENT_AMBULATORY_CARE_PROVIDER_SITE_OTHER): Payer: Medicare Other | Admitting: Podiatry

## 2017-01-26 DIAGNOSIS — B351 Tinea unguium: Secondary | ICD-10-CM | POA: Diagnosis not present

## 2017-01-26 NOTE — Progress Notes (Signed)
Patient ID: Zachary Obrien, male   DOB: 13-Sep-1921, 83 y.o.   MRN: [id number] Complaint:  Visit Type: Patient returns to my office for continued preventative foot care services. Complaint: Patient states" my nails have grown long and thick and become painful to walk and wear shoes" . The patient presents for preventative foot care services. No changes to ROS.  Patient is legally blind.  Podiatric Exam: Vascular: dorsalis pedis and posterior tibial pulses are palpable bilateral. Capillary return is immediate. Temperature gradient is WNL. Skin turgor WNL  Sensorium: Normal Semmes Weinstein monofilament test. Normal tactile sensation bilaterally. Nail Exam: Pt has thick disfigured discolored nails with subungual debris noted bilateral entire nail hallux through fifth toenails Ulcer Exam: There is no evidence of ulcer or pre-ulcerative changes or infection. Orthopedic Exam: Muscle tone and strength are WNL. No limitations in general ROM. No crepitus or effusions noted. Foot type and digits show no abnormalities. Bony prominences are unremarkable. Skin: No Porokeratosis. No infection or ulcers  Diagnosis:  Onychomycosis, , Pain in right toe, pain in left toes  Treatment & Plan Procedures and Treatment: Consent by patient was obtained for treatment procedures. The patient understood the discussion of treatment and procedures well. All questions were answered thoroughly reviewed. Debridement of mycotic and hypertrophic toenails, 1 through 5 bilateral and clearing of subungual debris. No ulceration, no infection noted.  Return Visit-Office Procedure: Patient instructed to return to the office for a follow up visit 10 weeks  for continued evaluation and treatment.   Gardiner Barefoot DPM

## 2017-02-08 DIAGNOSIS — L570 Actinic keratosis: Secondary | ICD-10-CM | POA: Diagnosis not present

## 2017-02-08 DIAGNOSIS — D692 Other nonthrombocytopenic purpura: Secondary | ICD-10-CM | POA: Diagnosis not present

## 2017-02-08 DIAGNOSIS — L309 Dermatitis, unspecified: Secondary | ICD-10-CM | POA: Diagnosis not present

## 2017-02-18 DIAGNOSIS — R05 Cough: Secondary | ICD-10-CM | POA: Diagnosis not present

## 2017-03-24 DIAGNOSIS — H353130 Nonexudative age-related macular degeneration, bilateral, stage unspecified: Secondary | ICD-10-CM | POA: Diagnosis not present

## 2017-03-24 DIAGNOSIS — H3589 Other specified retinal disorders: Secondary | ICD-10-CM | POA: Diagnosis not present

## 2017-04-06 ENCOUNTER — Ambulatory Visit (INDEPENDENT_AMBULATORY_CARE_PROVIDER_SITE_OTHER): Payer: Medicare Other | Admitting: Podiatry

## 2017-04-06 ENCOUNTER — Encounter: Payer: Self-pay | Admitting: Podiatry

## 2017-04-06 DIAGNOSIS — B351 Tinea unguium: Secondary | ICD-10-CM | POA: Diagnosis not present

## 2017-04-06 NOTE — Progress Notes (Signed)
Patient ID: Zachary Obrien, male   DOB: February 24, 1921, 83 y.o.   MRN: [id number] Complaint:  Visit Type: Patient returns to my office for continued preventative foot care services. Complaint: Patient states" my nails have grown long and thick and become painful to walk and wear shoes" . The patient presents for preventative foot care services. No changes to ROS.  Patient is legally blind.  Podiatric Exam: Vascular: dorsalis pedis and posterior tibial pulses are palpable bilateral. Capillary return is immediate. Temperature gradient is WNL. Skin turgor WNL  Sensorium: Normal Semmes Weinstein monofilament test. Normal tactile sensation bilaterally. Nail Exam: Pt has thick disfigured discolored nails with subungual debris noted bilateral entire nail hallux through fifth toenails Ulcer Exam: There is no evidence of ulcer or pre-ulcerative changes or infection. Orthopedic Exam: Muscle tone and strength are WNL. No limitations in general ROM. No crepitus or effusions noted. Foot type and digits show no abnormalities. Bony prominences are unremarkable. Skin: No Porokeratosis. No infection or ulcers  Diagnosis:  Onychomycosis, , Pain in right toe, pain in left toes  Treatment & Plan Procedures and Treatment: Consent by patient was obtained for treatment procedures. The patient understood the discussion of treatment and procedures well. All questions were answered thoroughly reviewed. Debridement of mycotic and hypertrophic toenails, 1 through 5 bilateral and clearing of subungual debris. No ulceration, no infection noted.  Return Visit-Office Procedure: Patient instructed to return to the office for a follow up visit 10 weeks  for continued evaluation and treatment.   Gardiner Barefoot DPM

## 2017-04-18 DIAGNOSIS — N401 Enlarged prostate with lower urinary tract symptoms: Secondary | ICD-10-CM | POA: Diagnosis not present

## 2017-04-18 DIAGNOSIS — Z79899 Other long term (current) drug therapy: Secondary | ICD-10-CM | POA: Diagnosis not present

## 2017-04-18 DIAGNOSIS — C679 Malignant neoplasm of bladder, unspecified: Secondary | ICD-10-CM | POA: Diagnosis not present

## 2017-04-18 DIAGNOSIS — Z0000 Encounter for general adult medical examination without abnormal findings: Secondary | ICD-10-CM | POA: Diagnosis not present

## 2017-04-18 DIAGNOSIS — M179 Osteoarthritis of knee, unspecified: Secondary | ICD-10-CM | POA: Diagnosis not present

## 2017-04-18 DIAGNOSIS — R269 Unspecified abnormalities of gait and mobility: Secondary | ICD-10-CM | POA: Diagnosis not present

## 2017-06-17 ENCOUNTER — Ambulatory Visit (INDEPENDENT_AMBULATORY_CARE_PROVIDER_SITE_OTHER): Payer: Medicare Other | Admitting: Podiatry

## 2017-06-17 ENCOUNTER — Encounter: Payer: Self-pay | Admitting: Podiatry

## 2017-06-17 DIAGNOSIS — M79676 Pain in unspecified toe(s): Secondary | ICD-10-CM | POA: Diagnosis not present

## 2017-06-17 DIAGNOSIS — B351 Tinea unguium: Secondary | ICD-10-CM | POA: Diagnosis not present

## 2017-06-17 NOTE — Progress Notes (Signed)
Patient ID: Zachary Obrien, male   DOB: 02/23/1921, 83 y.o.   MRN: [id number] Complaint:  Visit Type: Patient returns to my office for continued preventative foot care services. Complaint: Patient states" my nails have grown long and thick and become painful to walk and wear shoes" . The patient presents for preventative foot care services. No changes to ROS.  Patient is legally blind.  Podiatric Exam: Vascular: dorsalis pedis and posterior tibial pulses are palpable bilateral. Capillary return is immediate. Temperature gradient is WNL. Skin turgor WNL  Sensorium: Normal Semmes Weinstein monofilament test. Normal tactile sensation bilaterally. Nail Exam: Pt has thick disfigured discolored nails with subungual debris noted bilateral entire nail hallux through fifth toenails Ulcer Exam: There is no evidence of ulcer or pre-ulcerative changes or infection. Orthopedic Exam: Muscle tone and strength are WNL. No limitations in general ROM. No crepitus or effusions noted. Foot type and digits show no abnormalities. Bony prominences are unremarkable. Skin: No Porokeratosis. No infection or ulcers  Diagnosis:  Onychomycosis, , Pain in right toe, pain in left toes  Treatment & Plan Procedures and Treatment: Consent by patient was obtained for treatment procedures. The patient understood the discussion of treatment and procedures well. All questions were answered thoroughly reviewed. Debridement of mycotic and hypertrophic toenails, 1 through 5 bilateral and clearing of subungual debris. No ulceration, no infection noted.  Return Visit-Office Procedure: Patient instructed to return to the office for a follow up visit 10 weeks  for continued evaluation and treatment.   Lamyiah Crawshaw DPM 

## 2017-07-07 DIAGNOSIS — Z79899 Other long term (current) drug therapy: Secondary | ICD-10-CM | POA: Diagnosis not present

## 2017-07-07 DIAGNOSIS — R269 Unspecified abnormalities of gait and mobility: Secondary | ICD-10-CM | POA: Diagnosis not present

## 2017-07-07 DIAGNOSIS — R300 Dysuria: Secondary | ICD-10-CM | POA: Diagnosis not present

## 2017-07-07 DIAGNOSIS — R509 Fever, unspecified: Secondary | ICD-10-CM | POA: Diagnosis not present

## 2017-07-07 DIAGNOSIS — R634 Abnormal weight loss: Secondary | ICD-10-CM | POA: Diagnosis not present

## 2017-07-28 DIAGNOSIS — Z111 Encounter for screening for respiratory tuberculosis: Secondary | ICD-10-CM | POA: Diagnosis not present

## 2017-08-09 DIAGNOSIS — R350 Frequency of micturition: Secondary | ICD-10-CM | POA: Diagnosis not present

## 2017-08-09 DIAGNOSIS — C670 Malignant neoplasm of trigone of bladder: Secondary | ICD-10-CM | POA: Diagnosis not present

## 2017-08-09 DIAGNOSIS — N401 Enlarged prostate with lower urinary tract symptoms: Secondary | ICD-10-CM | POA: Diagnosis not present

## 2017-08-26 ENCOUNTER — Encounter: Payer: Self-pay | Admitting: Podiatry

## 2017-08-26 ENCOUNTER — Ambulatory Visit (INDEPENDENT_AMBULATORY_CARE_PROVIDER_SITE_OTHER): Payer: Medicare Other | Admitting: Podiatry

## 2017-08-26 DIAGNOSIS — M79676 Pain in unspecified toe(s): Secondary | ICD-10-CM | POA: Diagnosis not present

## 2017-08-26 DIAGNOSIS — B351 Tinea unguium: Secondary | ICD-10-CM | POA: Diagnosis not present

## 2017-08-26 NOTE — Progress Notes (Signed)
Patient ID: Zachary Obrien, male   DOB: [birth year], 83 y.o.   MRN: [id number] Complaint:  Visit Type: Patient returns to my office for continued preventative foot care services. Complaint: Patient states" my nails have grown long and thick and become painful to walk and wear shoes" . The patient presents for preventative foot care services. No changes to ROS.  Patient is legally blind.  Podiatric Exam: Vascular: dorsalis pedis and posterior tibial pulses are palpable bilateral. Capillary return is immediate. Temperature gradient is WNL. Skin turgor WNL  Sensorium: Normal Semmes Weinstein monofilament test. Normal tactile sensation bilaterally. Nail Exam: Pt has thick disfigured discolored nails with subungual debris noted bilateral entire nail hallux through fifth toenails Ulcer Exam: There is no evidence of ulcer or pre-ulcerative changes or infection. Orthopedic Exam: Muscle tone and strength are WNL. No limitations in general ROM. No crepitus or effusions noted. Foot type and digits show no abnormalities. Bony prominences are unremarkable. Skin: No Porokeratosis. No infection or ulcers  Diagnosis:  Onychomycosis, , Pain in right toe, pain in left toes  Treatment & Plan Procedures and Treatment: Consent by patient was obtained for treatment procedures. The patient understood the discussion of treatment and procedures well. All questions were answered thoroughly reviewed. Debridement of mycotic and hypertrophic toenails, 1 through 5 bilateral and clearing of subungual debris. No ulceration, no infection noted.  Return Visit-Office Procedure: Patient instructed to return to the office for a follow up visit 10 weeks  for continued evaluation and treatment.   Gardiner Barefoot DPM

## 2017-10-14 ENCOUNTER — Emergency Department (HOSPITAL_COMMUNITY)
Admission: EM | Admit: 2017-10-14 | Discharge: 2017-10-15 | Disposition: A | Discharge status: Home / Self Care | Payer: Medicare Other | Attending: Emergency Medicine | Admitting: Emergency Medicine

## 2017-10-14 ENCOUNTER — Encounter (HOSPITAL_COMMUNITY): Payer: Self-pay | Admitting: Emergency Medicine

## 2017-10-14 ENCOUNTER — Other Ambulatory Visit: Payer: Self-pay

## 2017-10-14 DIAGNOSIS — S098XXA Other specified injuries of head, initial encounter: Secondary | ICD-10-CM | POA: Diagnosis not present

## 2017-10-14 DIAGNOSIS — Z87891 Personal history of nicotine dependence: Secondary | ICD-10-CM | POA: Insufficient documentation

## 2017-10-14 DIAGNOSIS — S0990XA Unspecified injury of head, initial encounter: Secondary | ICD-10-CM | POA: Diagnosis present

## 2017-10-14 DIAGNOSIS — S0101XA Laceration without foreign body of scalp, initial encounter: Secondary | ICD-10-CM | POA: Diagnosis not present

## 2017-10-14 DIAGNOSIS — Y92122 Bedroom in nursing home as the place of occurrence of the external cause: Secondary | ICD-10-CM | POA: Diagnosis not present

## 2017-10-14 DIAGNOSIS — Y9389 Activity, other specified: Secondary | ICD-10-CM | POA: Diagnosis not present

## 2017-10-14 DIAGNOSIS — Y999 Unspecified external cause status: Secondary | ICD-10-CM | POA: Diagnosis not present

## 2017-10-14 DIAGNOSIS — Z96651 Presence of right artificial knee joint: Secondary | ICD-10-CM | POA: Insufficient documentation

## 2017-10-14 DIAGNOSIS — W01190A Fall on same level from slipping, tripping and stumbling with subsequent striking against furniture, initial encounter: Secondary | ICD-10-CM | POA: Diagnosis not present

## 2017-10-14 DIAGNOSIS — S0993XA Unspecified injury of face, initial encounter: Secondary | ICD-10-CM | POA: Diagnosis not present

## 2017-10-14 DIAGNOSIS — W19XXXA Unspecified fall, initial encounter: Secondary | ICD-10-CM

## 2017-10-14 DIAGNOSIS — Z79899 Other long term (current) drug therapy: Secondary | ICD-10-CM | POA: Diagnosis not present

## 2017-10-14 MED ORDER — LIDOCAINE-EPINEPHRINE-TETRACAINE (LET) SOLUTION
3.0000 mL | Freq: Once | NASAL | Status: AC
  Administered 2017-10-15: 3 mL via TOPICAL
  Filled 2017-10-14: qty 3

## 2017-10-14 NOTE — ED Triage Notes (Signed)
Patient BIB [facility] from [facility] for unwitnessed fall. Pt was trying to change his pants and fell, hitting his head on the corner of his nightstand. Pt sustained 1-2 inch laceration to rt upper head. Bleeding controled upon EMS arrival to facility. Pt a/o x4. At norm per facility. Pt ambulates with a walker.

## 2017-10-14 NOTE — ED Notes (Signed)
Bed: HB71 Expected date:  Expected time:  Means of arrival:  Comments: 83yo M/SNF FALL

## 2017-10-15 ENCOUNTER — Other Ambulatory Visit: Payer: Self-pay

## 2017-10-15 NOTE — Discharge Instructions (Signed)
Since she did not want your scans of your head tonight if you develop any nausea, headaches, vision changes, change in mental status return to the ED immediately for CAT scan.   WOUND CARE Please have your stitches/staples removed in 5-7 or sooner if you have concerns. You may do this at any available urgent care or at your primary care doctor's office.  Keep area clean and dry for 24 hours. Do not remove bandage, if applied.  After 24 hours, remove bandage and wash wound gently with mild soap and warm water. Reapply a new bandage after cleaning wound, if directed.  Continue daily cleansing with soap and water until stitches/staples are removed.  Do not apply any ointments or creams to the wound while stitches/staples are in place, as this may cause delayed healing.  Seek medical careif you experience any of the following signs of infection: Swelling, redness, pus drainage, streaking, fever >101.0 F  Seek care if you experience excessive bleeding that does not stop after 15-20 minutes of constant, firm pressure.

## 2017-10-15 NOTE — ED Provider Notes (Signed)
[facility] DEPT Provider Note   CSN: [id number] Arrival date & time: 10/14/17  2258     History   Chief Complaint Chief Complaint  Patient presents with  . Fall    HPI Zachary Obrien is a 83 y.o. male.  HPI 83 year old male with no pertinent past medical history presents by EMS from nursing facility for unwitnessed fall.  Patient states that he was trying to change his pants and lost his balance and fell hitting the right side of his head on the corner of the nightstand.  Patient denies any LOC.  Was helped up by the facility staff.  Transported the ED for laceration.  Ambulates at baseline with a walker.  States his tetanus shot is up-to-date.  Patient denies any neck pain, back pain, chest pain, abdominal pain.  Has been ambulatory since the event.  Denies any other associated symptoms.  Pt denies any fever, chill, ha, vision changes, lightheadedness, dizziness, congestion, neck pain, cp, sob, cough, abd pain, n/v/d, urinary symptoms, change in bowel habits, melena, hematochezia, lower extremity paresthesias.  Past Medical History:  Diagnosis Date  . Bladder cancer ([place])   . Dermatophytosis of nail   . No pertinent past medical history     Patient Active Problem List   Diagnosis Date Noted  . CAP (community acquired pneumonia) 11/08/2015  . Pneumonia 11/08/2015  . Dermatophytosis of nail   . Bladder cancer ([place]) 10/25/2012    Past Surgical History:  Procedure Laterality Date  . bilatera lens replacement sfor cataracts  age 52  . CYSTOSCOPY  09/13/2012   Procedure: CYSTOSCOPY;  Surgeon: Molli Hazard, MD;  Location: [facility];  Service: Urology;  Laterality: N/A;  . CYSTOSCOPY WITH BIOPSY  10/25/2012   Procedure: CYSTOSCOPY WITH BIOPSY;  Surgeon: Molli Hazard, MD;  Location: [facility];  Service: Urology;  Laterality: N/A;  . detached retina surgery left eye  age 63  . EYE SURGERY  age 58   partial cornea tranplants  .  hydrocele surgery  age 34  . JOINT REPLACEMENT  2010   right knee replacement  . TONSILLECTOMY  age 29  . TRANSURETHRAL RESECTION OF BLADDER TUMOR  09/13/2012   Procedure: TRANSURETHRAL RESECTION OF BLADDER TUMOR (TURBT);  Surgeon: Molli Hazard, MD;  Location: [facility];  Service: Urology;  Laterality: N/A;  fulguration of bladder tumor       Home Medications    Prior to Admission medications   Medication Sig Start Date End Date Taking? Authorizing Provider  Cholecalciferol (VITAMIN D PO) Take 1 tablet by mouth daily.    [provider]  ferrous sulfate 325 (65 FE) MG tablet Take 325 mg by mouth 2 (two) times daily with a meal.    [provider]  finasteride (PROSCAR) 5 MG tablet Take 5 mg by mouth daily.     [provider]  ibuprofen (ADVIL,MOTRIN) 200 MG tablet Take 400 mg by mouth every 6 (six) hours as needed (pain).    [provider]  levofloxacin (LEVAQUIN) 750 MG tablet Take 1 tablet (750 mg total) by mouth every other day. 11/10/15   Oswald Hillock, MD  PRESCRIPTION MEDICATION Apply 1 application topically See admin instructions. Medication for fingernail fungus - apply to fingernail daily for 6 days (Monday thru Saturday), wipe off with alcohol swab on Sunday, repeat until fungus is gone    [provider]  UNABLE TO FIND Take by mouth.    [provider]  vitamin B-12 (CYANOCOBALAMIN) 1000 MCG tablet Take by mouth.    [provider]    Family History History reviewed. No pertinent family history.  Social History Social History   Tobacco Use  . Smoking status: Former Smoker    Packs/day: 2.00    Years: 20.00    Pack years: 40.00    Types: Cigarettes    Last attempt to quit: 11/08/1965    Years since quitting: 51.9  . Smokeless tobacco: Never Used  Substance Use Topics  . Alcohol use: Yes    Alcohol/week: 1.2 oz    Types: 2 Glasses of wine per week  . Drug use: No     Allergies   Patient has no  known allergies.   Review of Systems Review of Systems  Constitutional: Negative for chills and fever.  HENT: Negative for congestion and sore throat.   Eyes: Negative for visual disturbance.  Respiratory: Negative for cough and shortness of breath.   Cardiovascular: Negative for chest pain.  Gastrointestinal: Negative for abdominal pain, diarrhea, nausea and vomiting.  Genitourinary: Negative for dysuria, flank pain, frequency, hematuria and urgency.  Musculoskeletal: Negative for arthralgias, back pain, myalgias, neck pain and neck stiffness.  Skin: Positive for wound. Negative for rash.  Neurological: Negative for dizziness, syncope, weakness, light-headedness, numbness and headaches.  Psychiatric/Behavioral: Negative for sleep disturbance. The patient is not nervous/anxious.      Physical Exam Updated Vital Signs BP 136/86   Pulse (!) 106   Temp 98.4 F (36.9 C) (Oral)   Resp 18   Ht 5' 7.5" (1.715 m)   Wt 76.2 kg (168 lb)   SpO2 97%   BMI 25.92 kg/m   Physical Exam  Constitutional: He is oriented to person, place, and time. He appears well-developed and well-nourished.  Non-toxic appearance. No distress.  HENT:  Head: Normocephalic.    Mouth/Throat: Oropharynx is clear and moist.  Patient has a 4 different lacerations to the right side of his head.  One laceration is approximately 4 cm in length.  Second and third laceration is approximately 3 cm in length.  Fourth laceration is approx 1 cm in length.  No skull depression noted.  No bilateral hemotympanum.  No septal hematoma.  Patient has no raccoon eyes or battle sign.  Eyes: Conjunctivae and EOM are normal. Pupils are equal, round, and reactive to light. Right eye exhibits no discharge. Left eye exhibits no discharge.  Neck: Normal range of motion. Neck supple.  No c spine midline tenderness. No paraspinal tenderness. No deformities or step offs noted. Full ROM. Supple. No nuchal rigidity.    Cardiovascular: Normal  rate, regular rhythm, normal heart sounds and intact distal pulses. Exam reveals no gallop and no friction rub.  No murmur heard. Pulmonary/Chest: Effort normal and breath sounds normal. No stridor. No respiratory distress. He has no wheezes. He has no rales. He exhibits no tenderness.  Abdominal: Soft. Bowel sounds are normal. He exhibits no distension. There is no tenderness. There is no rebound and no guarding.  Musculoskeletal: Normal range of motion. He exhibits no tenderness.  No midline T spine or L spine tenderness. No deformities or step offs noted. Full ROM. Pelvis is stable.   Lymphadenopathy:    He has no cervical adenopathy.  Neurological: He is alert and oriented to person, place, and time.  The patient is alert, attentive, and oriented x 3. Speech is clear. Cranial nerve II-VII grossly intact. Negative pronator drift. Sensation intact. Strength 5/5 in all extremities.  Reflexes 2+ and symmetric at biceps, triceps, knees, and ankles. Rapid alternating movement and fine finger movements intact.    Skin: Skin is warm and dry. Capillary refill takes less than 2 seconds. No rash noted.  Psychiatric: His behavior is normal. Judgment and thought content normal.  Nursing note and vitals reviewed.    ED Treatments / Results  Labs (all labs ordered are listed, but only abnormal results are displayed) Labs Reviewed - No data to display  EKG  EKG Interpretation None       Radiology No results found.  Procedures .Marland KitchenLaceration Repair Date/Time: 10/15/2017 8:51 AM Performed by: Doristine Devoid, PA-C Authorized by: Doristine Devoid, PA-C   Consent:    Consent obtained:  Verbal   Consent given by:  Patient   Risks discussed:  Infection, need for additional repair, nerve damage, poor wound healing, poor cosmetic result, pain, retained foreign body, tendon damage and vascular damage   Alternatives discussed:  No treatment Anesthesia (see MAR for exact dosages):     Anesthesia method:  Topical application   Topical anesthetic:  LET Laceration details:    Location:  Scalp   Wound length (cm): Patient with 4 separate lacerations to the right parietal region varying different lengths please see my complete physical. Repair type:    Repair type:  Simple Pre-procedure details:    Preparation:  Patient was prepped and draped in usual sterile fashion Exploration:    Hemostasis achieved with:  Direct pressure and LET   Wound exploration: wound explored through full range of motion and entire depth of wound probed and visualized     Wound extent: no foreign bodies/material noted     Contaminated: no   Treatment:    Area cleansed with:  Hibiclens   Amount of cleaning:  Standard   Irrigation solution:  Sterile water and sterile saline   Irrigation volume:  100   Irrigation method:  Pressure wash   Visualized foreign bodies/material removed: no   Skin repair:    Repair method:  Staples   Number of staples:  12 Approximation:    Approximation:  Close   Vermilion border: well-aligned   Post-procedure details:    Dressing:  Antibiotic ointment and bulky dressing   Patient tolerance of procedure:  Tolerated well, no immediate complications   (including critical care time)  Medications Ordered in ED Medications  lidocaine-EPINEPHrine-tetracaine (LET) solution (3 mLs Topical Given 10/15/17 0006)     Initial Impression / Assessment and Plan / ED Course  I have reviewed the triage vital signs and the nursing notes.  Pertinent labs & imaging results that were available during my care of the patient were reviewed by me and considered in my medical decision making (see chart for details).     Patient presents to the ED for evaluation of mechanical fall and laceration to the right side of the head.  Patient's tetanus shot is up-to-date.  Denies any LOC.  Patient has been ambulatory since the event.  No other signs of trauma noted.  Abdominal exam is benign.   Lungs clear to auscultation bilaterally.  Full range of motion of cervical spine.  Pelvis is stable.  Neurovascularly intact in all extremities no focal neuro deficit on exam.  I did discuss with the patient and the family that CT scan of head and neck would be performed.  They were adamant that patient does not need a CT scan.  Discussed with him the risk of head bleeds or skull  fractures.  They understand the risk and shared decision was making and they decided not to obtain imaging at this time.  Laceration was repaired by myself with staples.  Patient tolerated the procedure without any difficulties.  Discussed follow-up for removal of staples.  Wound was thoroughly irrigated.  Patient has no significant comorbidities that would increase risk of infection.  Pt is hemodynamically stable, in NAD, & able to ambulate in the ED. Evaluation does not show pathology that would require ongoing emergent intervention or inpatient treatment. I explained the diagnosis to the patient. Pain has been managed & has no complaints prior to dc. Pt is comfortable with above plan and is stable for discharge at this time. All questions were answered prior to disposition. Strict return precautions for f/u to the ED were discussed. Encouraged follow up with PCP.  Patient seen and evaluated by attending Dr. Tyrone Nine who is agreed with the above plan.  Final Clinical Impressions(s) / ED Diagnoses   Final diagnoses:  Fall, initial encounter  Laceration of scalp, initial encounter    ED Discharge Orders    None       Aaron Edelman 10/15/17 [place], [place], DO 10/15/17 2304

## 2017-10-19 DIAGNOSIS — M179 Osteoarthritis of knee, unspecified: Secondary | ICD-10-CM | POA: Diagnosis not present

## 2017-10-19 DIAGNOSIS — H3530 Unspecified macular degeneration: Secondary | ICD-10-CM | POA: Diagnosis not present

## 2017-10-19 DIAGNOSIS — N401 Enlarged prostate with lower urinary tract symptoms: Secondary | ICD-10-CM | POA: Diagnosis not present

## 2017-10-19 DIAGNOSIS — S0191XA Laceration without foreign body of unspecified part of head, initial encounter: Secondary | ICD-10-CM | POA: Diagnosis not present

## 2017-11-09 ENCOUNTER — Ambulatory Visit (INDEPENDENT_AMBULATORY_CARE_PROVIDER_SITE_OTHER): Payer: Medicare Other | Admitting: Podiatry

## 2017-11-09 ENCOUNTER — Encounter: Payer: Self-pay | Admitting: Podiatry

## 2017-11-09 DIAGNOSIS — M79676 Pain in unspecified toe(s): Secondary | ICD-10-CM

## 2017-11-09 DIAGNOSIS — B351 Tinea unguium: Secondary | ICD-10-CM

## 2017-11-09 NOTE — Progress Notes (Signed)
Patient ID: Zachary Obrien, male   DOB: 03/31/1921, 84 y.o.   MRN: [id number] Complaint:  Visit Type: Patient returns to my office for continued preventative foot care services. Complaint: Patient states" my nails have grown long and thick and become painful to walk and wear shoes" . The patient presents for preventative foot care services. No changes to ROS.  Patient is legally blind.  Podiatric Exam: Vascular: dorsalis pedis and posterior tibial pulses are palpable bilateral. Capillary return is immediate. Temperature gradient is WNL. Skin turgor WNL  Sensorium: Normal Semmes Weinstein monofilament test. Normal tactile sensation bilaterally. Nail Exam: Pt has thick disfigured discolored nails with subungual debris noted bilateral entire nail hallux through fifth toenails Ulcer Exam: There is no evidence of ulcer or pre-ulcerative changes or infection. Orthopedic Exam: Muscle tone and strength are WNL. No limitations in general ROM. No crepitus or effusions noted. Foot type and digits show no abnormalities. Bony prominences are unremarkable. Skin: No Porokeratosis. No infection or ulcers  Diagnosis:  Onychomycosis, , Pain in right toe, pain in left toes  Treatment & Plan Procedures and Treatment: Consent by patient was obtained for treatment procedures. The patient understood the discussion of treatment and procedures well. All questions were answered thoroughly reviewed. Debridement of mycotic and hypertrophic toenails, 1 through 5 bilateral and clearing of subungual debris. No ulceration, no infection noted.  Return Visit-Office Procedure: Patient instructed to return to the office for a follow up visit 10 weeks  for continued evaluation and treatment.   Gardiner Barefoot DPM

## 2018-01-17 ENCOUNTER — Ambulatory Visit (INDEPENDENT_AMBULATORY_CARE_PROVIDER_SITE_OTHER): Payer: Medicare Other | Admitting: Podiatry

## 2018-01-17 ENCOUNTER — Encounter: Payer: Self-pay | Admitting: Podiatry

## 2018-01-17 DIAGNOSIS — M79676 Pain in unspecified toe(s): Secondary | ICD-10-CM | POA: Diagnosis not present

## 2018-01-17 DIAGNOSIS — B351 Tinea unguium: Secondary | ICD-10-CM | POA: Diagnosis not present

## 2018-01-17 NOTE — Progress Notes (Signed)
Patient ID: Zachary Obrien, male   DOB: [birth year], 84 y.o.   MRN: [id number] Complaint:  Visit Type: Patient returns to my office for continued preventative foot care services. Complaint: Patient states" my nails have grown long and thick and become painful to walk and wear shoes" . The patient presents for preventative foot care services. No changes to ROS.  Patient is legally blind.  Podiatric Exam: Vascular: dorsalis pedis and posterior tibial pulses are palpable bilateral. Capillary return is immediate. Temperature gradient is WNL. Skin turgor WNL  Sensorium: Normal Semmes Weinstein monofilament test. Normal tactile sensation bilaterally. Nail Exam: Pt has thick disfigured discolored nails with subungual debris noted bilateral entire nail hallux through fifth toenails Ulcer Exam: There is no evidence of ulcer or pre-ulcerative changes or infection. Orthopedic Exam: Muscle tone and strength are WNL. No limitations in general ROM. No crepitus or effusions noted. Foot type and digits show no abnormalities. Bony prominences are unremarkable. Skin: No Porokeratosis. No infection or ulcers  Diagnosis:  Onychomycosis, , Pain in right toe, pain in left toes  Treatment & Plan Procedures and Treatment: Consent by patient was obtained for treatment procedures. The patient understood the discussion of treatment and procedures well. All questions were answered thoroughly reviewed. Debridement of mycotic and hypertrophic toenails, 1 through 5 bilateral and clearing of subungual debris. No ulceration, no infection noted.  Return Visit-Office Procedure: Patient instructed to return to the office for a follow up visit 10 weeks  for continued evaluation and treatment.   Gardiner Barefoot DPM

## 2018-02-15 DIAGNOSIS — C679 Malignant neoplasm of bladder, unspecified: Secondary | ICD-10-CM | POA: Diagnosis not present

## 2018-02-15 DIAGNOSIS — M179 Osteoarthritis of knee, unspecified: Secondary | ICD-10-CM | POA: Diagnosis not present

## 2018-02-15 DIAGNOSIS — N401 Enlarged prostate with lower urinary tract symptoms: Secondary | ICD-10-CM | POA: Diagnosis not present

## 2018-02-15 DIAGNOSIS — R269 Unspecified abnormalities of gait and mobility: Secondary | ICD-10-CM | POA: Diagnosis not present

## 2018-02-18 DIAGNOSIS — D509 Iron deficiency anemia, unspecified: Secondary | ICD-10-CM | POA: Diagnosis not present

## 2018-02-18 DIAGNOSIS — M1712 Unilateral primary osteoarthritis, left knee: Secondary | ICD-10-CM | POA: Diagnosis not present

## 2018-02-18 DIAGNOSIS — C679 Malignant neoplasm of bladder, unspecified: Secondary | ICD-10-CM | POA: Diagnosis not present

## 2018-02-18 DIAGNOSIS — N401 Enlarged prostate with lower urinary tract symptoms: Secondary | ICD-10-CM | POA: Diagnosis not present

## 2018-02-18 DIAGNOSIS — H548 Legal blindness, as defined in USA: Secondary | ICD-10-CM | POA: Diagnosis not present

## 2018-02-18 DIAGNOSIS — R32 Unspecified urinary incontinence: Secondary | ICD-10-CM | POA: Diagnosis not present

## 2018-03-16 DIAGNOSIS — R350 Frequency of micturition: Secondary | ICD-10-CM | POA: Diagnosis not present

## 2018-03-28 ENCOUNTER — Encounter: Payer: Self-pay | Admitting: Podiatry

## 2018-03-28 ENCOUNTER — Ambulatory Visit (INDEPENDENT_AMBULATORY_CARE_PROVIDER_SITE_OTHER): Payer: Medicare Other | Admitting: Podiatry

## 2018-03-28 DIAGNOSIS — M79676 Pain in unspecified toe(s): Secondary | ICD-10-CM | POA: Diagnosis not present

## 2018-03-28 DIAGNOSIS — B351 Tinea unguium: Secondary | ICD-10-CM

## 2018-03-28 NOTE — Progress Notes (Signed)
Patient ID: Zachary Obrien, male   DOB: [birth year], 84 y.o.   MRN: [id number] Complaint:  Visit Type: Patient returns to my office for continued preventative foot care services. Complaint: Patient states" my nails have grown long and thick and become painful to walk and wear shoes" . The patient presents for preventative foot care services. No changes to ROS.  Patient is legally blind.  Podiatric Exam: Vascular: dorsalis pedis and posterior tibial pulses are palpable bilateral. Capillary return is immediate. Temperature gradient is WNL. Skin turgor WNL  Sensorium: Normal Semmes Weinstein monofilament test. Normal tactile sensation bilaterally. Nail Exam: Pt has thick disfigured discolored nails with subungual debris noted bilateral entire nail hallux through fifth toenails Ulcer Exam: There is no evidence of ulcer or pre-ulcerative changes or infection. Orthopedic Exam: Muscle tone and strength are WNL. No limitations in general ROM. No crepitus or effusions noted. Foot type and digits show no abnormalities. Bony prominences are unremarkable. Skin: No Porokeratosis. No infection or ulcers  Diagnosis:  Onychomycosis, , Pain in right toe, pain in left toes  Treatment & Plan Procedures and Treatment: Consent by patient was obtained for treatment procedures. The patient understood the discussion of treatment and procedures well. All questions were answered thoroughly reviewed. Debridement of mycotic and hypertrophic toenails, 1 through 5 bilateral and clearing of subungual debris. No ulceration, no infection noted.  Return Visit-Office Procedure: Patient instructed to return to the office for a follow up visit 10 weeks  for continued evaluation and treatment.   Gardiner Barefoot DPM

## 2018-04-24 DIAGNOSIS — C679 Malignant neoplasm of bladder, unspecified: Secondary | ICD-10-CM | POA: Diagnosis not present

## 2018-04-24 DIAGNOSIS — R32 Unspecified urinary incontinence: Secondary | ICD-10-CM | POA: Diagnosis not present

## 2018-04-24 DIAGNOSIS — H548 Legal blindness, as defined in USA: Secondary | ICD-10-CM | POA: Diagnosis not present

## 2018-04-24 DIAGNOSIS — N401 Enlarged prostate with lower urinary tract symptoms: Secondary | ICD-10-CM | POA: Diagnosis not present

## 2018-04-24 DIAGNOSIS — D509 Iron deficiency anemia, unspecified: Secondary | ICD-10-CM | POA: Diagnosis not present

## 2018-04-24 DIAGNOSIS — M1712 Unilateral primary osteoarthritis, left knee: Secondary | ICD-10-CM | POA: Diagnosis not present

## 2018-04-27 DIAGNOSIS — Z0001 Encounter for general adult medical examination with abnormal findings: Secondary | ICD-10-CM | POA: Diagnosis not present

## 2018-04-27 DIAGNOSIS — N401 Enlarged prostate with lower urinary tract symptoms: Secondary | ICD-10-CM | POA: Diagnosis not present

## 2018-04-27 DIAGNOSIS — R269 Unspecified abnormalities of gait and mobility: Secondary | ICD-10-CM | POA: Diagnosis not present

## 2018-04-27 DIAGNOSIS — Z1389 Encounter for screening for other disorder: Secondary | ICD-10-CM | POA: Diagnosis not present

## 2018-05-01 ENCOUNTER — Encounter (HOSPITAL_COMMUNITY): Payer: Self-pay

## 2018-05-01 ENCOUNTER — Emergency Department (HOSPITAL_COMMUNITY): Payer: Medicare Other

## 2018-05-01 ENCOUNTER — Emergency Department (HOSPITAL_COMMUNITY)
Admission: EM | Admit: 2018-05-01 | Discharge: 2018-05-02 | Disposition: A | Discharge status: Home / Self Care | Payer: Medicare Other | Attending: Emergency Medicine | Admitting: Emergency Medicine

## 2018-05-01 DIAGNOSIS — S0990XA Unspecified injury of head, initial encounter: Secondary | ICD-10-CM

## 2018-05-01 DIAGNOSIS — R58 Hemorrhage, not elsewhere classified: Secondary | ICD-10-CM | POA: Diagnosis not present

## 2018-05-01 DIAGNOSIS — Z7982 Long term (current) use of aspirin: Secondary | ICD-10-CM | POA: Insufficient documentation

## 2018-05-01 DIAGNOSIS — Y9389 Activity, other specified: Secondary | ICD-10-CM | POA: Insufficient documentation

## 2018-05-01 DIAGNOSIS — Z96651 Presence of right artificial knee joint: Secondary | ICD-10-CM | POA: Diagnosis not present

## 2018-05-01 DIAGNOSIS — Y92129 Unspecified place in nursing home as the place of occurrence of the external cause: Secondary | ICD-10-CM | POA: Insufficient documentation

## 2018-05-01 DIAGNOSIS — Y92009 Unspecified place in unspecified non-institutional (private) residence as the place of occurrence of the external cause: Secondary | ICD-10-CM

## 2018-05-01 DIAGNOSIS — W19XXXA Unspecified fall, initial encounter: Secondary | ICD-10-CM

## 2018-05-01 DIAGNOSIS — W0110XA Fall on same level from slipping, tripping and stumbling with subsequent striking against unspecified object, initial encounter: Secondary | ICD-10-CM | POA: Diagnosis not present

## 2018-05-01 DIAGNOSIS — Z87891 Personal history of nicotine dependence: Secondary | ICD-10-CM | POA: Insufficient documentation

## 2018-05-01 DIAGNOSIS — Y999 Unspecified external cause status: Secondary | ICD-10-CM | POA: Diagnosis not present

## 2018-05-01 DIAGNOSIS — Z79899 Other long term (current) drug therapy: Secondary | ICD-10-CM | POA: Insufficient documentation

## 2018-05-01 DIAGNOSIS — Z8551 Personal history of malignant neoplasm of bladder: Secondary | ICD-10-CM | POA: Insufficient documentation

## 2018-05-01 DIAGNOSIS — S0003XA Contusion of scalp, initial encounter: Secondary | ICD-10-CM

## 2018-05-01 DIAGNOSIS — R609 Edema, unspecified: Secondary | ICD-10-CM | POA: Diagnosis not present

## 2018-05-01 HISTORY — DX: Benign prostatic hyperplasia without lower urinary tract symptoms: N40.0

## 2018-05-01 HISTORY — DX: Unspecified osteoarthritis, unspecified site: M19.90

## 2018-05-01 HISTORY — DX: Neoplasm of unspecified behavior of bladder: D49.4

## 2018-05-01 NOTE — ED Triage Notes (Signed)
Pt arrived from [facility] via [facility] due to an unwitnessed fall. Reports hitting his head, no other pain at this time. Does have hematoma at top of head.

## 2018-05-01 NOTE — ED Notes (Signed)
Bed: WA17 Expected date:  Expected time:  Means of arrival:  Comments: EMS- 84 yo fall

## 2018-05-01 NOTE — ED Provider Notes (Signed)
[place] DEPT Provider Note: Zachary Spurling, MD, FACEP  CSN: [id number] MRN: [id number] ARRIVAL: 05/01/18 at 2234 ROOM: [place]   HISTORY OF PRESENT ILLNESS  05/01/18 10:51 PM Zachary Obrien is a 84 y.o. male who states he fell at his assisted living facility just prior to arrival.  He was putting on his pants and lost his balance.  He fell onto the floor striking his right frontal scalp. He has a hematoma there.  He denies loss of consciousness.  He has not been vomiting.  He denies neck pain or other injury.  Pain associated with the injury is minimal.  He is not on anticoagulation.   Past Medical History:  Diagnosis Date  . Bladder cancer ([place])   . Bladder neoplasm   . BPH (benign prostatic hyperplasia)   . Dermatophytosis of nail   . No pertinent past medical history   . Osteoarthritis     Past Surgical History:  Procedure Laterality Date  . bilatera lens replacement sfor cataracts  age 13  . CYSTOSCOPY  09/13/2012   Procedure: CYSTOSCOPY;  Surgeon: Molli Hazard, MD;  Location: [facility];  Service: Urology;  Laterality: N/A;  . CYSTOSCOPY WITH BIOPSY  10/25/2012   Procedure: CYSTOSCOPY WITH BIOPSY;  Surgeon: Molli Hazard, MD;  Location: [facility];  Service: Urology;  Laterality: N/A;  . detached retina surgery left eye  age 31  . EYE SURGERY  age 80   partial cornea tranplants  . hydrocele surgery  age 40  . JOINT REPLACEMENT  2010   right knee replacement  . TONSILLECTOMY  age 59  . TRANSURETHRAL RESECTION OF BLADDER TUMOR  09/13/2012   Procedure: TRANSURETHRAL RESECTION OF BLADDER TUMOR (TURBT);  Surgeon: Molli Hazard, MD;  Location: [facility];  Service: Urology;  Laterality: N/A;  fulguration of bladder tumor    No family history on file.  Social History   Tobacco Use  . Smoking status: Former Smoker    Packs/day: 2.00    Years: 20.00    Pack years: 40.00    Types: Cigarettes    Last attempt to quit:  11/08/1965    Years since quitting: 52.5  . Smokeless tobacco: Never Used  Substance Use Topics  . Alcohol use: Yes    Alcohol/week: 1.2 oz    Types: 2 Glasses of wine per week  . Drug use: No    Prior to Admission medications   Medication Sig Start Date End Date Taking? Authorizing Provider  aspirin 81 MG chewable tablet Chew 81 mg by mouth daily.   Yes [provider]  cholecalciferol (VITAMIN D) 1000 units tablet Take 1,000 Units by mouth daily.   Yes [provider]  docusate sodium (COLACE) 100 MG capsule Take 100 mg by mouth daily as needed for mild constipation.   Yes [provider]  ferrous sulfate 325 (65 FE) MG tablet Take 325 mg by mouth daily.    Yes [provider]  finasteride (PROSCAR) 5 MG tablet Take 5 mg by mouth daily.    Yes [provider]  ibuprofen (ADVIL,MOTRIN) 200 MG tablet Take 400 mg by mouth every 6 (six) hours as needed (pain).   Yes [provider]  vitamin B-12 (CYANOCOBALAMIN) 1000 MCG tablet Take by mouth.   Yes [provider]    Allergies Patient has no known allergies.   REVIEW OF SYSTEMS  Negative except as noted here or in the History of  Present Illness.   PHYSICAL EXAMINATION  Initial Vital Signs Blood pressure (!) 166/92, pulse 88, temperature 97.6 F (36.4 C), temperature source Oral, resp. rate 17, height 5\' 6"  (1.676 m), weight 80.1 kg (176 lb 8 oz), SpO2 97 %.  Examination General: Well-developed, well-nourished male in no acute distress; appearance consistent with age of record HENT: normocephalic; no hemotympanum; right frontal scalp hematoma with superficial abrasion:    Eyes: Bilateral pseudophakia with anisocoria, pupils sluggish; extraocular muscles intact; arcus senilis bilaterally Neck: supple Heart: regular rate and rhythm Lungs: clear to auscultation bilaterally Abdomen: soft; nondistended; nontender; bowel sounds present Extremities: Arthritic changes; no  tenderness on passive range of motion; +2 pitting edema of lower legs; pulses normal Neurologic: Awake, alert and oriented; motor function intact in all extremities and symmetric; no facial droop Skin: Warm and dry Psychiatric: Normal mood and affect   RESULTS  Summary of this visit's results, reviewed by myself:   EKG Interpretation  Date/Time:    Ventricular Rate:    PR Interval:    QRS Duration:   QT Interval:    QTC Calculation:   R Axis:     Text Interpretation:        Laboratory Studies: No results found for this or any previous visit (from the past 24 hour(s)). Imaging Studies: Ct Head Wo Contrast  Result Date: 05/01/2018 CLINICAL DATA:  Unwitnessed fall with head injury. EXAM: CT HEAD WITHOUT CONTRAST TECHNIQUE: Contiguous axial images were obtained from the base of the skull through the vertex without intravenous contrast. COMPARISON:  12/27/2004 head CT. FINDINGS: Brain: No evidence of parenchymal hemorrhage or extra-axial fluid collection. No mass lesion, mass effect, or midline shift. No CT evidence of acute infarction. Generalized cerebral volume loss. Nonspecific moderate subcortical and periventricular white matter hypodensity, most in keeping with chronic small vessel ischemic change. Cerebral ventricle sizes are concordant with the degree of cerebral volume loss. Vascular: No acute abnormality. Skull: No evidence of calvarial fracture. Small right lateral frontal scalp contusion. Sinuses/Orbits: The visualized paranasal sinuses are essentially clear. Other:  The mastoid air cells are unopacified. IMPRESSION: 1. Small lateral right frontal scalp contusion. 2. No evidence of acute intracranial abnormality. No evidence of calvarial fracture. 3. Generalized cerebral volume loss and moderate chronic small vessel ischemic changes in the cerebral white matter. Electronically Signed   By: Ilona Sorrel M.D.   On: 05/01/2018 23:47    ED COURSE and MDM  Nursing notes and initial  vitals signs, including pulse oximetry, reviewed.  Vitals:   05/01/18 2236 05/01/18 2238 05/01/18 2240  BP:   (!) 166/92  Pulse:   88  Resp:   17  Temp:   97.6 F (36.4 C)  TempSrc:   Oral  SpO2: 95%  97%  Weight:  80.1 kg (176 lb 8 oz)   Height:  5\' 6"  (1.676 m)    CT reassuring.  Patient awake, alert and oriented.  He appears to be safe for discharge back to his living facility.  PROCEDURES    ED DIAGNOSES     ICD-10-CM   1. Fall in home, initial encounter W19.XXXA    Y92.009   2. Scalp hematoma, initial encounter S00.03XA   3. Minor head injury, initial encounter S09.90XA        Zachary Chicoine, MD 05/02/18 0000

## 2018-05-02 NOTE — ED Notes (Signed)
Pt wound cleaned, refused dressing.

## 2018-06-02 DIAGNOSIS — H524 Presbyopia: Secondary | ICD-10-CM | POA: Diagnosis not present

## 2018-06-02 DIAGNOSIS — H52223 Regular astigmatism, bilateral: Secondary | ICD-10-CM | POA: Diagnosis not present

## 2018-06-02 DIAGNOSIS — H353230 Exudative age-related macular degeneration, bilateral, stage unspecified: Secondary | ICD-10-CM | POA: Diagnosis not present

## 2018-06-06 ENCOUNTER — Ambulatory Visit (INDEPENDENT_AMBULATORY_CARE_PROVIDER_SITE_OTHER): Payer: Medicare Other | Admitting: Podiatry

## 2018-06-06 ENCOUNTER — Encounter: Payer: Self-pay | Admitting: Podiatry

## 2018-06-06 DIAGNOSIS — M79676 Pain in unspecified toe(s): Secondary | ICD-10-CM

## 2018-06-06 DIAGNOSIS — B351 Tinea unguium: Secondary | ICD-10-CM

## 2018-06-06 NOTE — Progress Notes (Signed)
Patient ID: Zachary Obrien, male   DOB: April 24, 1921, 84 y.o.   MRN: [id number] Complaint:  Visit Type: Patient returns to my office for continued preventative foot care services. Complaint: Patient states" my nails have grown long and thick and become painful to walk and wear shoes" . The patient presents for preventative foot care services. No changes to ROS.  Patient is legally blind.  Podiatric Exam: Vascular: dorsalis pedis and posterior tibial pulses are palpable bilateral. Capillary return is immediate. Temperature gradient is WNL. Skin turgor WNL  Sensorium: Normal Semmes Weinstein monofilament test. Normal tactile sensation bilaterally. Nail Exam: Pt has thick disfigured discolored nails with subungual debris noted bilateral entire nail hallux through fifth toenails Ulcer Exam: There is no evidence of ulcer or pre-ulcerative changes or infection. Orthopedic Exam: Muscle tone and strength are WNL. No limitations in general ROM. No crepitus or effusions noted. Foot type and digits show no abnormalities. Bony prominences are unremarkable. Skin: No Porokeratosis. No infection or ulcers  Diagnosis:  Onychomycosis, , Pain in right toe, pain in left toes  Treatment & Plan Procedures and Treatment: Consent by patient was obtained for treatment procedures. The patient understood the discussion of treatment and procedures well. All questions were answered thoroughly reviewed. Debridement of mycotic and hypertrophic toenails, 1 through 5 bilateral and clearing of subungual debris. No ulceration, no infection noted.  Return Visit-Office Procedure: Patient instructed to return to the office for a follow up visit 10 weeks  for continued evaluation and treatment.   Gardiner Barefoot DPM

## 2018-07-05 DIAGNOSIS — H353233 Exudative age-related macular degeneration, bilateral, with inactive scar: Secondary | ICD-10-CM | POA: Diagnosis not present

## 2018-07-05 DIAGNOSIS — H43813 Vitreous degeneration, bilateral: Secondary | ICD-10-CM | POA: Diagnosis not present

## 2018-08-15 ENCOUNTER — Ambulatory Visit (INDEPENDENT_AMBULATORY_CARE_PROVIDER_SITE_OTHER): Payer: Medicare Other | Admitting: Podiatry

## 2018-08-15 ENCOUNTER — Encounter: Payer: Self-pay | Admitting: Podiatry

## 2018-08-15 DIAGNOSIS — B351 Tinea unguium: Secondary | ICD-10-CM | POA: Diagnosis not present

## 2018-08-15 DIAGNOSIS — M79676 Pain in unspecified toe(s): Secondary | ICD-10-CM

## 2018-08-15 NOTE — Progress Notes (Signed)
Patient ID: Zachary Obrien, male   DOB: [birth year], 84 y.o.   MRN: [id number] Complaint:  Visit Type: Patient returns to my office for continued preventative foot care services. Complaint: Patient states" my nails have grown long and thick and become painful to walk and wear shoes" . The patient presents for preventative foot care services. No changes to ROS.  Patient is legally blind.  Podiatric Exam: Vascular: dorsalis pedis and posterior tibial pulses are palpable bilateral. Capillary return is immediate. Temperature gradient is WNL. Skin turgor WNL  Sensorium: Normal Semmes Weinstein monofilament test. Normal tactile sensation bilaterally. Nail Exam: Pt has thick disfigured discolored nails with subungual debris noted bilateral entire nail hallux through fifth toenails Ulcer Exam: There is no evidence of ulcer or pre-ulcerative changes or infection. Orthopedic Exam: Muscle tone and strength are WNL. No limitations in general ROM. No crepitus or effusions noted. Foot type and digits show no abnormalities. Bony prominences are unremarkable. Skin: No Porokeratosis. No infection or ulcers  Diagnosis:  Onychomycosis, , Pain in right toe, pain in left toes  Treatment & Plan Procedures and Treatment: Consent by patient was obtained for treatment procedures. The patient understood the discussion of treatment and procedures well. All questions were answered thoroughly reviewed. Debridement of mycotic and hypertrophic toenails, 1 through 5 bilateral and clearing of subungual debris. No ulceration, no infection noted.  Return Visit-Office Procedure: Patient instructed to return to the office for a follow up visit 10 weeks  for continued evaluation and treatment.   Gardiner Barefoot DPM

## 2018-09-04 DIAGNOSIS — R350 Frequency of micturition: Secondary | ICD-10-CM | POA: Diagnosis not present

## 2018-09-04 DIAGNOSIS — C670 Malignant neoplasm of trigone of bladder: Secondary | ICD-10-CM | POA: Diagnosis not present

## 2018-10-23 DIAGNOSIS — C679 Malignant neoplasm of bladder, unspecified: Secondary | ICD-10-CM | POA: Diagnosis not present

## 2018-10-23 DIAGNOSIS — M179 Osteoarthritis of knee, unspecified: Secondary | ICD-10-CM | POA: Diagnosis not present

## 2018-10-23 DIAGNOSIS — R269 Unspecified abnormalities of gait and mobility: Secondary | ICD-10-CM | POA: Diagnosis not present

## 2018-10-23 DIAGNOSIS — N401 Enlarged prostate with lower urinary tract symptoms: Secondary | ICD-10-CM | POA: Diagnosis not present

## 2018-10-25 ENCOUNTER — Ambulatory Visit: Payer: Medicare Other | Admitting: Podiatry

## 2019-01-16 DIAGNOSIS — B351 Tinea unguium: Secondary | ICD-10-CM | POA: Diagnosis not present

## 2019-01-16 DIAGNOSIS — I739 Peripheral vascular disease, unspecified: Secondary | ICD-10-CM | POA: Diagnosis not present

## 2019-02-07 ENCOUNTER — Ambulatory Visit: Payer: Medicare Other | Admitting: Podiatry

## 2019-03-12 DIAGNOSIS — M6281 Muscle weakness (generalized): Secondary | ICD-10-CM | POA: Diagnosis not present

## 2019-03-12 DIAGNOSIS — R296 Repeated falls: Secondary | ICD-10-CM | POA: Diagnosis not present

## 2019-03-12 DIAGNOSIS — Z7409 Other reduced mobility: Secondary | ICD-10-CM | POA: Diagnosis not present

## 2019-03-12 DIAGNOSIS — R2689 Other abnormalities of gait and mobility: Secondary | ICD-10-CM | POA: Diagnosis not present

## 2019-03-14 DIAGNOSIS — Z7409 Other reduced mobility: Secondary | ICD-10-CM | POA: Diagnosis not present

## 2019-03-14 DIAGNOSIS — R2689 Other abnormalities of gait and mobility: Secondary | ICD-10-CM | POA: Diagnosis not present

## 2019-03-14 DIAGNOSIS — M6281 Muscle weakness (generalized): Secondary | ICD-10-CM | POA: Diagnosis not present

## 2019-03-14 DIAGNOSIS — R296 Repeated falls: Secondary | ICD-10-CM | POA: Diagnosis not present

## 2019-03-18 ENCOUNTER — Inpatient Hospital Stay (HOSPITAL_COMMUNITY)
Admission: EM | Admit: 2019-03-18 | Discharge: 2019-03-22 | DRG: 178 | Disposition: A | Discharge status: Skilled Nursing Facility | Payer: Medicare Other | Attending: Internal Medicine | Admitting: Internal Medicine

## 2019-03-18 ENCOUNTER — Emergency Department (HOSPITAL_COMMUNITY): Payer: Medicare Other

## 2019-03-18 ENCOUNTER — Encounter (HOSPITAL_COMMUNITY): Payer: Self-pay

## 2019-03-18 DIAGNOSIS — Z23 Encounter for immunization: Secondary | ICD-10-CM

## 2019-03-18 DIAGNOSIS — S8011XA Contusion of right lower leg, initial encounter: Secondary | ICD-10-CM | POA: Diagnosis present

## 2019-03-18 DIAGNOSIS — U071 COVID-19: Secondary | ICD-10-CM | POA: Diagnosis not present

## 2019-03-18 DIAGNOSIS — J189 Pneumonia, unspecified organism: Secondary | ICD-10-CM | POA: Diagnosis not present

## 2019-03-18 DIAGNOSIS — R079 Chest pain, unspecified: Secondary | ICD-10-CM | POA: Diagnosis not present

## 2019-03-18 DIAGNOSIS — Z66 Do not resuscitate: Secondary | ICD-10-CM | POA: Diagnosis present

## 2019-03-18 DIAGNOSIS — W19XXXA Unspecified fall, initial encounter: Secondary | ICD-10-CM | POA: Diagnosis not present

## 2019-03-18 DIAGNOSIS — Z7982 Long term (current) use of aspirin: Secondary | ICD-10-CM | POA: Diagnosis not present

## 2019-03-18 DIAGNOSIS — E860 Dehydration: Secondary | ICD-10-CM | POA: Diagnosis present

## 2019-03-18 DIAGNOSIS — N179 Acute kidney failure, unspecified: Secondary | ICD-10-CM | POA: Diagnosis not present

## 2019-03-18 DIAGNOSIS — S8992XA Unspecified injury of left lower leg, initial encounter: Secondary | ICD-10-CM | POA: Diagnosis not present

## 2019-03-18 DIAGNOSIS — R000 Tachycardia, unspecified: Secondary | ICD-10-CM | POA: Diagnosis not present

## 2019-03-18 DIAGNOSIS — S0003XA Contusion of scalp, initial encounter: Secondary | ICD-10-CM | POA: Diagnosis not present

## 2019-03-18 DIAGNOSIS — R55 Syncope and collapse: Secondary | ICD-10-CM | POA: Diagnosis not present

## 2019-03-18 DIAGNOSIS — N390 Urinary tract infection, site not specified: Secondary | ICD-10-CM

## 2019-03-18 DIAGNOSIS — S199XXA Unspecified injury of neck, initial encounter: Secondary | ICD-10-CM | POA: Diagnosis not present

## 2019-03-18 DIAGNOSIS — R52 Pain, unspecified: Secondary | ICD-10-CM | POA: Diagnosis not present

## 2019-03-18 DIAGNOSIS — M2550 Pain in unspecified joint: Secondary | ICD-10-CM | POA: Diagnosis not present

## 2019-03-18 DIAGNOSIS — S299XXA Unspecified injury of thorax, initial encounter: Secondary | ICD-10-CM | POA: Diagnosis not present

## 2019-03-18 DIAGNOSIS — W19XXXD Unspecified fall, subsequent encounter: Secondary | ICD-10-CM | POA: Diagnosis not present

## 2019-03-18 DIAGNOSIS — Z79899 Other long term (current) drug therapy: Secondary | ICD-10-CM | POA: Diagnosis not present

## 2019-03-18 DIAGNOSIS — R0902 Hypoxemia: Secondary | ICD-10-CM | POA: Diagnosis not present

## 2019-03-18 DIAGNOSIS — B9620 Unspecified Escherichia coli [E. coli] as the cause of diseases classified elsewhere: Secondary | ICD-10-CM | POA: Diagnosis not present

## 2019-03-18 DIAGNOSIS — M25562 Pain in left knee: Secondary | ICD-10-CM

## 2019-03-18 DIAGNOSIS — Z87891 Personal history of nicotine dependence: Secondary | ICD-10-CM | POA: Diagnosis not present

## 2019-03-18 DIAGNOSIS — M6281 Muscle weakness (generalized): Secondary | ICD-10-CM | POA: Diagnosis not present

## 2019-03-18 DIAGNOSIS — M79661 Pain in right lower leg: Secondary | ICD-10-CM | POA: Diagnosis not present

## 2019-03-18 DIAGNOSIS — S8991XA Unspecified injury of right lower leg, initial encounter: Secondary | ICD-10-CM | POA: Diagnosis not present

## 2019-03-18 DIAGNOSIS — Z791 Long term (current) use of non-steroidal anti-inflammatories (NSAID): Secondary | ICD-10-CM | POA: Diagnosis not present

## 2019-03-18 DIAGNOSIS — S0990XA Unspecified injury of head, initial encounter: Secondary | ICD-10-CM

## 2019-03-18 DIAGNOSIS — R2242 Localized swelling, mass and lump, left lower limb: Secondary | ICD-10-CM | POA: Diagnosis not present

## 2019-03-18 DIAGNOSIS — N400 Enlarged prostate without lower urinary tract symptoms: Secondary | ICD-10-CM | POA: Diagnosis present

## 2019-03-18 DIAGNOSIS — S81811A Laceration without foreign body, right lower leg, initial encounter: Secondary | ICD-10-CM | POA: Diagnosis present

## 2019-03-18 DIAGNOSIS — Z8551 Personal history of malignant neoplasm of bladder: Secondary | ICD-10-CM | POA: Diagnosis not present

## 2019-03-18 DIAGNOSIS — I4430 Unspecified atrioventricular block: Secondary | ICD-10-CM | POA: Diagnosis not present

## 2019-03-18 DIAGNOSIS — R319 Hematuria, unspecified: Secondary | ICD-10-CM | POA: Diagnosis not present

## 2019-03-18 DIAGNOSIS — I82409 Acute embolism and thrombosis of unspecified deep veins of unspecified lower extremity: Secondary | ICD-10-CM | POA: Diagnosis not present

## 2019-03-18 DIAGNOSIS — Z7401 Bed confinement status: Secondary | ICD-10-CM | POA: Diagnosis not present

## 2019-03-18 DIAGNOSIS — C679 Malignant neoplasm of bladder, unspecified: Secondary | ICD-10-CM | POA: Diagnosis not present

## 2019-03-18 DIAGNOSIS — R41841 Cognitive communication deficit: Secondary | ICD-10-CM | POA: Diagnosis not present

## 2019-03-18 DIAGNOSIS — R1312 Dysphagia, oropharyngeal phase: Secondary | ICD-10-CM | POA: Diagnosis not present

## 2019-03-18 DIAGNOSIS — R296 Repeated falls: Secondary | ICD-10-CM | POA: Diagnosis not present

## 2019-03-18 LAB — DIFFERENTIAL
Abs Immature Granulocytes: 0.01 10*3/uL (ref 0.00–0.07)
Basophils Absolute: 0 10*3/uL (ref 0.0–0.1)
Basophils Relative: 0 %
Eosinophils Absolute: 0 10*3/uL (ref 0.0–0.5)
Eosinophils Relative: 0 %
Immature Granulocytes: 0 %
Lymphocytes Relative: 10 %
Lymphs Abs: 0.7 10*3/uL (ref 0.7–4.0)
Monocytes Absolute: 0.7 10*3/uL (ref 0.1–1.0)
Monocytes Relative: 11 %
Neutro Abs: 5 10*3/uL (ref 1.7–7.7)
Neutrophils Relative %: 79 %

## 2019-03-18 LAB — URINALYSIS, ROUTINE W REFLEX MICROSCOPIC
Bilirubin Urine: NEGATIVE
Glucose, UA: NEGATIVE mg/dL
Ketones, ur: NEGATIVE mg/dL
Nitrite: NEGATIVE
Protein, ur: NEGATIVE mg/dL
Specific Gravity, Urine: 1.014 (ref 1.005–1.030)
WBC, UA: >50 WBC/hpf — ABNORMAL HIGH (ref 0–5)
pH: 5 (ref 5.0–8.0)

## 2019-03-18 LAB — BASIC METABOLIC PANEL
Anion gap: 10 (ref 5–15)
BUN: 35 mg/dL — ABNORMAL HIGH (ref 8–23)
CO2: 22 mmol/L (ref 22–32)
Calcium: 9.2 mg/dL (ref 8.9–10.3)
Chloride: 105 mmol/L (ref 98–111)
Creatinine, Ser: 1.46 mg/dL — ABNORMAL HIGH (ref 0.61–1.24)
GFR calc Af Amer: 47 mL/min — ABNORMAL LOW (ref >60)
GFR calc non Af Amer: 40 mL/min — ABNORMAL LOW (ref >60)
Glucose, Bld: 118 mg/dL — ABNORMAL HIGH (ref 70–99)
Potassium: 4.1 mmol/L (ref 3.5–5.1)
Sodium: 137 mmol/L (ref 135–145)

## 2019-03-18 LAB — CBC
HCT: 37 % — ABNORMAL LOW (ref 39.0–52.0)
Hemoglobin: 11.9 g/dL — ABNORMAL LOW (ref 13.0–17.0)
MCH: 31.8 pg (ref 26.0–34.0)
MCHC: 32.2 g/dL (ref 30.0–36.0)
MCV: 98.9 fL (ref 80.0–100.0)
Platelets: 151 10*3/uL (ref 150–400)
RBC: 3.74 MIL/uL — ABNORMAL LOW (ref 4.22–5.81)
RDW: 12.9 % (ref 11.5–15.5)
WBC: 5.3 10*3/uL (ref 4.0–10.5)
nRBC: 0 % (ref 0.0–0.2)

## 2019-03-18 LAB — FIBRINOGEN: Fibrinogen: 433 mg/dL (ref 210–475)

## 2019-03-18 LAB — TROPONIN I: Troponin I: 0.09 ng/mL (ref <0.03)

## 2019-03-18 LAB — TYPE AND SCREEN
ABO/RH(D): A POS
Antibody Screen: NEGATIVE

## 2019-03-18 LAB — LACTATE DEHYDROGENASE: LDH: 167 U/L (ref 98–192)

## 2019-03-18 LAB — LACTIC ACID, PLASMA: Lactic Acid, Venous: 1.2 mmol/L (ref 0.5–1.9)

## 2019-03-18 LAB — C-REACTIVE PROTEIN: CRP: 1.7 mg/dL — ABNORMAL HIGH (ref <1.0)

## 2019-03-18 LAB — ABO/RH: ABO/RH(D): A POS

## 2019-03-18 LAB — SARS CORONAVIRUS 2 BY RT PCR (HOSPITAL ORDER, PERFORMED IN ~~LOC~~ HOSPITAL LAB): SARS Coronavirus 2: POSITIVE — AB

## 2019-03-18 LAB — D-DIMER, QUANTITATIVE (NOT AT ARMC): D-Dimer, Quant: 19.73 ug/mL-FEU — ABNORMAL HIGH (ref 0.00–0.50)

## 2019-03-18 MED ORDER — DOCUSATE SODIUM 100 MG PO CAPS
100.0000 mg | ORAL_CAPSULE | Freq: Every day | ORAL | Status: DC | PRN
  Administered 2019-03-21: 100 mg via ORAL
  Filled 2019-03-18: qty 1

## 2019-03-18 MED ORDER — ZINC SULFATE 220 (50 ZN) MG PO CAPS
220.0000 mg | ORAL_CAPSULE | Freq: Every day | ORAL | Status: DC
  Administered 2019-03-18 – 2019-03-22 (×5): 220 mg via ORAL
  Filled 2019-03-18 (×5): qty 1

## 2019-03-18 MED ORDER — VITAMIN C 500 MG PO TABS
500.0000 mg | ORAL_TABLET | Freq: Two times a day (BID) | ORAL | Status: DC
  Administered 2019-03-18 – 2019-03-22 (×8): 500 mg via ORAL
  Filled 2019-03-18 (×8): qty 1

## 2019-03-18 MED ORDER — SODIUM CHLORIDE 0.9 % IV BOLUS
1000.0000 mL | Freq: Once | INTRAVENOUS | Status: AC
  Administered 2019-03-18: 1000 mL via INTRAVENOUS

## 2019-03-18 MED ORDER — ACETAMINOPHEN 325 MG PO TABS
650.0000 mg | ORAL_TABLET | Freq: Four times a day (QID) | ORAL | Status: DC | PRN
  Administered 2019-03-21: 650 mg via ORAL
  Filled 2019-03-18 (×2): qty 2

## 2019-03-18 MED ORDER — FERROUS SULFATE 325 (65 FE) MG PO TABS
325.0000 mg | ORAL_TABLET | Freq: Two times a day (BID) | ORAL | Status: DC
  Administered 2019-03-19 – 2019-03-22 (×7): 325 mg via ORAL
  Filled 2019-03-18 (×12): qty 1

## 2019-03-18 MED ORDER — VITAMIN D 25 MCG (1000 UNIT) PO TABS
1000.0000 [IU] | ORAL_TABLET | Freq: Every day | ORAL | Status: DC
  Administered 2019-03-19 – 2019-03-22 (×4): 1000 [IU] via ORAL
  Filled 2019-03-18 (×5): qty 1

## 2019-03-18 MED ORDER — SODIUM CHLORIDE 0.9 % IV SOLN
INTRAVENOUS | Status: AC
  Administered 2019-03-18: 23:00:00 via INTRAVENOUS

## 2019-03-18 MED ORDER — DILTIAZEM HCL 25 MG/5ML IV SOLN
10.0000 mg | INTRAVENOUS | Status: DC | PRN
  Filled 2019-03-18: qty 5

## 2019-03-18 MED ORDER — FINASTERIDE 5 MG PO TABS
5.0000 mg | ORAL_TABLET | Freq: Every day | ORAL | Status: DC
  Administered 2019-03-19 – 2019-03-22 (×4): 5 mg via ORAL
  Filled 2019-03-18 (×5): qty 1

## 2019-03-18 MED ORDER — SODIUM CHLORIDE 0.9 % IV SOLN
1.0000 g | Freq: Once | INTRAVENOUS | Status: AC
  Administered 2019-03-18: 1 g via INTRAVENOUS
  Filled 2019-03-18: qty 10

## 2019-03-18 MED ORDER — ASPIRIN 81 MG PO CHEW: 81.0000 mg | CHEWABLE_TABLET | Freq: Every day | ORAL | Status: DC

## 2019-03-18 MED ORDER — TETANUS-DIPHTH-ACELL PERTUSSIS 5-2.5-18.5 LF-MCG/0.5 IM SUSP
0.5000 mL | Freq: Once | INTRAMUSCULAR | Status: AC
Start: 2019-03-18 — End: 2019-03-18
  Administered 2019-03-18: 0.5 mL via INTRAMUSCULAR
  Filled 2019-03-18: qty 0.5

## 2019-03-18 MED ORDER — DILTIAZEM HCL 25 MG/5ML IV SOLN
10.0000 mg | Freq: Once | INTRAVENOUS | Status: AC
  Administered 2019-03-18: 10 mg via INTRAVENOUS
  Filled 2019-03-18: qty 5

## 2019-03-18 MED ORDER — SODIUM CHLORIDE 0.9 % IV SOLN
1.0000 g | INTRAVENOUS | Status: DC
  Administered 2019-03-19 – 2019-03-22 (×4): 1 g via INTRAVENOUS
  Filled 2019-03-18 (×4): qty 10

## 2019-03-18 MED ORDER — VITAMIN B-12 1000 MCG PO TABS
500.0000 ug | ORAL_TABLET | Freq: Every day | ORAL | Status: DC
  Administered 2019-03-19 – 2019-03-22 (×4): 500 ug via ORAL
  Filled 2019-03-18: qty 1
  Filled 2019-03-18 (×4): qty 0.5

## 2019-03-18 NOTE — ED Notes (Signed)
RN made contact with son, [name], and given update. RN to call son when disposition is made.

## 2019-03-18 NOTE — ED Notes (Signed)
[place], MD at bedside. °

## 2019-03-18 NOTE — ED Notes (Signed)
Pt Tachycardic in the 140's, PA Sam notified.

## 2019-03-18 NOTE — ED Notes (Signed)
Attempted report 

## 2019-03-18 NOTE — ED Notes (Signed)
Zachary Obrien, son, [phone]

## 2019-03-18 NOTE — ED Notes (Signed)
Son, [name], updated with disposition and given [facility] unit number.

## 2019-03-18 NOTE — ED Notes (Addendum)
ED TO INPATIENT HANDOFF REPORT  ED Nurse Name and Phone #: Jinny Blossom [phone]  S Name/Age/Gender Zachary Obrien 85 y.o. male Room/Bed: 037C/037C  Code Status   Code Status: Prior  Home/SNF/Other Nursing Home Patient oriented to: self, place, time and situation Is this baseline? Yes   Triage Complete: Triage complete  Chief Complaint Fall, hematoma  Triage Note Pt arrives via [facility], from [facility]. Pt had unwitnessed fall, does not remember event. Staff reports only down for a few seconds. Pt not on thinners. Pt in c-collar, pt denies neck/back pain. Pt has deformity to chest wall/poss clavicle deformity. Pt has hematoma to left forehead and right shin. Pt endorses pain to right shin.    Allergies No Known Allergies  Level of Care/Admitting Diagnosis ED Disposition    ED Disposition Condition [facility] Area: [place] [[id number]]  Level of Care: Telemetry Cardiac [103]  I expect the patient will be discharged within 24 hours: No (not a candidate for 5C-Observation unit)  Covid Evaluation: Confirmed COVID Positive  Isolation Risk Level: Low Risk/Droplet (Less than 4L [place] supplementation)  Diagnosis: COVID-19 virus infection [[id number]]  Admitting Physician: Shela Leff [[id number]]  Attending Physician: Shela Leff [[id number]]  PT Class (Do Not Modify): Observation [104]  PT Acc Code (Do Not Modify): Observation [10022]       B Medical/Surgery History Past Medical History:  Diagnosis Date  . Bladder cancer ([place])   . Bladder neoplasm   . BPH (benign prostatic hyperplasia)   . Dermatophytosis of nail   . No pertinent past medical history   . Osteoarthritis    Past Surgical History:  Procedure Laterality Date  . bilatera lens replacement sfor cataracts  age 24  . CYSTOSCOPY  09/13/2012   Procedure: CYSTOSCOPY;  Surgeon: Molli Hazard, MD;  Location: [facility];  Service: Urology;  Laterality: N/A;  . CYSTOSCOPY  WITH BIOPSY  10/25/2012   Procedure: CYSTOSCOPY WITH BIOPSY;  Surgeon: Molli Hazard, MD;  Location: [facility];  Service: Urology;  Laterality: N/A;  . detached retina surgery left eye  age 89  . EYE SURGERY  age 23   partial cornea tranplants  . hydrocele surgery  age 45  . JOINT REPLACEMENT  2010   right knee replacement  . TONSILLECTOMY  age 25  . TRANSURETHRAL RESECTION OF BLADDER TUMOR  09/13/2012   Procedure: TRANSURETHRAL RESECTION OF BLADDER TUMOR (TURBT);  Surgeon: Molli Hazard, MD;  Location: [facility];  Service: Urology;  Laterality: N/A;  fulguration of bladder tumor     A IV Location/Drains/Wounds Patient Lines/Drains/Airways Status   Active Line/Drains/Airways    Name:   Placement date:   Placement time:   Site:   Days:   Peripheral IV 03/18/19 Left Forearm   03/18/19    1706    Forearm   less than 1          Intake/Output Last 24 hours  Intake/Output Summary (Last 24 hours) at 03/18/2019 2109 Last data filed at 03/18/2019 2042 Gross per 24 hour  Intake 1000 ml  Output -  Net 1000 ml    Labs/Imaging Results for orders placed or performed during the hospital encounter of 03/18/19 (from the past 48 hour(s))  CBC     Status: Abnormal   Collection Time: 03/18/19  5:36 PM  Result Value Ref Range   WBC 5.3 4.0 - 10.5 K/uL   RBC 3.74 (L) 4.22 - 5.81 MIL/uL   Hemoglobin 11.9 (  L) 13.0 - 17.0 g/dL   HCT 37.0 (L) 39.0 - 52.0 %   MCV 98.9 80.0 - 100.0 fL   MCH 31.8 26.0 - 34.0 pg   MCHC 32.2 30.0 - 36.0 g/dL   RDW 12.9 11.5 - 15.5 %   Platelets 151 150 - 400 K/uL   nRBC 0.0 0.0 - 0.2 %    Comment: Performed at [facility], [place] [address], [place], [place] [address]  Basic metabolic panel     Status: Abnormal   Collection Time: 03/18/19  5:36 PM  Result Value Ref Range   Sodium 137 135 - 145 mmol/L   Potassium 4.1 3.5 - 5.1 mmol/L   Chloride 105 98 - 111 mmol/L   CO2 22 22 - 32 mmol/L   Glucose, Bld 118 (H) 70 - 99 mg/dL   BUN 35 (H) 8 - 23  mg/dL   Creatinine, Ser 1.46 (H) 0.61 - 1.24 mg/dL   Calcium 9.2 8.9 - 10.3 mg/dL   GFR calc non Af Amer 40 (L) >60 mL/min   GFR calc Af Amer 47 (L) >60 mL/min   Anion gap 10 5 - 15    Comment: Performed at [facility] [address] [place], [place] [address]  Type and screen [place]     Status: None   Collection Time: 03/18/19  5:36 PM  Result Value Ref Range   ABO/RH(D) A POS    Antibody Screen NEG    Sample Expiration      03/21/2019,2359 Performed at [facility], [place] [address] [place], [place] [address]   SARS Coronavirus 2 (CEPHEID - Performed in [facility] lab), Hosp Order     Status: Abnormal   Collection Time: 03/18/19  5:36 PM  Result Value Ref Range   SARS Coronavirus 2 POSITIVE (A) NEGATIVE    Comment: CRITICAL RESULT CALLED TO, READ BACK BY AND VERIFIED WITH: RN Yasenia Reedy, M [phone] [id number]  FCP (NOTE) If result is NEGATIVE SARS-CoV-2 target nucleic acids are NOT DETECTED. The SARS-CoV-2 RNA is generally detectable in upper and lower  respiratory specimens during the acute phase of infection. The lowest  concentration of SARS-CoV-2 viral copies this assay can detect is 250  copies / mL. A negative result does not preclude SARS-CoV-2 infection  and should not be used as the sole basis for treatment or other  patient management decisions.  A negative result may occur with  improper specimen collection / handling, submission of specimen other  than nasopharyngeal swab, presence of viral mutation(s) within the  areas targeted by this assay, and inadequate number of viral copies  (<250 copies / mL). A negative result must be combined with clinical  observations, patient history, and epidemiological information. If result is POSITIVE SARS-CoV-2 target nucleic acids are DETECTED.  The SARS-CoV-2 RNA is generally detectable in upper and lower  respiratory specimens during the acute phase of infection.  Positive  results are  indicative of active infection with SARS-CoV-2.  Clinical  correlation with patient history and other diagnostic information is  necessary to determine patient infection status.  Positive results do  not rule out bacterial infection or co-infection with other viruses. If result is PRESUMPTIVE POSTIVE SARS-CoV-2 nucleic acids MAY BE PRESENT.   A presumptive positive result was obtained on the submitted specimen  and confirmed on repeat testing.  While 2019 novel coronavirus  (SARS-CoV-2) nucleic acids may be present in the submitted sample  additional  confirmatory testing may be necessary for epidemiological  and / or clinical management purposes  to differentiate between  SARS-CoV-2 and other Sarbecovirus currently known to infect humans.  If clinically indicated additional testing with an alternate test  methodology [phone])  is advised. The SARS-CoV-2 RNA is generally  detectable in upper and lower respiratory specimens during the acute  phase of infection. The expected result is Negative. Fact Sheet for Patients:  StrictlyIdeas.no Fact Sheet for Healthcare Providers: BankingDealers.co.za This test is not yet approved or cleared by the [place] FDA and has been authorized for detection and/or diagnosis of SARS-CoV-2 by FDA under an Emergency Use Authorization (EUA).  This EUA will remain in effect (meaning this test can be used) for the duration of the COVID-19 declaration under Section 564(b)(1) of the Act, 21 U.S.C. section 360bbb-3(b)(1), unless the authorization is terminated or revoked sooner. Performed at [facility], [place] [address], [place], [place] [address]   Urinalysis, Routine w reflex microscopic     Status: Abnormal   Collection Time: 03/18/19  5:36 PM  Result Value Ref Range   Color, Urine YELLOW YELLOW   APPearance CLOUDY (A) CLEAR   Specific Gravity, Urine 1.014 1.005 - 1.030   pH 5.0 5.0 - 8.0    Glucose, UA NEGATIVE NEGATIVE mg/dL   Hgb urine dipstick SMALL (A) NEGATIVE   Bilirubin Urine NEGATIVE NEGATIVE   Ketones, ur NEGATIVE NEGATIVE mg/dL   Protein, ur NEGATIVE NEGATIVE mg/dL   Nitrite NEGATIVE NEGATIVE   Leukocytes,Ua LARGE (A) NEGATIVE   RBC / HPF 6-10 0 - 5 RBC/hpf   WBC, UA >50 (H) 0 - 5 WBC/hpf   Bacteria, UA FEW (A) NONE SEEN   Squamous Epithelial / LPF 0-5 0 - 5   Mucus PRESENT     Comment: Performed at [facility], [place] [address], [place], [place] [address]  ABO/Rh     Status: None   Collection Time: 03/18/19  5:36 PM  Result Value Ref Range   ABO/RH(D)      A POS Performed at [facility] [address] [place], [place] [address]    Dg Tibia/fibula Right  Result Date: 03/18/2019 CLINICAL DATA:  85 year old male status post unwitnessed fall. Pain. EXAM: RIGHT TIBIA AND FIBULA - 2 VIEW COMPARISON:  Right knee series 08/31/2004. FINDINGS: Interval total right knee arthroplasty. Hardware appears intact and normally aligned. No joint effusion is evident on the lateral view. Calcified peripheral vascular disease appears increased. Alignment at the right ankle is maintained. No acute osseous abnormality identified. IMPRESSION: 1. No acute fracture or dislocation identified about the right tib-fib. 2. Right knee hardware appears intact. 3. Calcified peripheral vascular disease. Electronically Signed   By: Genevie Ann M.D.   On: 03/18/2019 18:26   Ct Head Wo Contrast  Result Date: 03/18/2019 CLINICAL DATA:  Trauma EXAM: CT HEAD WITHOUT CONTRAST CT CERVICAL SPINE WITHOUT CONTRAST TECHNIQUE: Multidetector CT imaging of the head and cervical spine was performed following the standard protocol without intravenous contrast. Multiplanar CT image reconstructions of the cervical spine were also generated. COMPARISON:  05/01/2018 FINDINGS: CT HEAD FINDINGS Brain: No evidence of acute infarction, hemorrhage, hydrocephalus, extra-axial collection or mass lesion/mass effect.  Periventricular white matter hypodensity. Vascular: No hyperdense vessel or unexpected calcification. Skull: Normal. Negative for fracture or focal lesion. Sinuses/Orbits: No acute finding. Other: Soft tissue hematoma of the left frontoparietal scalp. CT CERVICAL SPINE FINDINGS Alignment: Normal. Skull base and vertebrae: No acute fracture. No  primary bone lesion or focal pathologic process. Soft tissues and spinal canal: No prevertebral fluid or swelling. No visible canal hematoma. Disc levels: Moderate multilevel disc and facet degenerative disease. Upper chest: Negative. Other: None. IMPRESSION: 1.  No acute intracranial pathology. 2.  Small-vessel white matter disease. 3.  Soft tissue hematoma of the left frontoparietal scalp. 4.  No fracture or static subluxation of the cervical spine. Electronically Signed   By: Eddie Candle M.D.   On: 03/18/2019 18:32   Ct Cervical Spine Wo Contrast  Result Date: 03/18/2019 CLINICAL DATA:  Trauma EXAM: CT HEAD WITHOUT CONTRAST CT CERVICAL SPINE WITHOUT CONTRAST TECHNIQUE: Multidetector CT imaging of the head and cervical spine was performed following the standard protocol without intravenous contrast. Multiplanar CT image reconstructions of the cervical spine were also generated. COMPARISON:  05/01/2018 FINDINGS: CT HEAD FINDINGS Brain: No evidence of acute infarction, hemorrhage, hydrocephalus, extra-axial collection or mass lesion/mass effect. Periventricular white matter hypodensity. Vascular: No hyperdense vessel or unexpected calcification. Skull: Normal. Negative for fracture or focal lesion. Sinuses/Orbits: No acute finding. Other: Soft tissue hematoma of the left frontoparietal scalp. CT CERVICAL SPINE FINDINGS Alignment: Normal. Skull base and vertebrae: No acute fracture. No primary bone lesion or focal pathologic process. Soft tissues and spinal canal: No prevertebral fluid or swelling. No visible canal hematoma. Disc levels: Moderate multilevel disc and facet  degenerative disease. Upper chest: Negative. Other: None. IMPRESSION: 1.  No acute intracranial pathology. 2.  Small-vessel white matter disease. 3.  Soft tissue hematoma of the left frontoparietal scalp. 4.  No fracture or static subluxation of the cervical spine. Electronically Signed   By: Eddie Candle M.D.   On: 03/18/2019 18:32   Dg Chest Port 1 View  Result Date: 03/18/2019 CLINICAL DATA:  85 year old male status post unwitnessed fall. Chest wall pain and possible clavicle deformity. EXAM: PORTABLE CHEST 1 VIEW COMPARISON:  Chest radiographs 11/08/2015 and earlier. FINDINGS: Portable AP semi upright view at 1745 hours. Stable cardiac size at the upper limits of normal to mildly enlarged. Mild tortuosity of the aorta. Other mediastinal contours are within normal limits. Visualized tracheal air column is within normal limits. Lower lung volumes. Allowing for portable technique the lungs are clear. No pneumothorax. No clavicle fracture is evident. And asymmetric widening of the left AC joint appears chronic and stable since 2012. No displaced rib fracture identified. No acute osseous abnormality identified. IMPRESSION: No acute cardiopulmonary abnormality or acute traumatic injury identified. Electronically Signed   By: Genevie Ann M.D.   On: 03/18/2019 18:25    Pending Labs Unresulted Labs (From admission, onward)    Start     Ordered   03/18/19 1817  Urine Culture  Once,   STAT     03/18/19 1817          Vitals/Pain Today's Vitals   03/18/19 1900 03/18/19 1915 03/18/19 2030 03/18/19 2100  BP: (!) 149/91 (!) 170/90 140/88 (!) 135/94  Pulse: (!) 107 (!) 111 (!) 140 (!) 111  Resp: (!) 24 20 20  (!) 24  Temp:      TempSrc:      SpO2: 100% 91% 97% 99%  PainSc:        Isolation Precautions Droplet and Contact precautions  Medications Medications  cefTRIAXone (ROCEPHIN) 1 g in sodium chloride 0.9 % 100 mL IVPB (0 g Intravenous Stopped 03/18/19 2041)  Tdap (BOOSTRIX) injection 0.5 mL (0.5  mLs Intramuscular Given 03/18/19 1856)  sodium chloride 0.9 % bolus 1,000 mL (0 mLs Intravenous  Stopped 03/18/19 2042)  sodium chloride 0.9 % bolus 1,000 mL (1,000 mLs Intravenous New Bag/Given 03/18/19 2027)  diltiazem (CARDIZEM) injection 10 mg (10 mg Intravenous Given 03/18/19 2049)    Mobility walks with device     Focused Assessments Pulmonary Assessment Handoff:  Lung sounds:   O2 Device: Room Air   Covid positive     R Recommendations: See Admitting Provider Note  Report given to:   Additional Notes: Pt alert and oriented, from [facility], pt has UTI and is covid positive.Son, [name], would like

## 2019-03-18 NOTE — H&P (Addendum)
History and Physical    Zachary Obrien EVO:[id number] DOB: [birth year] DOA: 03/18/2019  PCP: Josetta Huddle, MD Patient coming from: [facility]  Chief Complaint: Fall  HPI: Zachary Obrien is a 85 y.o. male with medical history significant of bladder cancer, BPH, and conditions listed below presenting to the hospital via EMS from his nursing home for evaluation after an unwitnessed fall.  Staff reported to EMS that patient was only down for a few seconds.  He is not on blood thinners.  He was noted to have a laceration to his right lower leg and a hematoma of his head.  EMS reported deformity to the chest wall but patient stated this was from a fall several years ago and he has no chest injury or complaints.  States he was standing up and then fell.  Does not know what exactly happened.  Denies any dizziness/lightheadedness, chest pain, or shortness of breath at that time.  He is not sure if he lost consciousness.  Denies any fevers, chills, cough, nausea, vomiting, abdominal pain, diarrhea, dysuria, or urinary frequency/urgency.  No other complaints.  Complaining of pain in his right shin region.  ED Course: Vital signs stable on arrival.  No leukocytosis.  Hemoglobin 11.9, at baseline.  BUN 35.  Creatinine 1.4, baseline 1.0.  COVID-19 rapid test positive.  UA with large amount of leukocytes, greater than 50 WBCs, and few bacteria.  Urine culture pending.  Chest x-ray showing no acute cardiopulmonary abnormality or acute traumatic injury.  X-ray of the right tibia/fibula showing no acute fracture or dislocation and intact right knee hardware.  Head CT showing soft tissue hematoma of the left frontal parietal scalp and no acute intracranial pathology.  CT C-spine showing no fracture or static subluxation. Patient received ceftriaxone and 1 L IV fluid bolus.  Later became tachycardic with heart rate in the 130s to 140s, regular rhythm but no visible P waves.  He was given another 1 L fluid bolus and  will be given Cardizem 10 mg IV.  Review of Systems: As per HPI otherwise 10 point review of systems negative.  Past Medical History:  Diagnosis Date   Bladder cancer [facility])    Bladder neoplasm    BPH (benign prostatic hyperplasia)    Dermatophytosis of nail    No pertinent past medical history    Osteoarthritis     Past Surgical History:  Procedure Laterality Date   bilatera lens replacement sfor cataracts  age 64   CYSTOSCOPY  09/13/2012   Procedure: CYSTOSCOPY;  Surgeon: Molli Hazard, MD;  Location: [facility];  Service: Urology;  Laterality: N/A;   CYSTOSCOPY WITH BIOPSY  10/25/2012   Procedure: CYSTOSCOPY WITH BIOPSY;  Surgeon: Molli Hazard, MD;  Location: [facility];  Service: Urology;  Laterality: N/A;   detached retina surgery left eye  age 89   EYE SURGERY  age 29   partial cornea tranplants   hydrocele surgery  age 76   JOINT REPLACEMENT  2010   right knee replacement   TONSILLECTOMY  age 77   TRANSURETHRAL RESECTION OF BLADDER TUMOR  09/13/2012   Procedure: TRANSURETHRAL RESECTION OF BLADDER TUMOR (TURBT);  Surgeon: Molli Hazard, MD;  Location: [facility];  Service: Urology;  Laterality: N/A;  fulguration of bladder tumor     reports that he quit smoking about 53 years ago. His smoking use included cigarettes. He has a 40.00 pack-year smoking history. He has never used smokeless tobacco. He reports current alcohol use  of about 2.0 standard drinks of alcohol per week. He reports that he does not use drugs.  No Known Allergies  History reviewed. No pertinent family history.  Prior to Admission medications   Medication Sig Start Date End Date Taking? Authorizing Provider  aspirin 81 MG chewable tablet Chew 81 mg by mouth daily.    [provider]  cholecalciferol (VITAMIN D) 1000 units tablet Take 1,000 Units by mouth daily.    [provider]  docusate sodium (COLACE) 100 MG capsule Take 100 mg by mouth daily as needed  for mild constipation.    [provider]  ferrous sulfate 325 (65 FE) MG tablet Take 325 mg by mouth daily.     [provider]  finasteride (PROSCAR) 5 MG tablet Take 5 mg by mouth daily.     [provider]  ibuprofen (ADVIL,MOTRIN) 200 MG tablet Take 400 mg by mouth every 6 (six) hours as needed (pain).    [provider]  vitamin B-12 (CYANOCOBALAMIN) 1000 MCG tablet Take by mouth.    [provider]    Physical Exam: Vitals:   03/18/19 2030 03/18/19 2100 03/18/19 2115 03/18/19 2145  BP: 140/88 (!) 135/94 133/79 137/81  Pulse: (!) 140 (!) 111 (!) 110 (!) 112  Resp: 20 (!) 24 20 (!) 22  Temp:      TempSrc:      SpO2: 97% 99% 100% 95%    Physical Exam  Constitutional: He is oriented to person, place, and time. No distress.  Frail elderly male  HENT:  Head: Normocephalic.  Left parietal region scalp hematoma  Eyes: Right eye exhibits no discharge. Left eye exhibits no discharge.  Neck: Neck supple.  Cardiovascular: Regular rhythm and intact distal pulses.  Tachycardic  Pulmonary/Chest: Effort normal and breath sounds normal. No respiratory distress. He has no wheezes. He has no rales.  Anterior lung fields clear to auscultation  Abdominal: Soft. Bowel sounds are normal. He exhibits no distension. There is no abdominal tenderness. There is no guarding.  Musculoskeletal:        General: No edema.  Neurological: He is alert and oriented to person, place, and time.  Speech fluent Moving all extremities spontaneously  Skin: Skin is warm and dry. He is not diaphoretic.  Right shin laceration and large hematoma.  No active bleeding from the laceration.     Labs on Admission: I have personally reviewed following labs and imaging studies  CBC: Recent Labs  Lab 03/18/19 1736  WBC 5.3  HGB 11.9*  HCT 37.0*  MCV 98.9  PLT 660   Basic Metabolic Panel: Recent Labs  Lab 03/18/19 1736  NA 137  K 4.1  CL 105  CO2 22  GLUCOSE  118*  BUN 35*  CREATININE 1.46*  CALCIUM 9.2   GFR: CrCl cannot be calculated (Unknown ideal weight.). Liver Function Tests: No results for input(s): AST, ALT, ALKPHOS, BILITOT, PROT, ALBUMIN in the last 168 hours. No results for input(s): LIPASE, AMYLASE in the last 168 hours. No results for input(s): AMMONIA in the last 168 hours. Coagulation Profile: No results for input(s): INR, PROTIME in the last 168 hours. Cardiac Enzymes: No results for input(s): CKTOTAL, CKMB, CKMBINDEX, TROPONINI in the last 168 hours. BNP (last 3 results) No results for input(s): PROBNP in the last 8760 hours. HbA1C: No results for input(s): HGBA1C in the last 72 hours. CBG: No results for input(s): GLUCAP in the last 168 hours. Lipid Profile: No results for input(s): CHOL,  HDL, LDLCALC, TRIG, CHOLHDL, LDLDIRECT in the last 72 hours. Thyroid Function Tests: No results for input(s): TSH, T4TOTAL, FREET4, T3FREE, THYROIDAB in the last 72 hours. Anemia Panel: No results for input(s): VITAMINB12, FOLATE, FERRITIN, TIBC, IRON, RETICCTPCT in the last 72 hours. Urine analysis:    Component Value Date/Time   COLORURINE YELLOW 03/18/2019 1736   APPEARANCEUR CLOUDY (A) 03/18/2019 1736   LABSPEC 1.014 03/18/2019 1736   PHURINE 5.0 03/18/2019 1736   GLUCOSEU NEGATIVE 03/18/2019 1736   HGBUR SMALL (A) 03/18/2019 1736   BILIRUBINUR NEGATIVE 03/18/2019 1736   KETONESUR NEGATIVE 03/18/2019 1736   PROTEINUR NEGATIVE 03/18/2019 1736   UROBILINOGEN 0.2 01/10/2009 1100   NITRITE NEGATIVE 03/18/2019 1736   LEUKOCYTESUR LARGE (A) 03/18/2019 1736    Radiological Exams on Admission: Dg Tibia/fibula Right  Result Date: 03/18/2019 CLINICAL DATA:  85 year old male status post unwitnessed fall. Pain. EXAM: RIGHT TIBIA AND FIBULA - 2 VIEW COMPARISON:  Right knee series 08/31/2004. FINDINGS: Interval total right knee arthroplasty. Hardware appears intact and normally aligned. No joint effusion is evident on the lateral  view. Calcified peripheral vascular disease appears increased. Alignment at the right ankle is maintained. No acute osseous abnormality identified. IMPRESSION: 1. No acute fracture or dislocation identified about the right tib-fib. 2. Right knee hardware appears intact. 3. Calcified peripheral vascular disease. Electronically Signed   By: Genevie Ann M.D.   On: 03/18/2019 18:26   Ct Head Wo Contrast  Result Date: 03/18/2019 CLINICAL DATA:  Trauma EXAM: CT HEAD WITHOUT CONTRAST CT CERVICAL SPINE WITHOUT CONTRAST TECHNIQUE: Multidetector CT imaging of the head and cervical spine was performed following the standard protocol without intravenous contrast. Multiplanar CT image reconstructions of the cervical spine were also generated. COMPARISON:  05/01/2018 FINDINGS: CT HEAD FINDINGS Brain: No evidence of acute infarction, hemorrhage, hydrocephalus, extra-axial collection or mass lesion/mass effect. Periventricular white matter hypodensity. Vascular: No hyperdense vessel or unexpected calcification. Skull: Normal. Negative for fracture or focal lesion. Sinuses/Orbits: No acute finding. Other: Soft tissue hematoma of the left frontoparietal scalp. CT CERVICAL SPINE FINDINGS Alignment: Normal. Skull base and vertebrae: No acute fracture. No primary bone lesion or focal pathologic process. Soft tissues and spinal canal: No prevertebral fluid or swelling. No visible canal hematoma. Disc levels: Moderate multilevel disc and facet degenerative disease. Upper chest: Negative. Other: None. IMPRESSION: 1.  No acute intracranial pathology. 2.  Small-vessel white matter disease. 3.  Soft tissue hematoma of the left frontoparietal scalp. 4.  No fracture or static subluxation of the cervical spine. Electronically Signed   By: Eddie Candle M.D.   On: 03/18/2019 18:32   Ct Cervical Spine Wo Contrast  Result Date: 03/18/2019 CLINICAL DATA:  Trauma EXAM: CT HEAD WITHOUT CONTRAST CT CERVICAL SPINE WITHOUT CONTRAST TECHNIQUE:  Multidetector CT imaging of the head and cervical spine was performed following the standard protocol without intravenous contrast. Multiplanar CT image reconstructions of the cervical spine were also generated. COMPARISON:  05/01/2018 FINDINGS: CT HEAD FINDINGS Brain: No evidence of acute infarction, hemorrhage, hydrocephalus, extra-axial collection or mass lesion/mass effect. Periventricular white matter hypodensity. Vascular: No hyperdense vessel or unexpected calcification. Skull: Normal. Negative for fracture or focal lesion. Sinuses/Orbits: No acute finding. Other: Soft tissue hematoma of the left frontoparietal scalp. CT CERVICAL SPINE FINDINGS Alignment: Normal. Skull base and vertebrae: No acute fracture. No primary bone lesion or focal pathologic process. Soft tissues and spinal canal: No prevertebral fluid or swelling. No visible canal hematoma. Disc levels: Moderate multilevel disc and facet degenerative disease. Upper  chest: Negative. Other: None. IMPRESSION: 1.  No acute intracranial pathology. 2.  Small-vessel white matter disease. 3.  Soft tissue hematoma of the left frontoparietal scalp. 4.  No fracture or static subluxation of the cervical spine. Electronically Signed   By: Eddie Candle M.D.   On: 03/18/2019 18:32   Dg Chest Port 1 View  Result Date: 03/18/2019 CLINICAL DATA:  85 year old male status post unwitnessed fall. Chest wall pain and possible clavicle deformity. EXAM: PORTABLE CHEST 1 VIEW COMPARISON:  Chest radiographs 11/08/2015 and earlier. FINDINGS: Portable AP semi upright view at 1745 hours. Stable cardiac size at the upper limits of normal to mildly enlarged. Mild tortuosity of the aorta. Other mediastinal contours are within normal limits. Visualized tracheal air column is within normal limits. Lower lung volumes. Allowing for portable technique the lungs are clear. No pneumothorax. No clavicle fracture is evident. And asymmetric widening of the left AC joint appears chronic  and stable since 2012. No displaced rib fracture identified. No acute osseous abnormality identified. IMPRESSION: No acute cardiopulmonary abnormality or acute traumatic injury identified. Electronically Signed   By: Genevie Ann M.D.   On: 03/18/2019 18:25    EKG: Independently reviewed.  Initial EKG with normal sinus rhythm. Repeat EKG showing tachycardia (heart rate 126), regular rhythm, no visible P waves.  Assessment/Plan Principal Problem:   COVID-19 virus infection Active Problems:   UTI (urinary tract infection)   Unwitnessed fall   Sinus tachycardia   AKI (acute kidney injury) ([place])   COVID-19 virus infection Patient has no signs or symptoms of respiratory distress.  Breathing comfortably on room air.  Not hypoxic.  Afebrile. Not complaining of dyspnea or cough.  COVID-19 rapid test positive.  Chest x-ray showing no acute infiltrates. -Continuous pulse ox, monitor closely.  Supplemental oxygen if needed. -Check CRP, d-dimer, ferritin, fibrinogen, LDH, procalcitonin, troponin levels -Add differential to CBC -Vitamin C 500 mg twice daily -Zinc 220 mg daily -Droplet and contact precautions  Addendum:  -Ferritin 1052.  CRP 1.7.   -D-dimer 19.73.  Although patient is not hypoxic, there is concern for PE given tachycardia.  Stat CT angiogram chest has been ordered.  Lower extremity Dopplers to rule out DVT. -Troponin 0.09.  Patient has no complaints of chest pain and appears comfortable. Will continue to trend.  Cardiac monitoring.  UTI Afebrile and no leukocytosis.  Tachycardic.  Does not meet SIRS criteria.  UA with large amount of leukocytes, greater than 50 WBCs, and few bacteria.  -Ceftriaxone -Urine culture -Check lactic acid level  Unwitnessed fall, possible syncope Patient had an unwitnessed fall at his nursing home.  Not clear if he lost consciousness.  Denies any dizziness/lightheadedness, chest pain, or shortness of breath at the time of the fall.  Tachycardic but  currently in sinus rhythm, suspect secondary to underlying infection/dehydration..  Chest x-ray showing no acute cardiopulmonary abnormality or acute traumatic injury.  X-ray of the right tibia/fibula showing no acute fracture or dislocation and intact right knee hardware.  Head CT showing soft tissue hematoma of the left frontal parietal scalp and no acute intracranial pathology.  CT C-spine showing no fracture or static subluxation. -Cardiac monitoring -IV fluid hydration -Check orthostatics -Echocardiogram -Tylenol PRN right shin pain (has a large hematoma in this region)  Sinus tachycardia On arrival to the ED, heart rate 98.  Patient later became tachycardic with heart rate in the 130s to 140, regular rhythm but no visible P waves.  Received IV fluid bolus and IV Cardizem 10  mg.  Monitor currently showing sinus rhythm with heart rate in the 110s.  Tachycardia likely secondary to underlying infection and dehydration. -Cardiac monitoring -IV fluid hydration -IV Cardizem PRN HR >120 -Check TSH, free T4 levels -Monitor closely  AKI BUN 35.  Creatinine 1.4, baseline 1.0.  Likely prerenal due to dehydration. -IV fluid hydration -Continue to monitor BMP -Monitor urine output  BPH -Continue finasteride  DVT prophylaxis: SCDs at this time given scalp hematoma and large hematoma of right lower extremity Code Status: Patient wishes to be DNR. Family Communication: No family available. Disposition Plan: Anticipate discharge after clinical improvement. Consults called: None Admission status: Observation, telemetry  This chart was dictated using voice recognition software.  Despite best efforts to proofread, errors can occur which can change the documentation meaning.  Shela Leff MD [facility] Pager [phone]  If 7PM-7AM, please contact night-coverage [email or web address] Password [email or web address]  03/18/2019, 10:20 PM

## 2019-03-18 NOTE — ED Provider Notes (Signed)
1900: Assumed care of patient from Dr. Jeanell Sparrow at change of shift pending fluid completion & re-assessment w/ ambulation.   Please see prior provider note for full H&P.  Patient is a 85 year old male with a history of bladder cancer status post tumor resection in 2013, BPH, and osteoarthritis who presents to the emergency department at [facility] assisted living facility status post unwitnessed fall.  Injuries as documented by prior provider note, wound care by nursing staff.   Work-up in the emergency department notable for UTI with AKI. Vision without significant traumatic injury.  No fracture/dislocation.  No head bleed.   19:47: Nursing staff informed me patient HR in the 130s- assessed at bedside, he is resting comfortably, denies chest pain, dyspnea, or palpitations. States he is cold. Rectal temp on arrival normal, rechecked orally- remains normal. Monitor appears to be sinus tach.  EKG. Will give additional fluids.   EKG Interpretation  Date/Time:  Sunday Mar 18 2019 19:47:22 EDT Ventricular Rate:  126 PR Interval:    QRS Duration: 97 QT Interval:  333 QTC Calculation: 483 R Axis:   1 Text Interpretation:  Sinus tachycardia Multiform ventricular premature complexes Borderline prolonged QT interval Confirmed by Gerlene Fee [phone]) on 03/18/2019 8:41:09 PM  Persistent tachycardia, discussed w/ Dr. Sedonia Small who has evaluated patient @ bedside, in agreement w/ prior fluids, given lack of improvement, pllan for 10 mg of diltiazem w/ re-assessment & discussion w/ admitting team.   HR improved to 105-112.   20:51: CONSULT: Discussed w/ hospitalist Dr. Marlowe Sax- accepts admission.     Leafy Kindle 03/18/19 2130    Maudie Flakes, MD 03/21/19 1615

## 2019-03-18 NOTE — ED Notes (Signed)
XR at bedside

## 2019-03-18 NOTE — ED Provider Notes (Addendum)
[name] EMERGENCY DEPARTMENT Provider Note   CSN: [id number] Arrival date & time: 03/18/19  1703    History   Chief Complaint Chief Complaint  Patient presents with   Fall    HPI Zachary Obrien is a 85 y.o. male.     HPI  85 year old male presents with hearing screen after fall today.  Patient complaining of laceration pain to right lower leg.  He also has a hematoma of his head.  EMS reported a deformity to the chest wall, but patient states this was from a fall several years ago and he has no chest injury or complaints.  He is not sure what caused the fall but does not think that he was using his walker as he usually does.  Is not clear whether or not he had a loss of consciousness.  He states that he takes aspirin but no other blood thinners.  He denies any headache, head injury, neck pain, arm weakness, chest pain, back pain, abdominal pain, nausea, vomiting, or diarrhea.  Past Medical History:  Diagnosis Date   Bladder cancer [facility])    Bladder neoplasm    BPH (benign prostatic hyperplasia)    Dermatophytosis of nail    No pertinent past medical history    Osteoarthritis     Patient Active Problem List   Diagnosis Date Noted   CAP (community acquired pneumonia) 11/08/2015   Pneumonia 11/08/2015   Dermatophytosis of nail    Bladder cancer ([place]) 10/25/2012    Past Surgical History:  Procedure Laterality Date   bilatera lens replacement sfor cataracts  age 44   CYSTOSCOPY  09/13/2012   Procedure: CYSTOSCOPY;  Surgeon: Molli Hazard, MD;  Location: [facility];  Service: Urology;  Laterality: N/A;   CYSTOSCOPY WITH BIOPSY  10/25/2012   Procedure: CYSTOSCOPY WITH BIOPSY;  Surgeon: Molli Hazard, MD;  Location: [facility];  Service: Urology;  Laterality: N/A;   detached retina surgery left eye  age 48   EYE SURGERY  age 42   partial cornea tranplants   hydrocele surgery  age 53   JOINT REPLACEMENT  2010   right knee  replacement   TONSILLECTOMY  age 89   TRANSURETHRAL RESECTION OF BLADDER TUMOR  09/13/2012   Procedure: TRANSURETHRAL RESECTION OF BLADDER TUMOR (TURBT);  Surgeon: Molli Hazard, MD;  Location: [facility];  Service: Urology;  Laterality: N/A;  fulguration of bladder tumor        Home Medications    Prior to Admission medications   Medication Sig Start Date End Date Taking? Authorizing Provider  aspirin 81 MG chewable tablet Chew 81 mg by mouth daily.    [provider]  cholecalciferol (VITAMIN D) 1000 units tablet Take 1,000 Units by mouth daily.    [provider]  docusate sodium (COLACE) 100 MG capsule Take 100 mg by mouth daily as needed for mild constipation.    [provider]  ferrous sulfate 325 (65 FE) MG tablet Take 325 mg by mouth daily.     [provider]  finasteride (PROSCAR) 5 MG tablet Take 5 mg by mouth daily.     [provider]  ibuprofen (ADVIL,MOTRIN) 200 MG tablet Take 400 mg by mouth every 6 (six) hours as needed (pain).    [provider]  vitamin B-12 (CYANOCOBALAMIN) 1000 MCG tablet Take by mouth.    [provider]    Family History History reviewed. No pertinent family history.  Social History Social  History   Tobacco Use   Smoking status: Former Smoker    Packs/day: 2.00    Years: 20.00    Pack years: 40.00    Types: Cigarettes    Last attempt to quit: 11/08/1965    Years since quitting: 53.3   Smokeless tobacco: Never Used  Substance Use Topics   Alcohol use: Yes    Alcohol/week: 2.0 standard drinks    Types: 2 Glasses of wine per week   Drug use: No     Allergies   Patient has no known allergies.   Review of Systems Review of Systems  All other systems reviewed and are negative.    Physical Exam Updated Vital Signs BP 136/87    Pulse 98    Temp 98.1 F (36.7 C) (Oral)    Resp 20    SpO2 98%   Physical Exam Vitals signs and nursing note reviewed.    Constitutional:      General: He is not in acute distress.    Appearance: Normal appearance. He is normal weight. He is ill-appearing.  HENT:     Head:      Comments: Left scalp hematoma with no overlying laceration    Right Ear: Tympanic membrane and external ear normal.     Left Ear: Tympanic membrane and external ear normal.     Nose: Nose normal.     Mouth/Throat:     Mouth: Mucous membranes are moist.  Eyes:     Extraocular Movements: Extraocular movements intact.  Neck:     Musculoskeletal: Normal range of motion and neck supple.     Comments: Cervical collar in place Anterior neck without signs of trauma trachea is midline Cardiovascular:     Rate and Rhythm: Normal rate.     Pulses: Normal pulses.  Pulmonary:     Effort: Pulmonary effort is normal.     Comments: Deformity left upper chest appears to be left sternoclavicular joint No tenderness No signs of trauma on chest wall No crepitus Good bilateral breath sounds Abdominal:     General: Abdomen is flat. Bowel sounds are normal.     Palpations: Abdomen is soft.  Musculoskeletal: Normal range of motion.     Comments: 3 cm laceration right lower extremity with overlying hematoma and some tenderness Full active range of motion of knee ankle and hip bilaterally No other signs of trauma noted on extremities  Skin:    General: Skin is warm and dry.     Capillary Refill: Capillary refill takes less than 2 seconds.  Neurological:     General: No focal deficit present.     Mental Status: He is alert and oriented to person, place, and time. Mental status is at baseline.     Cranial Nerves: No cranial nerve deficit.     Motor: No weakness.     Gait: Gait normal.  Psychiatric:        Mood and Affect: Mood normal.      ED Treatments / Results  Labs (all labs ordered are listed, but only abnormal results are displayed) Labs Reviewed - No data to display  EKG EKG Interpretation  Date/Time:  Sunday Mar 18 2019  17:06:41 EDT Ventricular Rate:  98 PR Interval:    QRS Duration: 99 QT Interval:  351 QTC Calculation: 449 R Axis:   -4 Text Interpretation:  Normal sinus rhythm No significant change since last tracing Confirmed by Pattricia Boss [phone]) on 03/18/2019 6:37:31 PM   Radiology Dg Kym Groom  Right  Result Date: 03/18/2019 CLINICAL DATA:  85 year old male status post unwitnessed fall. Pain. EXAM: RIGHT TIBIA AND FIBULA - 2 VIEW COMPARISON:  Right knee series 08/31/2004. FINDINGS: Interval total right knee arthroplasty. Hardware appears intact and normally aligned. No joint effusion is evident on the lateral view. Calcified peripheral vascular disease appears increased. Alignment at the right ankle is maintained. No acute osseous abnormality identified. IMPRESSION: 1. No acute fracture or dislocation identified about the right tib-fib. 2. Right knee hardware appears intact. 3. Calcified peripheral vascular disease. Electronically Signed   By: Genevie Ann M.D.   On: 03/18/2019 18:26   Ct Head Wo Contrast  Result Date: 03/18/2019 CLINICAL DATA:  Trauma EXAM: CT HEAD WITHOUT CONTRAST CT CERVICAL SPINE WITHOUT CONTRAST TECHNIQUE: Multidetector CT imaging of the head and cervical spine was performed following the standard protocol without intravenous contrast. Multiplanar CT image reconstructions of the cervical spine were also generated. COMPARISON:  05/01/2018 FINDINGS: CT HEAD FINDINGS Brain: No evidence of acute infarction, hemorrhage, hydrocephalus, extra-axial collection or mass lesion/mass effect. Periventricular white matter hypodensity. Vascular: No hyperdense vessel or unexpected calcification. Skull: Normal. Negative for fracture or focal lesion. Sinuses/Orbits: No acute finding. Other: Soft tissue hematoma of the left frontoparietal scalp. CT CERVICAL SPINE FINDINGS Alignment: Normal. Skull base and vertebrae: No acute fracture. No primary bone lesion or focal pathologic process. Soft tissues and  spinal canal: No prevertebral fluid or swelling. No visible canal hematoma. Disc levels: Moderate multilevel disc and facet degenerative disease. Upper chest: Negative. Other: None. IMPRESSION: 1.  No acute intracranial pathology. 2.  Small-vessel white matter disease. 3.  Soft tissue hematoma of the left frontoparietal scalp. 4.  No fracture or static subluxation of the cervical spine. Electronically Signed   By: Eddie Candle M.D.   On: 03/18/2019 18:32   Ct Cervical Spine Wo Contrast  Result Date: 03/18/2019 CLINICAL DATA:  Trauma EXAM: CT HEAD WITHOUT CONTRAST CT CERVICAL SPINE WITHOUT CONTRAST TECHNIQUE: Multidetector CT imaging of the head and cervical spine was performed following the standard protocol without intravenous contrast. Multiplanar CT image reconstructions of the cervical spine were also generated. COMPARISON:  05/01/2018 FINDINGS: CT HEAD FINDINGS Brain: No evidence of acute infarction, hemorrhage, hydrocephalus, extra-axial collection or mass lesion/mass effect. Periventricular white matter hypodensity. Vascular: No hyperdense vessel or unexpected calcification. Skull: Normal. Negative for fracture or focal lesion. Sinuses/Orbits: No acute finding. Other: Soft tissue hematoma of the left frontoparietal scalp. CT CERVICAL SPINE FINDINGS Alignment: Normal. Skull base and vertebrae: No acute fracture. No primary bone lesion or focal pathologic process. Soft tissues and spinal canal: No prevertebral fluid or swelling. No visible canal hematoma. Disc levels: Moderate multilevel disc and facet degenerative disease. Upper chest: Negative. Other: None. IMPRESSION: 1.  No acute intracranial pathology. 2.  Small-vessel white matter disease. 3.  Soft tissue hematoma of the left frontoparietal scalp. 4.  No fracture or static subluxation of the cervical spine. Electronically Signed   By: Eddie Candle M.D.   On: 03/18/2019 18:32   Dg Chest Port 1 View  Result Date: 03/18/2019 CLINICAL DATA:   84 year old male status post unwitnessed fall. Chest wall pain and possible clavicle deformity. EXAM: PORTABLE CHEST 1 VIEW COMPARISON:  Chest radiographs 11/08/2015 and earlier. FINDINGS: Portable AP semi upright view at 1745 hours. Stable cardiac size at the upper limits of normal to mildly enlarged. Mild tortuosity of the aorta. Other mediastinal contours are within normal limits. Visualized tracheal air column is within normal limits. Lower lung volumes. Allowing  for portable technique the lungs are clear. No pneumothorax. No clavicle fracture is evident. And asymmetric widening of the left AC joint appears chronic and stable since 2012. No displaced rib fracture identified. No acute osseous abnormality identified. IMPRESSION: No acute cardiopulmonary abnormality or acute traumatic injury identified. Electronically Signed   By: Genevie Ann M.D.   On: 03/18/2019 18:25    Procedures Procedures (including critical care time)  Medications Ordered in ED Medications - No data to display   Initial Impression / Assessment and Plan / ED Course  I have reviewed the triage vital signs and the nursing notes.  Pertinent labs & imaging results that were available during my care of the patient were reviewed by me and considered in my medical decision making (see chart for details).    Patient with uti Some aki Plan iv fluids Rocephin Urine cx Wound care Likely dispo home if able to ambulate Vitals:   03/18/19 1745 03/18/19 1751  BP: (!) 151/88   Pulse: 97   Resp: 15   Temp:  97.7 F (36.5 C)  SpO2: 97%        Discussed Kennith Maes, PA  And will reevaluate after fluids and abx and assess for disposition  1 Fall 2 head hematoma secondary to 1 3 lower leg skin tear and hematoma 4 uti 5 aki  Discussed with daughter-in-law Jacqlyn Larsen and son Gwyndolyn Saxon.  They state the best number to reach them is Jacqlyn Larsen at [phone]  Final Clinical Impressions(s) / ED Diagnoses   Final diagnoses:  Fall,  initial encounter  Urinary tract infection without hematuria, site unspecified  Injury of head, initial encounter    ED Discharge Orders    None       Pattricia Boss, MD 03/18/19 Ward Chatters    Pattricia Boss, MD 03/18/19 1859

## 2019-03-18 NOTE — ED Triage Notes (Signed)
Pt arrives via [facility], from [facility]. Pt had unwitnessed fall, does not remember event. Staff reports only down for a few seconds. Pt not on thinners. Pt in c-collar, pt denies neck/back pain. Pt has deformity to chest wall/poss clavicle deformity. Pt has hematoma to left forehead and right shin. Pt endorses pain to right shin.

## 2019-03-19 ENCOUNTER — Observation Stay (HOSPITAL_COMMUNITY): Payer: Medicare Other

## 2019-03-19 ENCOUNTER — Other Ambulatory Visit: Payer: Self-pay

## 2019-03-19 DIAGNOSIS — S0990XA Unspecified injury of head, initial encounter: Secondary | ICD-10-CM | POA: Diagnosis present

## 2019-03-19 DIAGNOSIS — I82409 Acute embolism and thrombosis of unspecified deep veins of unspecified lower extremity: Secondary | ICD-10-CM | POA: Diagnosis not present

## 2019-03-19 DIAGNOSIS — W19XXXD Unspecified fall, subsequent encounter: Secondary | ICD-10-CM | POA: Diagnosis not present

## 2019-03-19 DIAGNOSIS — Z8551 Personal history of malignant neoplasm of bladder: Secondary | ICD-10-CM | POA: Diagnosis not present

## 2019-03-19 DIAGNOSIS — S8011XA Contusion of right lower leg, initial encounter: Secondary | ICD-10-CM | POA: Diagnosis present

## 2019-03-19 DIAGNOSIS — Z66 Do not resuscitate: Secondary | ICD-10-CM | POA: Diagnosis present

## 2019-03-19 DIAGNOSIS — M25562 Pain in left knee: Secondary | ICD-10-CM | POA: Diagnosis not present

## 2019-03-19 DIAGNOSIS — R000 Tachycardia, unspecified: Secondary | ICD-10-CM | POA: Diagnosis present

## 2019-03-19 DIAGNOSIS — N400 Enlarged prostate without lower urinary tract symptoms: Secondary | ICD-10-CM | POA: Diagnosis present

## 2019-03-19 DIAGNOSIS — Z23 Encounter for immunization: Secondary | ICD-10-CM | POA: Diagnosis present

## 2019-03-19 DIAGNOSIS — B9620 Unspecified Escherichia coli [E. coli] as the cause of diseases classified elsewhere: Secondary | ICD-10-CM | POA: Diagnosis present

## 2019-03-19 DIAGNOSIS — U071 COVID-19: Secondary | ICD-10-CM | POA: Diagnosis present

## 2019-03-19 DIAGNOSIS — S0003XA Contusion of scalp, initial encounter: Secondary | ICD-10-CM | POA: Diagnosis present

## 2019-03-19 DIAGNOSIS — N390 Urinary tract infection, site not specified: Secondary | ICD-10-CM | POA: Diagnosis present

## 2019-03-19 DIAGNOSIS — N179 Acute kidney failure, unspecified: Secondary | ICD-10-CM | POA: Diagnosis present

## 2019-03-19 DIAGNOSIS — R296 Repeated falls: Secondary | ICD-10-CM | POA: Diagnosis not present

## 2019-03-19 DIAGNOSIS — R2242 Localized swelling, mass and lump, left lower limb: Secondary | ICD-10-CM | POA: Diagnosis not present

## 2019-03-19 DIAGNOSIS — E860 Dehydration: Secondary | ICD-10-CM | POA: Diagnosis present

## 2019-03-19 DIAGNOSIS — Z7982 Long term (current) use of aspirin: Secondary | ICD-10-CM | POA: Diagnosis not present

## 2019-03-19 DIAGNOSIS — Z79899 Other long term (current) drug therapy: Secondary | ICD-10-CM | POA: Diagnosis not present

## 2019-03-19 DIAGNOSIS — R319 Hematuria, unspecified: Secondary | ICD-10-CM | POA: Diagnosis not present

## 2019-03-19 DIAGNOSIS — Z87891 Personal history of nicotine dependence: Secondary | ICD-10-CM | POA: Diagnosis not present

## 2019-03-19 DIAGNOSIS — Z791 Long term (current) use of non-steroidal anti-inflammatories (NSAID): Secondary | ICD-10-CM | POA: Diagnosis not present

## 2019-03-19 DIAGNOSIS — W19XXXA Unspecified fall, initial encounter: Secondary | ICD-10-CM | POA: Diagnosis present

## 2019-03-19 DIAGNOSIS — S81811A Laceration without foreign body, right lower leg, initial encounter: Secondary | ICD-10-CM | POA: Diagnosis present

## 2019-03-19 LAB — CBC
HCT: 28.7 % — ABNORMAL LOW (ref 39.0–52.0)
Hemoglobin: 9.9 g/dL — ABNORMAL LOW (ref 13.0–17.0)
MCH: 32.5 pg (ref 26.0–34.0)
MCHC: 34.5 g/dL (ref 30.0–36.0)
MCV: 94.1 fL (ref 80.0–100.0)
Platelets: 151 10*3/uL (ref 150–400)
RBC: 3.05 MIL/uL — ABNORMAL LOW (ref 4.22–5.81)
RDW: 13 % (ref 11.5–15.5)
WBC: 7.4 10*3/uL (ref 4.0–10.5)
nRBC: 0 % (ref 0.0–0.2)

## 2019-03-19 LAB — COMPREHENSIVE METABOLIC PANEL
ALT: 13 U/L (ref 0–44)
AST: 20 U/L (ref 15–41)
Albumin: 3 g/dL — ABNORMAL LOW (ref 3.5–5.0)
Alkaline Phosphatase: 30 U/L — ABNORMAL LOW (ref 38–126)
Anion gap: 9 (ref 5–15)
BUN: 24 mg/dL — ABNORMAL HIGH (ref 8–23)
CO2: 22 mmol/L (ref 22–32)
Calcium: 8.5 mg/dL — ABNORMAL LOW (ref 8.9–10.3)
Chloride: 105 mmol/L (ref 98–111)
Creatinine, Ser: 1.31 mg/dL — ABNORMAL HIGH (ref 0.61–1.24)
GFR calc Af Amer: 53 mL/min — ABNORMAL LOW (ref >60)
GFR calc non Af Amer: 46 mL/min — ABNORMAL LOW (ref >60)
Glucose, Bld: 137 mg/dL — ABNORMAL HIGH (ref 70–99)
Potassium: 3.8 mmol/L (ref 3.5–5.1)
Sodium: 136 mmol/L (ref 135–145)
Total Bilirubin: 0.8 mg/dL (ref 0.3–1.2)
Total Protein: 6.6 g/dL (ref 6.5–8.1)

## 2019-03-19 LAB — T4, FREE: Free T4: 0.92 ng/dL (ref 0.82–1.77)

## 2019-03-19 LAB — TSH: TSH: 1.622 u[IU]/mL (ref 0.350–4.500)

## 2019-03-19 LAB — FERRITIN: Ferritin: 1052 ng/mL — ABNORMAL HIGH (ref 24–336)

## 2019-03-19 LAB — PROCALCITONIN: Procalcitonin: <0.1 ng/mL

## 2019-03-19 LAB — TROPONIN I
Troponin I: 0.15 ng/mL (ref <0.03)
Troponin I: 0.14 ng/mL (ref <0.03)

## 2019-03-19 MED ORDER — IOHEXOL 350 MG/ML SOLN
60.0000 mL | Freq: Once | INTRAVENOUS | Status: AC | PRN
  Administered 2019-03-19: 60 mL via INTRAVENOUS

## 2019-03-19 MED ORDER — LACTATED RINGERS IV SOLN: INTRAVENOUS | Status: DC

## 2019-03-19 NOTE — NC FL2 (Signed)
[place] LEVEL OF CARE SCREENING TOOL     IDENTIFICATION  Patient Name: Zachary Obrien Birthdate: [birth year] Sex: male Admission Date (Current Location): 03/18/2019  [facility] and [place] Number:  Herbalist and Address:  [facility], [place] [address], [place], [place] [address]      Provider Number: [id number]  Attending Physician Name and Address:  Elodia Florence., *  Relative Name and Phone Number:       Current Level of Care: Hospital Recommended Level of Care: [place] Prior Approval Number:    Date Approved/Denied:   PASRR Number:    Discharge Plan: Other (Comment)(ALF)    Current Diagnoses: Patient Active Problem List   Diagnosis Date Noted  . COVID-19 virus infection 03/18/2019  . UTI (urinary tract infection) 03/18/2019  . Unwitnessed fall 03/18/2019  . Sinus tachycardia 03/18/2019  . AKI (acute kidney injury) ([place]) 03/18/2019  . CAP (community acquired pneumonia) 11/08/2015  . Pneumonia 11/08/2015  . Dermatophytosis of nail   . Bladder cancer ([place]) 10/25/2012    Orientation RESPIRATION BLADDER Height & Weight     Self, Time, Situation, Place  Normal Continent Weight: 171 lb 4.8 oz (77.7 kg) Height:  5\' 11"  (180.3 cm)  BEHAVIORAL SYMPTOMS/MOOD NEUROLOGICAL BOWEL NUTRITION STATUS      Continent Diet(heart healthy, thin liquids)  AMBULATORY STATUS COMMUNICATION OF NEEDS Skin   Limited Assist Verbally Normal                       Personal Care Assistance Level of Assistance  Dressing, Feeding, Bathing Bathing Assistance: Limited assistance Feeding assistance: Independent Dressing Assistance: Limited assistance     Functional Limitations Info  Sight, Hearing, Speech Sight Info: Adequate Hearing Info: Adequate Speech Info: Adequate    SPECIAL CARE FACTORS FREQUENCY  PT (By licensed PT), OT (By licensed OT)                    Contractures Contractures Info: Not  present    Additional Factors Info  Code Status, Allergies, Isolation Precautions Code Status Info: DNR Allergies Info: No known allergies     Isolation Precautions Info: COVID-19 POSITIVE     Current Medications (03/19/2019):  This is the current hospital active medication list Current Facility-Administered Medications  Medication Dose Route Frequency Provider Last Rate Last Dose  . acetaminophen (TYLENOL) tablet 650 mg  650 mg Oral Q6H PRN Shela Leff, MD      . cefTRIAXone (ROCEPHIN) 1 g in sodium chloride 0.9 % 100 mL IVPB  1 g Intravenous Q24H Shela Leff, MD 200 mL/hr at 03/19/19 0858 1 g at 03/19/19 0858  . cholecalciferol (VITAMIN D3) tablet 1,000 Units  1,000 Units Oral Daily Shela Leff, MD   1,000 Units at 03/19/19 0817  . diltiazem (CARDIZEM) injection 10 mg  10 mg Intravenous Q4H PRN Shela Leff, MD      . docusate sodium (COLACE) capsule 100 mg  100 mg Oral Daily PRN Shela Leff, MD      . ferrous sulfate tablet 325 mg  325 mg Oral BID WC Shela Leff, MD   325 mg at 03/19/19 0818  . finasteride (PROSCAR) tablet 5 mg  5 mg Oral Daily Shela Leff, MD   5 mg at 03/19/19 0818  . vitamin B-12 (CYANOCOBALAMIN) tablet 500 mcg  500 mcg Oral Daily Shela Leff, MD   500 mcg at 03/19/19 0819  . vitamin C (  ASCORBIC ACID) tablet 500 mg  500 mg Oral BID Shela Leff, MD   500 mg at 03/19/19 0818  . zinc sulfate capsule 220 mg  220 mg Oral Daily Shela Leff, MD   220 mg at 03/19/19 0818     Discharge Medications: Please see discharge summary for a list of discharge medications.  Relevant Imaging Results:  Relevant Lab Results:   Additional Information SSN:[id number]  Gerrianne Scale Domique Clapper, LCSW

## 2019-03-19 NOTE — Evaluation (Signed)
Physical Therapy Evaluation Patient Details Name: Zachary Obrien MRN: [id number] DOB: 1920/12/25 Today's Date: 03/19/2019   History of Present Illness  Pt adm after unwitnessed fall at ALF. Pt found to have UTI and is COVID +. PMH - bladder CA, TURP, TKR, BPH  Clinical Impression  Pt presents to PT requiring assist for all mobility. Pt is from [facility]. Do not feel pt can manage at ALF level presently. Recommend ST-SNF prior to return to ALF.     Follow Up Recommendations SNF;Supervision/Assistance - 24 hour    Equipment Recommendations  None recommended by PT    Recommendations for Other Services       Precautions / Restrictions Precautions Precautions: Fall Restrictions Weight Bearing Restrictions: No      Mobility  Bed Mobility Overal bed mobility: Needs Assistance Bed Mobility: Supine to Sit;Sit to Supine     Supine to sit: Mod assist;HOB elevated Sit to supine: Mod assist   General bed mobility comments: Assist to bring legs off of bed, elevate trunk into sitting and bring hips to EOB. Assist to lower trunk and bring feet up into bed to return to supine.   Transfers Overall transfer level: Needs assistance Equipment used: Rolling walker (2 wheeled) Transfers: Sit to/from Stand Sit to Stand: Mod assist         General transfer comment: Assist to bring hips up and for balance. Incr time to rise.   Ambulation/Gait Ambulation/Gait assistance: Min assist Gait Distance (Feet): 3 Feet Assistive device: Rolling walker (2 wheeled) Gait Pattern/deviations: Step-to pattern;Decreased step length - right;Decreased step length - left;Shuffle;Trunk flexed Gait velocity: decr Gait velocity interpretation: <1.31 ft/sec, indicative of household ambulator General Gait Details: Assist for balance and support. Amb 3' forward and then 3' backward. Side stepped up side of bed.   Stairs            Wheelchair Mobility    Modified Rankin (Stroke Patients  Only)       Balance Overall balance assessment: Needs assistance Sitting-balance support: Bilateral upper extremity supported;Feet supported Sitting balance-Leahy Scale: Poor Sitting balance - Comments: UE support and min to min guard for static sitting Postural control: Posterior lean Standing balance support: Bilateral upper extremity supported Standing balance-Leahy Scale: Poor Standing balance comment: walker and mod to min assist for static standing. Initial posterior lean                             Pertinent Vitals/Pain Pain Assessment: Faces Faces Pain Scale: Hurts little more Pain Location: rt leg Pain Descriptors / Indicators: Sore Pain Intervention(s): Limited activity within patient's tolerance;Monitored during session;Repositioned    Home Living Family/patient expects to be discharged to:: Assisted living               Home Equipment: Walker - 2 wheels Additional Comments: Pt legally blind    Prior Function Level of Independence: Needs assistance   Gait / Transfers Assistance Needed: modified independent with amb with walker           Hand Dominance        Extremity/Trunk Assessment   Upper Extremity Assessment Upper Extremity Assessment: Generalized weakness    Lower Extremity Assessment Lower Extremity Assessment: Generalized weakness       Communication   Communication: HOH  Cognition Arousal/Alertness: Awake/alert Behavior During Therapy: WFL for tasks assessed/performed Overall Cognitive Status: No family/caregiver present to determine baseline cognitive functioning Area of Impairment: Orientation;Problem solving  Orientation Level: Place           Problem Solving: Requires verbal cues        General Comments      Exercises     Assessment/Plan    PT Assessment Patient needs continued PT services  PT Problem List Decreased strength;Decreased activity tolerance;Decreased  balance;Decreased mobility       PT Treatment Interventions DME instruction;Gait training;Functional mobility training;Therapeutic activities;Therapeutic exercise;Balance training;Patient/family education    PT Goals (Current goals can be found in the Care Plan section)  Acute Rehab PT Goals Patient Stated Goal: Agreeable to work toward walking again PT Goal Formulation: With patient Time For Goal Achievement: 04/02/19 Potential to Achieve Goals: Good    Frequency Min 3X/week   Barriers to discharge Decreased caregiver support      Co-evaluation               AM-PAC PT "6 Clicks" Mobility  Outcome Measure Help needed turning from your back to your side while in a flat bed without using bedrails?: A Lot Help needed moving from lying on your back to sitting on the side of a flat bed without using bedrails?: A Lot Help needed moving to and from a bed to a chair (including a wheelchair)?: A Lot Help needed standing up from a chair using your arms (e.g., wheelchair or bedside chair)?: A Lot Help needed to walk in hospital room?: A Little Help needed climbing 3-5 steps with a railing? : Total 6 Click Score: 12    End of Session   Activity Tolerance: Patient limited by fatigue Patient left: in bed;with call bell/phone within reach;Other (comment)(bed alarm not working) Nurse Communication: Mobility status;Other (comment)(nurse aware bed alarm not working) PT Visit Diagnosis: Unsteadiness on feet (R26.81);Other abnormalities of gait and mobility (R26.89);History of falling (Z91.81);Muscle weakness (generalized) (M62.81)    Time: 8099-8338 PT Time Calculation (min) (ACUTE ONLY): 26 min   Charges:   PT Evaluation $PT Eval Moderate Complexity: 1 Mod PT Treatments $Gait Training: 8-22 mins        [place] Pager [phone] Office [place] 03/19/2019, 4:31 PM

## 2019-03-19 NOTE — Progress Notes (Signed)
Lower extremity venous has been completed.   Preliminary results in CV Proc.   Abram Sander 03/19/2019 9:36 AM

## 2019-03-19 NOTE — Progress Notes (Signed)
Attempted to call report to the nurse at [facility]. She asked me to take a number but the phone was disconnected.

## 2019-03-19 NOTE — Progress Notes (Signed)
85 year old male from assisted living found to have UTI and COVID positive.  Had fall and after evaluation, it was felt that patient would need short-term skilled nursing.  Because of positive COVID, will need to be treated for this before being able to be transferred there.  Moved from [place] over to [facility].  Seen after arrival to [place].  Patient complains of some mild knee and leg pain where he had fallen 2 days ago.  Otherwise fatigue.  No other complaints.  On IV Rocephin for UTI.

## 2019-03-19 NOTE — Progress Notes (Addendum)
PROGRESS NOTE    NICHOLS CORTER  GBT:[id number] DOB: [birth year] DOA: 03/18/2019 PCP: Josetta Huddle, MD   Brief Narrative:  Zachary Obrien is Zachary Obrien 85 y.o. male with medical history significant of bladder cancer, BPH, and conditions listed below presenting to the hospital via EMS from his nursing home for evaluation after an unwitnessed fall.  Staff reported to EMS that patient was only down for Griselda Bramblett few seconds.  He is not on blood thinners.  He was noted to have Halayna Blane laceration to his right lower leg and Lorey Pallett hematoma of his head.  EMS reported deformity to the chest wall but patient stated this was from Brailon Don fall several years ago and he has no chest injury or complaints.  States he was standing up and then fell.  Does not know what exactly happened.  Denies any dizziness/lightheadedness, chest pain, or shortness of breath at that time.  He is not sure if he lost consciousness.  Denies any fevers, chills, cough, nausea, vomiting, abdominal pain, diarrhea, dysuria, or urinary frequency/urgency.  No other complaints.  Complaining of pain in his right shin region.   Assessment & Plan:   Principal Problem:   COVID-19 virus infection Active Problems:   UTI (urinary tract infection)   Unwitnessed fall   Sinus tachycardia   AKI (acute kidney injury) ([place])   COVID-19 virus infection Seems asymptomatic from this. CT chest without PE, bibasilar subsegmental atelectasis LDH wnl, ferritin elevated, CRP elevated, normal lactic acid Procal negative Elevated d dimer, normal fibrinogen -Vitamin C 500 mg twice daily -Zinc 220 mg daily -Droplet and contact precautions -negative CT for PE and [place] for DVT  E. Coli UTI Continue ceftriaxone Follow culture and susceptibilities Normal WBC, afebrile  Unwitnessed fall, possible syncope Patient had an unwitnessed fall at his nursing home.  Not clear if he lost consciousness.  He's unable to describe any events around the fall, just that he remembers being found  down. Has chronic sternoclavicular dislocation, skin tear and hematoma at RLE and bruising to L scalp Differential broad, but possibly related to UTI or COVID19 infection?  Dehydration in setting of tachycardia below?  Negative orthostatics.  EKG appears sinus tach, but p waves difficult to see.  Tachy improved today, follow repeat EKG.  Continue telemetry.      Right tib fib imaging without acute injury CT head/neck without acute injury.  Soft tissue hematoma to L frontoparietal scalp Unable to perform echo due to COVID19 positive status For now, continue to monitor on tele Continue IVF  PT  Sinus tachycardia Improved today Follow repeat EKG S/p IVF and cardizem on admission Improved, will continue to follow  Normal thyroid studies  Elevated Troponin: likely demand in setting of above, continue to monitor.    AKI Baseline creatinine 1, 1.4 on presentation No BMP drawn today, he's s/p 2 L bolus and 125 cc/hr MIVF x 12 hrs. Will hold off additional fluids for now and f/u add on BMP if possible, otherwise follow tomorrow and adjust fluids as needed. UA c/w UTI, negative protein, 6-10 RBC  BPH -Continue finasteride  15 mm subpleural nodular density: needs outpatient follow up  DVT prophylaxis: SCD Code Status: DNR Family Communication: none at bedside - son over phone - Zachary Obrien [phone] Disposition Plan: pending -> plan for transfer to [facility].  Pt came from [place].  PT recommending SNF.  Will need placement.  Inpatient given UTI with need for IV antibiotics, COVID19 positive without safe d/c  at this point.   Consultants:   none  Procedures:  LE [place] Prelim  Summary: Right: There is no evidence of deep vein thrombosis in the lower extremity. No cystic structure found in the popliteal fossa. Left: There is no evidence of deep vein thrombosis in the lower extremity. No cystic structure found in the popliteal fossa.  Antimicrobials:    Anti-infectives (From admission, onward)   Start     Dose/Rate Route Frequency Ordered Stop   03/19/19 1000  cefTRIAXone (ROCEPHIN) 1 g in sodium chloride 0.9 % 100 mL IVPB     1 g 200 mL/hr over 30 Minutes Intravenous Every 24 hours 03/18/19 2156     03/18/19 1830  cefTRIAXone (ROCEPHIN) 1 g in sodium chloride 0.9 % 100 mL IVPB     1 g 200 mL/hr over 30 Minutes Intravenous  Once 03/18/19 1817 03/18/19 2041     Subjective: C/o being cold RLE pain. Otherwise ok.  No CP or SOB.  Can't say how he fell.  ?LOC, not sure. No loss of taste, smell, cough, fever.    Objective: Vitals:   03/18/19 2145 03/18/19 2200 03/18/19 2332 03/19/19 0800  BP: 137/81 135/77  129/73  Pulse: (!) 112 (!) 106  86  Resp: (!) 22 (!) 23  (!) 21  Temp:  99 F (37.2 C)  99 F (37.2 C)  TempSrc:    Oral  SpO2: 95% 93%  93%  Weight:   77.7 kg   Height:   5\' 11"  (1.803 m)     Intake/Output Summary (Last 24 hours) at 03/19/2019 1514 Last data filed at 03/19/2019 0900 Gross per 24 hour  Intake 4457.38 ml  Output 1450 ml  Net 3007.38 ml   Filed Weights   03/18/19 2332  Weight: 77.7 kg    Examination:  General exam: Appears calm and comfortable  Bruising to L side of head Respiratory system: Clear to auscultation. Respiratory effort normal.  Cardiovascular system: S1 & S2 heard, RRR.  Gastrointestinal system: Abdomen is nondistended, soft and nontender.  Central nervous system: Alert and oriented. No focal neurological deficits. Extremities: skin tear to L shin, hematoma Skin: No rashes, lesions or ulcers Psychiatry: Judgement and insight appear normal. Mood & affect appropriate.     Data Reviewed: I have personally reviewed following labs and imaging studies  CBC: Recent Labs  Lab 03/18/19 1736 03/18/19 2234 03/19/19 1141  WBC 5.3  --  7.4  NEUTROABS  --  5.0  --   HGB 11.9*  --  9.9*  HCT 37.0*  --  28.7*  MCV 98.9  --  94.1  PLT 151  --  932   Basic Metabolic Panel: Recent Labs   Lab 03/18/19 1736  NA 137  K 4.1  CL 105  CO2 22  GLUCOSE 118*  BUN 35*  CREATININE 1.46*  CALCIUM 9.2   GFR: Estimated Creatinine Clearance: 32.2 mL/min (Branch Pacitti) (by C-G formula based on SCr of 1.46 mg/dL (H)). Liver Function Tests: No results for input(s): AST, ALT, ALKPHOS, BILITOT, PROT, ALBUMIN in the last 168 hours. No results for input(s): LIPASE, AMYLASE in the last 168 hours. No results for input(s): AMMONIA in the last 168 hours. Coagulation Profile: No results for input(s): INR, PROTIME in the last 168 hours. Cardiac Enzymes: Recent Labs  Lab 03/18/19 2234 03/19/19 0419 03/19/19 1141  TROPONINI 0.09* 0.15* 0.14*   BNP (last 3 results) No results for input(s): PROBNP in the last 8760 hours. HbA1C: No results for  input(s): HGBA1C in the last 72 hours. CBG: No results for input(s): GLUCAP in the last 168 hours. Lipid Profile: No results for input(s): CHOL, HDL, LDLCALC, TRIG, CHOLHDL, LDLDIRECT in the last 72 hours. Thyroid Function Tests: Recent Labs    03/18/19 2234  TSH 1.622  FREET4 0.92   Anemia Panel: Recent Labs    03/18/19 2234  FERRITIN 1,052*   Sepsis Labs: Recent Labs  Lab 03/18/19 2234  PROCALCITON <0.10  LATICACIDVEN 1.2    Recent Results (from the past 240 hour(s))  SARS Coronavirus 2 (CEPHEID - Performed in [place] hospital lab), Hosp Order     Status: Abnormal   Collection Time: 03/18/19  5:36 PM  Result Value Ref Range Status   SARS Coronavirus 2 POSITIVE (Brittan Mapel) NEGATIVE Final    Comment: CRITICAL RESULT CALLED TO, READ BACK BY AND VERIFIED WITH: RN SANDS, M [id number]  FCP (NOTE) If result is NEGATIVE SARS-CoV-2 target nucleic acids are NOT DETECTED. The SARS-CoV-2 RNA is generally detectable in upper and lower  respiratory specimens during the acute phase of infection. The lowest  concentration of SARS-CoV-2 viral copies this assay can detect is 250  copies / mL. Kasey Hansell negative result does not preclude SARS-CoV-2 infection   and should not be used as the sole basis for treatment or other  patient management decisions.  Fremont Skalicky negative result may occur with  improper specimen collection / handling, submission of specimen other  than nasopharyngeal swab, presence of viral mutation(s) within the  areas targeted by this assay, and inadequate number of viral copies  (<250 copies / mL). Nayelly Laughman negative result must be combined with clinical  observations, patient history, and epidemiological information. If result is POSITIVE SARS-CoV-2 target nucleic acids are DETECTED.  The SARS-CoV-2 RNA is generally detectable in upper and lower  respiratory specimens during the acute phase of infection.  Positive  results are indicative of active infection with SARS-CoV-2.  Clinical  correlation with patient history and other diagnostic information is  necessary to determine patient infection status.  Positive results do  not rule out bacterial infection or co-infection with other viruses. If result is PRESUMPTIVE POSTIVE SARS-CoV-2 nucleic acids MAY BE PRESENT.   Javarion Douty presumptive positive result was obtained on the submitted specimen  and confirmed on repeat testing.  While 2019 novel coronavirus  (SARS-CoV-2) nucleic acids may be present in the submitted sample  additional confirmatory testing may be necessary for epidemiological  and / or clinical management purposes  to differentiate between  SARS-CoV-2 and other Sarbecovirus currently known to infect humans.  If clinically indicated additional testing with an alternate test  methodology [phone])  is advised. The SARS-CoV-2 RNA is generally  detectable in upper and lower respiratory specimens during the acute  phase of infection. The expected result is Negative. Fact Sheet for Patients:  StrictlyIdeas.no Fact Sheet for Healthcare Providers: BankingDealers.co.za This test is not yet approved or cleared by the [place] FDA  and has been authorized for detection and/or diagnosis of SARS-CoV-2 by FDA under an Emergency Use Authorization (EUA).  This EUA will remain in effect (meaning this test can be used) for the duration of the COVID-19 declaration under Section 564(b)(1) of the Act, 21 U.S.C. section 360bbb-3(b)(1), unless the authorization is terminated or revoked sooner. Performed at [facility], [place] [address], [place], [place] [address]   Urine Culture     Status: Abnormal (Preliminary result)   Collection Time: 03/18/19  5:37 PM  Result Value Ref  Range Status   Specimen Description URINE, CLEAN CATCH  Final   Special Requests NONE  Final   Culture (Zymir Napoli)  Final    >=100,000 COLONIES/mL ESCHERICHIA COLI SUSCEPTIBILITIES TO FOLLOW Performed at [facility], [address], [place], [place] [address]    Report Status PENDING  Incomplete         Radiology Studies: Dg Tibia/fibula Right  Result Date: 03/18/2019 CLINICAL DATA:  85 year old male status post unwitnessed fall. Pain. EXAM: RIGHT TIBIA AND FIBULA - 2 VIEW COMPARISON:  Right knee series 08/31/2004. FINDINGS: Interval total right knee arthroplasty. Hardware appears intact and normally aligned. No joint effusion is evident on the lateral view. Calcified peripheral vascular disease appears increased. Alignment at the right ankle is maintained. No acute osseous abnormality identified. IMPRESSION: 1. No acute fracture or dislocation identified about the right tib-fib. 2. Right knee hardware appears intact. 3. Calcified peripheral vascular disease. Electronically Signed   By: Genevie Ann M.D.   On: 03/18/2019 18:26   Ct Head Wo Contrast  Result Date: 03/18/2019 CLINICAL DATA:  Trauma EXAM: CT HEAD WITHOUT CONTRAST CT CERVICAL SPINE WITHOUT CONTRAST TECHNIQUE: Multidetector CT imaging of the head and cervical spine was performed following the standard protocol without intravenous contrast. Multiplanar CT image reconstructions of the cervical  spine were also generated. COMPARISON:  05/01/2018 FINDINGS: CT HEAD FINDINGS Brain: No evidence of acute infarction, hemorrhage, hydrocephalus, extra-axial collection or mass lesion/mass effect. Periventricular white matter hypodensity. Vascular: No hyperdense vessel or unexpected calcification. Skull: Normal. Negative for fracture or focal lesion. Sinuses/Orbits: No acute finding. Other: Soft tissue hematoma of the left frontoparietal scalp. CT CERVICAL SPINE FINDINGS Alignment: Normal. Skull base and vertebrae: No acute fracture. No primary bone lesion or focal pathologic process. Soft tissues and spinal canal: No prevertebral fluid or swelling. No visible canal hematoma. Disc levels: Moderate multilevel disc and facet degenerative disease. Upper chest: Negative. Other: None. IMPRESSION: 1.  No acute intracranial pathology. 2.  Small-vessel white matter disease. 3.  Soft tissue hematoma of the left frontoparietal scalp. 4.  No fracture or static subluxation of the cervical spine. Electronically Signed   By: Eddie Candle M.D.   On: 03/18/2019 18:32   Ct Angio Chest Pe W Or Wo Contrast  Result Date: 03/19/2019 CLINICAL DATA:  85 year old male with concern for pulmonary embolism. EXAM: CT ANGIOGRAPHY CHEST WITH CONTRAST TECHNIQUE: Multidetector CT imaging of the chest was performed using the standard protocol during bolus administration of intravenous contrast. Multiplanar CT image reconstructions and MIPs were obtained to evaluate the vascular anatomy. CONTRAST:  64mL OMNIPAQUE IOHEXOL 350 MG/ML SOLN COMPARISON:  Chest radiograph dated 03/18/2019 FINDINGS: Cardiovascular: There is mild cardiomegaly. No pericardial effusion. Coronary vascular calcification primarily involving the LAD. There is moderate atherosclerotic calcification of the thoracic aorta. Evaluation of the pulmonary arteries is limited due to respiratory motion artifact and suboptimal visualization of the peripheral branches. No definite  pulmonary artery embolus identified. Mediastinum/Nodes: There is no hilar or mediastinal adenopathy. The esophagus is grossly unremarkable. No mediastinal fluid collection. Lungs/Pleura: There are bibasilar subsegmental atelectasis. No focal consolidation, pleural effusion, or pneumothorax. Tony Friscia 15 mm subpleural nodular density at the right lung base (series 7, image 67) may represent an area of atelectasis or scarring. Attention on follow-up imaging recommended. The central airways are patent. Upper Abdomen: Partially visualized stone within the gallbladder. The visualized upper abdomen is otherwise unremarkable. Musculoskeletal: Degenerative changes of the spine. Anterior dislocation of the left clavicular head at the sternoclavicular junction, likely  chronic. No acute osseous pathology. Review of the MIP images confirms the above findings. IMPRESSION: 1. No CT evidence of pulmonary embolism. 2. Bibasilar subsegmental atelectasis. Mataio Mele 15 mm subpleural nodular density at the right lung base may represent an area of atelectasis or scarring. Attention to this area on follow-up imaging recommended. Electronically Signed   By: Anner Crete M.D.   On: 03/19/2019 03:42   Ct Cervical Spine Wo Contrast  Result Date: 03/18/2019 CLINICAL DATA:  Trauma EXAM: CT HEAD WITHOUT CONTRAST CT CERVICAL SPINE WITHOUT CONTRAST TECHNIQUE: Multidetector CT imaging of the head and cervical spine was performed following the standard protocol without intravenous contrast. Multiplanar CT image reconstructions of the cervical spine were also generated. COMPARISON:  05/01/2018 FINDINGS: CT HEAD FINDINGS Brain: No evidence of acute infarction, hemorrhage, hydrocephalus, extra-axial collection or mass lesion/mass effect. Periventricular white matter hypodensity. Vascular: No hyperdense vessel or unexpected calcification. Skull: Normal. Negative for fracture or focal lesion. Sinuses/Orbits: No acute finding. Other: Soft tissue hematoma of the  left frontoparietal scalp. CT CERVICAL SPINE FINDINGS Alignment: Normal. Skull base and vertebrae: No acute fracture. No primary bone lesion or focal pathologic process. Soft tissues and spinal canal: No prevertebral fluid or swelling. No visible canal hematoma. Disc levels: Moderate multilevel disc and facet degenerative disease. Upper chest: Negative. Other: None. IMPRESSION: 1.  No acute intracranial pathology. 2.  Small-vessel white matter disease. 3.  Soft tissue hematoma of the left frontoparietal scalp. 4.  No fracture or static subluxation of the cervical spine. Electronically Signed   By: Eddie Candle M.D.   On: 03/18/2019 18:32   Dg Chest Port 1 View  Result Date: 03/18/2019 CLINICAL DATA:  85 year old male status post unwitnessed fall. Chest wall pain and possible clavicle deformity. EXAM: PORTABLE CHEST 1 VIEW COMPARISON:  Chest radiographs 11/08/2015 and earlier. FINDINGS: Portable AP semi upright view at 1745 hours. Stable cardiac size at the upper limits of normal to mildly enlarged. Mild tortuosity of the aorta. Other mediastinal contours are within normal limits. Visualized tracheal air column is within normal limits. Lower lung volumes. Allowing for portable technique the lungs are clear. No pneumothorax. No clavicle fracture is evident. And asymmetric widening of the left AC joint appears chronic and stable since 2012. No displaced rib fracture identified. No acute osseous abnormality identified. IMPRESSION: No acute cardiopulmonary abnormality or acute traumatic injury identified. Electronically Signed   By: Genevie Ann M.D.   On: 03/18/2019 18:25   Vas [place] Lower Extremity Venous (dvt)  Result Date: 03/19/2019  Lower Venous Study Indications: Positive d dimer.  Performing Technologist: Abram Sander RVS  Examination Guidelines: Noah Pelaez complete evaluation includes B-mode imaging, spectral Doppler, color Doppler, and power Doppler as needed of all accessible portions of each vessel. Bilateral testing  is considered an integral part of Karina Lenderman complete examination. Limited examinations for reoccurring indications may be performed as noted.  +---------+---------------+---------+-----------+----------+--------------+  RIGHT     Compressibility Phasicity Spontaneity Properties Summary         +---------+---------------+---------+-----------+----------+--------------+  CFV       Full            Yes       Yes                                    +---------+---------------+---------+-----------+----------+--------------+  SFJ       Full                                                             +---------+---------------+---------+-----------+----------+--------------+  FV Prox   Full                                                             +---------+---------------+---------+-----------+----------+--------------+  FV Mid    Full                                                             +---------+---------------+---------+-----------+----------+--------------+  FV Distal Full                                                             +---------+---------------+---------+-----------+----------+--------------+  PFV       Full                                                             +---------+---------------+---------+-----------+----------+--------------+  POP       Full            Yes       Yes                                    +---------+---------------+---------+-----------+----------+--------------+  PTV       Full                                                             +---------+---------------+---------+-----------+----------+--------------+  PERO                                                       Not visualized  +---------+---------------+---------+-----------+----------+--------------+   +---------+---------------+---------+-----------+----------+--------------+  LEFT      Compressibility Phasicity Spontaneity Properties Summary          +---------+---------------+---------+-----------+----------+--------------+  CFV       Full            Yes       Yes                                    +---------+---------------+---------+-----------+----------+--------------+  SFJ       Full                                                             +---------+---------------+---------+-----------+----------+--------------+  FV Prox   Full                                                             +---------+---------------+---------+-----------+----------+--------------+  FV Mid    Full                                                             +---------+---------------+---------+-----------+----------+--------------+  FV Distal Full                                                             +---------+---------------+---------+-----------+----------+--------------+  PFV       Full                                                             +---------+---------------+---------+-----------+----------+--------------+  POP       Full            Yes       Yes                                    +---------+---------------+---------+-----------+----------+--------------+  PTV       Full                                                             +---------+---------------+---------+-----------+----------+--------------+  PERO                                                       Not visualized  +---------+---------------+---------+-----------+----------+--------------+     Summary: Right: There is no evidence of deep vein thrombosis in the lower extremity. No cystic structure found in the popliteal fossa. Left: There is no evidence of deep vein thrombosis in the lower extremity. No cystic structure found in the popliteal fossa.  *See table(s) above for measurements and observations.    Preliminary         Scheduled Meds:  cholecalciferol  1,000 Units Oral Daily   ferrous sulfate  325 mg Oral BID WC   finasteride  5 mg Oral Daily   vitamin B-12  500  mcg Oral Daily   vitamin C  500 mg Oral BID   zinc sulfate  220 mg Oral Daily   Continuous Infusions:  cefTRIAXone (ROCEPHIN)  IV 1 g (03/19/19 0858)  LOS: 0 days    Time spent: over 30 min    Fayrene Helper, MD [facility] Pager AMION  If 7PM-7AM, please contact night-coverage [email or web address] Password [facility] 03/19/2019, 3:14 PM

## 2019-03-19 NOTE — Progress Notes (Signed)
Report given to the RN at [facility].

## 2019-03-19 NOTE — Plan of Care (Signed)
  Problem: Pain Managment: Goal: General experience of comfort will improve Outcome: Progressing   Problem: Skin Integrity: Goal: Risk for impaired skin integrity will decrease Outcome: Not Progressing

## 2019-03-20 ENCOUNTER — Inpatient Hospital Stay (HOSPITAL_COMMUNITY): Payer: Medicare Other

## 2019-03-20 DIAGNOSIS — R296 Repeated falls: Secondary | ICD-10-CM

## 2019-03-20 DIAGNOSIS — N179 Acute kidney failure, unspecified: Secondary | ICD-10-CM

## 2019-03-20 DIAGNOSIS — R319 Hematuria, unspecified: Secondary | ICD-10-CM

## 2019-03-20 DIAGNOSIS — R000 Tachycardia, unspecified: Secondary | ICD-10-CM

## 2019-03-20 DIAGNOSIS — N390 Urinary tract infection, site not specified: Secondary | ICD-10-CM

## 2019-03-20 LAB — URINE CULTURE: Culture: >=100000 — AB

## 2019-03-20 MED ORDER — HEPARIN SODIUM (PORCINE) 5000 UNIT/ML IJ SOLN
7500.0000 [IU] | Freq: Three times a day (TID) | INTRAMUSCULAR | Status: DC
  Administered 2019-03-20 – 2019-03-22 (×7): 7500 [IU] via SUBCUTANEOUS
  Filled 2019-03-20 (×7): qty 2

## 2019-03-20 MED ORDER — LIDOCAINE 5 % EX PTCH
1.0000 | MEDICATED_PATCH | CUTANEOUS | Status: DC
  Administered 2019-03-20 – 2019-03-22 (×3): 1 via TRANSDERMAL
  Filled 2019-03-20 (×4): qty 1

## 2019-03-20 NOTE — Progress Notes (Addendum)
   03/20/19 1542  PT Visit Information  Assistance Needed +2  PT/OT/SLP Co-Evaluation/Treatment Yes  History of Present Illness Pt adm after unwitnessed fall at ALF. Pt found to have UTI and is COVID +. PMH - bladder CA, TURP, TKR, BPH  Precautions  Precautions Fall  Precaution Comments painful L knee  Pain Assessment  Faces Pain Scale 6  Pain Location L knee  Pain Descriptors / Indicators Grimacing;Guarding;Aching;Sore  Pain Intervention(s) Limited activity within patient's tolerance;Monitored during session  Cognition  Arousal/Alertness Awake/alert  Behavior During Therapy [facility] for tasks assessed/performed  Overall Cognitive Status No family/caregiver present to determine baseline cognitive functioning  Area of Impairment Memory;Safety/judgement;Awareness;Problem solving  Bed Mobility  Sit to supine Max assist  General bed mobility comments assist with trunk and legs to  return to supine  Transfers  Overall transfer level Needs assistance  Transfer via Lift Equipment Stedy  Transfers Sit to/from Stand  Sit to Stand Max assist;+2 physical assistance  General transfer comment assisted to stand in STEDY and then transfer to bsc then to bed., max of 2  to stand and then sit down to bed  PT - End of Session  Activity Tolerance Patient limited by fatigue;Patient limited by pain  Patient left in bed;with call bell/phone within reach  Nurse Communication Mobility status;Need for lift equipment   PT - Assessment/Plan  PT Plan Current plan remains appropriate  PT Visit Diagnosis Unsteadiness on feet (R26.81);Other abnormalities of gait and mobility (R26.89);History of falling (Z91.81);Muscle weakness (generalized) (M62.81)  PT Frequency (ACUTE ONLY) Min 3X/week  Follow Up Recommendations SNF;Supervision/Assistance - 24 hour  AM-PAC PT "6 Clicks" Mobility Outcome Measure (Version 2)  Help needed turning from your back to your side while in a flat bed without using bedrails? 2  Help needed  moving from lying on your back to sitting on the side of a flat bed without using bedrails? 2  Help needed moving to and from a bed to a chair (including a wheelchair)? 1  Help needed standing up from a chair using your arms (e.g., wheelchair or bedside chair)? 1  Help needed to walk in hospital room? 1  Help needed climbing 3-5 steps with a railing?  1  6 Click Score 8  Consider Recommendation of Discharge To: CIR/SNF/LTACH  PT Goal Progression  Progress towards PT goals Progressing toward goals  PT Time Calculation  PT Start Time (ACUTE ONLY) 1030  PT Stop Time (ACUTE ONLY) 1100  PT Time Calculation (min) (ACUTE ONLY) 30 min  PT General Charges  $$ ACUTE PT VISIT 1 Visit  PT Treatments  $Therapeutic Activity 8-22 mins  Tresa Endo PT Acute Rehabilitation Services Pager [phone] Office [phone]

## 2019-03-20 NOTE — Progress Notes (Signed)
[facility] Research Department and AHF Team  ReDS Research Project   Patient #: [id number]  ReDS Measurement  Right: 34 %  Left: 37 %

## 2019-03-20 NOTE — Progress Notes (Signed)
Occupational Therapy Treatment Patient Details Name: Zachary Obrien MRN: [id number] DOB: December 05, 1920 Today's Date: 03/20/2019    History of present illness Pt adm after unwitnessed fall at ALF. Pt found to have UTI and is COVID +. PMH - bladder CA, TURP, TKR, BPH   OT comments  Assisted pt to [facility] then to bed with Denna Haggard using Max A +2. Pt limited by painful L knee and generalized weakness. Left in chair position in bed, talking on the phone with his son. Will continue to follow acutely.    Follow Up Recommendations  SNF;Supervision/Assistance - 24 hour    Equipment Recommendations  None recommended by OT    Recommendations for Other Services      Precautions / Restrictions Precautions Precautions: Fall Precaution Comments: painful L knee       Mobility Bed Mobility Overal bed mobility: Needs Assistance Bed Mobility: Sit to Supine     Supine to sit: Max assist;+2 for physical assistance        Transfers Overall transfer level: Needs assistance   Transfers: Sit to/from Stand Sit to Stand: Max assist;+2 physical assistance              Balance Overall balance assessment: History of Falls   Sitting balance-Leahy Scale: Fair       Standing balance-Leahy Scale: Poor                             ADL either performed or assessed with clinical judgement   ADL Overall ADL's : Needs assistance/impaired                         Toilet Transfer: +2 for physical assistance;BSC;Maximal assistance   Toileting- Clothing Manipulation and Hygiene: Maximal assistance Toileting - Clothing Manipulation Details (indicate cue type and reason): Able to notify staff he needed to have a BM     Functional mobility during ADLs: Maximal assistance;+2 for physical assistance(Sara Charlaine Dalton)       Vision       Perception     Praxis      Cognition Arousal/Alertness: Awake/alert Behavior During Therapy: WFL for tasks assessed/performed Overall  Cognitive Status: No family/caregiver present to determine baseline cognitive functioning Area of Impairment: Memory;Safety/judgement;Awareness;Problem solving                 Orientation Level: Disoriented to;Time   Memory: Decreased short-term memory   Safety/Judgement: Decreased awareness of deficits Awareness: Emergent Problem Solving: Slow processing;Difficulty sequencing General Comments: Performance affected by [facility] and being legally blind        Exercises     Shoulder Instructions       General Comments      Pertinent Vitals/ Pain       Pain Assessment: Faces Faces Pain Scale: Hurts even more Pain Location: L knee Pain Descriptors / Indicators: Grimacing;Guarding;Aching;Sore Pain Intervention(s): Limited activity within patient's tolerance;Repositioned;Ice applied  Home Living                                          Prior Functioning/Environment              Frequency  Min 3X/week        Progress Toward Goals  OT Goals(current goals can now be found in the care plan section)  Progress towards OT  goals: Progressing toward goals  Acute Rehab OT Goals Patient Stated Goal: to get stronger and be able to do more for himself OT Goal Formulation: With patient Time For Goal Achievement: 04/03/19 Potential to Achieve Goals: Good ADL Goals Pt Will Perform Grooming: with set-up;sitting Pt Will Perform Upper Body Bathing: with set-up;sitting Pt Will Perform Lower Body Bathing: with mod assist;sit to/from stand;with adaptive equipment Pt Will Transfer to Toilet: with mod assist;bedside commode;stand pivot transfer Pt/caregiver will Perform Home Exercise Program: With Supervision;Increased strength;Both right and left upper extremity  Plan Discharge plan remains appropriate    Co-evaluation    PT/OT/SLP Co-Evaluation/Treatment: Yes Reason for Co-Treatment: To address functional/ADL transfers;For patient/therapist safety   OT  goals addressed during session: ADL's and self-care      AM-PAC OT "6 Clicks" Daily Activity     Outcome Measure   Help from another person eating meals?: None Help from another person taking care of personal grooming?: A Little Help from another person toileting, which includes using toliet, bedpan, or urinal?: A Lot Help from another person bathing (including washing, rinsing, drying)?: A Lot Help from another person to put on and taking off regular upper body clothing?: A Lot Help from another person to put on and taking off regular lower body clothing?: A Lot 6 Click Score: 15    End of Session Equipment Utilized During Treatment: Gait belt  OT Visit Diagnosis: Unsteadiness on feet (R26.81);Other abnormalities of gait and mobility (R26.89);Muscle weakness (generalized) (M62.81);History of falling (Z91.81);Pain Pain - Right/Left: Left Pain - part of body: Knee   Activity Tolerance Patient tolerated treatment well   Patient Left in bed;with call bell/phone within reach;with bed alarm set(Chair position)   Nurse Communication Mobility status;Need for lift equipment;Other (comment)(painful L knee)        Time: 0881-1031 OT Time Calculation (min): 28 min  Charges: OT General Charges $OT Visit: 1 Visit OT Treatments $Self Care/Home Management : 8-22 mins  Maurie Boettcher, OT/L   Acute OT Clinical Specialist [place] Pager [phone] Office [phone]    [facility] 03/20/2019, 1:25 PM

## 2019-03-20 NOTE — NC FL2 (Addendum)
[place] LEVEL OF CARE SCREENING TOOL     IDENTIFICATION  Patient Name: CLEARNCE LEJA Birthdate: Nov 25, 1920 Sex: male Admission Date (Current Location): 03/18/2019  [facility] and [place] Number:  Herbalist and Address:  [facility], [place] [address] [place], [place] [address]      Provider Number: [id number]  Attending Physician Name and Address:  Patrecia Pour, MD  Relative Name and Phone Number:       Current Level of Care: Hospital Recommended Level of Care: [place] Prior Approval Number:    Date Approved/Denied:   PASRR Number:   [id number]   Discharge Plan: SNF    Current Diagnoses: Patient Active Problem List   Diagnosis Date Noted  . COVID-19 virus infection 03/18/2019  . UTI (urinary tract infection) 03/18/2019  . Unwitnessed fall 03/18/2019  . Sinus tachycardia 03/18/2019  . AKI (acute kidney injury) ([place]) 03/18/2019  . CAP (community acquired pneumonia) 11/08/2015  . Pneumonia 11/08/2015  . Dermatophytosis of nail   . Bladder cancer ([place]) 10/25/2012    Orientation RESPIRATION BLADDER Height & Weight     Self, Time, Place, Situation  Normal Continent Weight: 174 lb 14.4 oz (79.3 kg) Height:  5\' 10"  (177.8 cm)  BEHAVIORAL SYMPTOMS/MOOD NEUROLOGICAL BOWEL NUTRITION STATUS      Continent Diet(SEE DC summary )  AMBULATORY STATUS COMMUNICATION OF NEEDS Skin   Limited Assist Verbally Normal                       Personal Care Assistance Level of Assistance  Bathing, Feeding, Dressing Bathing Assistance: Limited assistance Feeding assistance: Independent Dressing Assistance: Limited assistance     Functional Limitations Info  Sight, Hearing, Speech Sight Info: Adequate Hearing Info: Adequate Speech Info: Adequate    SPECIAL CARE FACTORS FREQUENCY  PT (By licensed PT), OT (By licensed OT)     PT Frequency: 5 OT Frequency: 5            Contractures Contractures Info:  Not present    Additional Factors Info  Code Status, Allergies Code Status Info: DNR  Allergies Info: NKA      Isolation Precautions Info: covid positive      Current Medications (03/20/2019):  This is the current hospital active medication list Current Facility-Administered Medications  Medication Dose Route Frequency Provider Last Rate Last Dose  . acetaminophen (TYLENOL) tablet 650 mg  650 mg Oral Q6H PRN Elodia Florence., MD      . cefTRIAXone (ROCEPHIN) 1 g in sodium chloride 0.9 % 100 mL IVPB  1 g Intravenous Q24H Elodia Florence., MD 200 mL/hr at 03/20/19 1049 1 g at 03/20/19 1049  . cholecalciferol (VITAMIN D3) tablet 1,000 Units  1,000 Units Oral Daily Elodia Florence., MD   1,000 Units at 03/20/19 [phone]  . docusate sodium (COLACE) capsule 100 mg  100 mg Oral Daily PRN Elodia Florence., MD      . ferrous sulfate tablet 325 mg  325 mg Oral BID WC Elodia Florence., MD   325 mg at 03/20/19 0836  . finasteride (PROSCAR) tablet 5 mg  5 mg Oral Daily Elodia Florence., MD   5 mg at 03/20/19 0836  . heparin injection 7,500 Units  7,500 Units Subcutaneous Q8H Vance Gather B, MD      . lidocaine (LIDODERM) 5 % 1 patch  1 patch Transdermal Q24H Vance Gather  B, MD      . vitamin B-12 (CYANOCOBALAMIN) tablet 500 mcg  500 mcg Oral Daily Elodia Florence., MD   500 mcg at 03/20/19 [phone]  . vitamin C (ASCORBIC ACID) tablet 500 mg  500 mg Oral BID Elodia Florence., MD   500 mg at 03/20/19 0834  . zinc sulfate capsule 220 mg  220 mg Oral Daily Elodia Florence., MD   220 mg at 03/20/19 3128     Discharge Medications: Please see discharge summary for a list of discharge medications.  Relevant Imaging Results:  Relevant Lab Results:   Additional Information SSN:[id number]  Weston Anna, LCSW

## 2019-03-20 NOTE — Progress Notes (Signed)
Occupational Therapy Evaluation Patient Details Name: Zachary Obrien MRN: [id number] DOB: May 29, 1921 Today's Date: 03/20/2019    History of Present Illness Pt adm after unwitnessed fall at ALF. Pt found to have UTI and is COVID +. PMH - bladder CA, TURP, TKR, BPH   Clinical Impression   PTA, pt lived at [facility] and was modified independent with mobility @ RW level. Modifeid independent with ADL with the exception of S for bathing. Pt demonstrates a significatn functional decline, requiring Max A +2 for sit - stand with use of Denna Haggard. PT unable to use RW and step onto LLE due to complaints of pain and inability to extend L knee fully. Pt demonstrates decline in function since PT eval yesterday. HR into low 120s with moblity; other VSS. Spoke with son [name]) regarding his Dad over the phone. Pt will benefit from acute OT to facilitate return to SNF for rehab.     Follow Up Recommendations  SNF;Supervision/Assistance - 24 hour    Equipment Recommendations  None recommended by OT    Recommendations for Other Services       Precautions / Restrictions Precautions Precautions: Fall Precaution Comments: painful L knee Restrictions Weight Bearing Restrictions: No      Mobility Bed Mobility Overal bed mobility: Needs Assistance Bed Mobility: Supine to Sit     Supine to sit: Max assist;HOB elevated        Transfers Overall transfer level: Needs assistance   Transfers: Sit to/from Stand Sit to Stand: Max assist;+2 physical assistance         General transfer comment: Attempted to use RW; pt unableto achieve full upright posture; unableto bear weight on LLE to step    Balance Overall balance assessment: History of Falls;Needs assistance   Sitting balance-Leahy Scale: Fair Sitting balance - Comments: posterior bias initially     Standing balance-Leahy Scale: Poor Standing balance comment: reliant on external support                            ADL either performed or assessed with clinical judgement   ADL Overall ADL's : Needs assistance/impaired Eating/Feeding: Set up;Sitting   Grooming: Minimal assistance;Sitting   Upper Body Bathing: Minimal assistance;Sitting   Lower Body Bathing: Maximal assistance;Sit to/from stand   Upper Body Dressing : Moderate assistance;Sitting   Lower Body Dressing: Maximal assistance;Bed level       Toileting- Clothing Manipulation and Hygiene: Maximal assistance       Functional mobility during ADLs: Maximal assistance;+2 for physical assistance General ADL Comments: Use of Denna Haggard     Vision Baseline Vision/History: Legally blind;Wears glasses       Perception     Praxis      Pertinent Vitals/Pain Pain Assessment: Faces Faces Pain Scale: Hurts even more Pain Location: L knee Pain Descriptors / Indicators: Grimacing;Guarding;Aching;Sore Pain Intervention(s): Limited activity within patient's tolerance;Ice applied;Repositioned     Hand Dominance Right   Extremity/Trunk Assessment Upper Extremity Assessment Upper Extremity Assessment: Generalized weakness   Lower Extremity Assessment Lower Extremity Assessment: Defer to PT evaluation   Cervical / Trunk Assessment Cervical / Trunk Assessment: Kyphotic(forward head)   Communication Communication Communication: HOH   Cognition Arousal/Alertness: Awake/alert Behavior During Therapy: WFL for tasks assessed/performed Overall Cognitive Status: No family/caregiver present to determine baseline cognitive functioning Area of Impairment: Memory;Safety/judgement;Awareness;Problem solving                 Orientation Level: Disoriented to;Time  Memory: Decreased short-term memory   Safety/Judgement: Decreased awareness of deficits Awareness: Emergent Problem Solving: Slow processing;Difficulty sequencing General Comments: Performance affected by [facility] and being legally blind   General Comments  enjoys bppks  on tape    Exercises     Shoulder Instructions      Home Living Family/patient expects to be discharged to:: Skilled nursing facility                                        Prior Functioning/Environment Level of Independence: Independent with assistive device(s);Needs assistance  Gait / Transfers Assistance Needed: modified independent with amb with walker ADL's / Homemaking Assistance Needed: S with bathing; otherwise modified independent            OT Problem List: Decreased strength;Decreased range of motion;Decreased activity tolerance;Impaired balance (sitting and/or standing);Decreased safety awareness;Pain;Increased edema      OT Treatment/Interventions: Self-care/ADL training;Therapeutic exercise;DME and/or AE instruction;Neuromuscular education;Therapeutic activities;Patient/family education;Balance training    OT Goals(Current goals can be found in the care plan section) Acute Rehab OT Goals Patient Stated Goal: to get stronger and be able to do more for himself OT Goal Formulation: With patient Time For Goal Achievement: 04/03/19 Potential to Achieve Goals: Good  OT Frequency: Min 3X/week   Barriers to D/C:            Co-evaluation              AM-PAC OT "6 Clicks" Daily Activity     Outcome Measure Help from another person eating meals?: None Help from another person taking care of personal grooming?: A Little Help from another person toileting, which includes using toliet, bedpan, or urinal?: A Lot Help from another person bathing (including washing, rinsing, drying)?: A Lot Help from another person to put on and taking off regular upper body clothing?: A Lot Help from another person to put on and taking off regular lower body clothing?: A Lot 6 Click Score: 15   End of Session Equipment Utilized During Treatment: Gait belt;Rolling walker;Other (comment)(Sara Stedy) Nurse Communication: Mobility status;Need for lift  equipment  Activity Tolerance: Patient tolerated treatment well Patient left: in chair;with call bell/phone within reach;with chair alarm set  OT Visit Diagnosis: Unsteadiness on feet (R26.81);Other abnormalities of gait and mobility (R26.89);Muscle weakness (generalized) (M62.81);History of falling (Z91.81);Pain Pain - Right/Left: Left Pain - part of body: Knee                Time: 0017-4944 OT Time Calculation (min): 57 min Charges:  OT General Charges $OT Visit: 1 Visit OT Evaluation $OT Eval Moderate Complexity: 1 Mod OT Treatments $Self Care/Home Management : 38-52 mins  Maurie Boettcher, OT/L   Acute OT Clinical Specialist Acute Rehabilitation Services Pager [phone] Office [phone]   [facility] 03/20/2019, 10:11 AM

## 2019-03-20 NOTE — Progress Notes (Signed)
PROGRESS NOTE  Zachary Obrien  ZOX:[id number] DOB: 02/20/1921 DOA: 03/18/2019 PCP: Josetta Huddle, MD   Brief Narrative: Zachary Obrien a 85 y.o.malewith medical history significant ofbladder cancer, BPH, and conditions listed below presenting to the hospital via EMS from his nursing home for evaluation after an unwitnessed fall. Staff reported to EMS that patient was only down for a few seconds. He is not on blood thinners. He was noted to have a laceration to his right lower leg and a hematoma of his head. EMS reported deformity to the chest wall but patient stated this was from a fall several years ago and he has no chest injury or complaints. States he was standing up and then fell. Does not know what exactly happened. Denies any dizziness/lightheadedness, chest pain, or shortness of breath at that time. He is not sure if he lost consciousness. Denies any fevers, chills, cough, nausea, vomiting, abdominal pain, diarrhea, dysuria, orurinary frequency/urgency. No other complaints.Complaining of pain in his right shin region.  Assessment & Plan: Principal Problem:   COVID-19 virus infection Active Problems:   UTI (urinary tract infection)   Unwitnessed fall   Sinus tachycardia   AKI (acute kidney injury) ([place])  Covid-19 infection: Largely asymptomatic. - Continue supportive measures/isolation/monitoring.  - Will follow up with CSW if pt requires negative testing for disposition, positive was 5/10.   Unwitnessed fall: With amnesia for the event, given age there is suspicion for cardiogenic syncope.  - Continue telemetry - One covid negative, should get echocardiogram.  - PT/OT evaluations ongoing  Left leg/knee pain: Following fall.  - XR w/sunrise view - Ice, lidocaine patch. Tylenol prn. Avoid NSAIDs.  Sinus tachycardia: Improving. TSH 1.622. No evidence of bleeding, suspect due to infection and hypovolemia which has improved. - ECG personally reviewed showing  NSR w/vent rate 87, 1st deg. AVB and PVC x2. WTc 412msec. No ischemic changes.  Elevated troponin: Suspect due to demand ischemia in the absence of chest pain or STE on ECG.  Elevated d-dimer: Due to covid most likely, and is elevated out of proportion of other inflammatory markers. No PE on CTA and no DVT on LE U/S. - Intermediate dose heparin 7,500 u q8h.  E. coli UTI:  - Continue ceftriaxone with pending susceptibility data.   AKI: Improving slightly from 1.46 > 1.31, baseline ~1.  - Avoid nephrotoxins - Monitor BMP  BPH:  - Continue finasteride, not retaining at this time.   Right lung base subpleural nodule: ~6mm.  - Consider repeat imaging after resolution of acute issues.  DVT prophylaxis: Heparin Code Status: DNR Family Communication: Son by phone Disposition Plan: SNF when bed available  Consultants:   None  Procedures:   None  Antimicrobials:  Ceftriaxone 5/10 >>    Subjective: Left knee bothering him, sore, constant pain nonradiating worse when bearing weight w/therapy. Denies dyspnea, chest pain, palpitations, dizziness, or fever. No other complaints.  Objective: Vitals:   03/19/19 2300 03/20/19 0000 03/20/19 0400 03/20/19 0800  BP: 127/75  (!) 144/86   Pulse: 88  92   Resp: (!) 22  (!) 21   Temp: 99 F (37.2 C)  97.8 F (36.6 C) 97.9 F (36.6 C)  TempSrc: Oral  Oral Oral  SpO2: 98%  96%   Weight:  79.3 kg    Height:  5\' 10"  (1.778 m)      Intake/Output Summary (Last 24 hours) at 03/20/2019 1213 Last data filed at 03/19/2019 1900 Gross per 24 hour  Intake 600 ml  Output 600 ml  Net 0 ml   Filed Weights   03/18/19 2332 03/20/19 0000  Weight: 77.7 kg 79.3 kg   Gen: 85 y.o. male in no distress appearing well for his age 34: Non-labored breathing room air. Clear to auscultation bilaterally.  CV: Regular rate and rhythm. No murmur, rub, or gallop. No JVD, no pedal edema. GI: Abdomen soft, non-tender, non-distended, with normoactive bowel  sounds. No organomegaly or masses felt. MSK: Warm, Left proximal clavicular deformity (chronic). Left knee is swollen and tender without palpation deformities Skin: No other rashes, lesions or ulcers Neuro: Alert and oriented. No focal neurological deficits. Psych: Judgement and insight appear normal. Mood & affect appropriate.   Data Reviewed: I have personally reviewed following labs and imaging studies  CBC: Recent Labs  Lab 03/18/19 1736 03/18/19 2234 03/19/19 1141  WBC 5.3  --  7.4  NEUTROABS  --  5.0  --   HGB 11.9*  --  9.9*  HCT 37.0*  --  28.7*  MCV 98.9  --  94.1  PLT 151  --  433   Basic Metabolic Panel: Recent Labs  Lab 03/18/19 1736 03/19/19 1934  NA 137 136  K 4.1 3.8  CL 105 105  CO2 22 22  GLUCOSE 118* 137*  BUN 35* 24*  CREATININE 1.46* 1.31*  CALCIUM 9.2 8.5*   GFR: Estimated Creatinine Clearance: 34.8 mL/min (A) (by C-G formula based on SCr of 1.31 mg/dL (H)). Liver Function Tests: Recent Labs  Lab 03/19/19 1934  AST 20  ALT 13  ALKPHOS 30*  BILITOT 0.8  PROT 6.6  ALBUMIN 3.0*   No results for input(s): LIPASE, AMYLASE in the last 168 hours. No results for input(s): AMMONIA in the last 168 hours. Coagulation Profile: No results for input(s): INR, PROTIME in the last 168 hours. Cardiac Enzymes: Recent Labs  Lab 03/18/19 2234 03/19/19 0419 03/19/19 1141  TROPONINI 0.09* 0.15* 0.14*   BNP (last 3 results) No results for input(s): PROBNP in the last 8760 hours. HbA1C: No results for input(s): HGBA1C in the last 72 hours. CBG: No results for input(s): GLUCAP in the last 168 hours. Lipid Profile: No results for input(s): CHOL, HDL, LDLCALC, TRIG, CHOLHDL, LDLDIRECT in the last 72 hours. Thyroid Function Tests: Recent Labs    03/18/19 2234  TSH 1.622  FREET4 0.92   Anemia Panel: Recent Labs    03/18/19 2234  FERRITIN 1,052*   Urine analysis:    Component Value Date/Time   COLORURINE YELLOW 03/18/2019 1736   APPEARANCEUR  CLOUDY (A) 03/18/2019 1736   LABSPEC 1.014 03/18/2019 1736   PHURINE 5.0 03/18/2019 1736   GLUCOSEU NEGATIVE 03/18/2019 1736   HGBUR SMALL (A) 03/18/2019 1736   BILIRUBINUR NEGATIVE 03/18/2019 1736   KETONESUR NEGATIVE 03/18/2019 1736   PROTEINUR NEGATIVE 03/18/2019 1736   UROBILINOGEN 0.2 01/10/2009 1100   NITRITE NEGATIVE 03/18/2019 1736   LEUKOCYTESUR LARGE (A) 03/18/2019 1736   Recent Results (from the past 240 hour(s))  SARS Coronavirus 2 (CEPHEID - Performed in [facility] hospital lab), Hosp Order     Status: Abnormal   Collection Time: 03/18/19  5:36 PM  Result Value Ref Range Status   SARS Coronavirus 2 POSITIVE (A) NEGATIVE Final    Comment: CRITICAL RESULT CALLED TO, READ BACK BY AND VERIFIED WITH: RN SANDS, M [id number]  FCP (NOTE) If result is NEGATIVE SARS-CoV-2 target nucleic acids are NOT DETECTED. The SARS-CoV-2 RNA is generally detectable in upper and lower  respiratory specimens  during the acute phase of infection. The lowest  concentration of SARS-CoV-2 viral copies this assay can detect is 250  copies / mL. A negative result does not preclude SARS-CoV-2 infection  and should not be used as the sole basis for treatment or other  patient management decisions.  A negative result may occur with  improper specimen collection / handling, submission of specimen other  than nasopharyngeal swab, presence of viral mutation(s) within the  areas targeted by this assay, and inadequate number of viral copies  (<250 copies / mL). A negative result must be combined with clinical  observations, patient history, and epidemiological information. If result is POSITIVE SARS-CoV-2 target nucleic acids are DETECTED.  The SARS-CoV-2 RNA is generally detectable in upper and lower  respiratory specimens during the acute phase of infection.  Positive  results are indicative of active infection with SARS-CoV-2.  Clinical  correlation with patient history and other diagnostic  information is  necessary to determine patient infection status.  Positive results do  not rule out bacterial infection or co-infection with other viruses. If result is PRESUMPTIVE POSTIVE SARS-CoV-2 nucleic acids MAY BE PRESENT.   A presumptive positive result was obtained on the submitted specimen  and confirmed on repeat testing.  While 2019 novel coronavirus  (SARS-CoV-2) nucleic acids may be present in the submitted sample  additional confirmatory testing may be necessary for epidemiological  and / or clinical management purposes  to differentiate between  SARS-CoV-2 and other Sarbecovirus currently known to infect humans.  If clinically indicated additional testing with an alternate test  methodology [phone])  is advised. The SARS-CoV-2 RNA is generally  detectable in upper and lower respiratory specimens during the acute  phase of infection. The expected result is Negative. Fact Sheet for Patients:  StrictlyIdeas.no Fact Sheet for Healthcare Providers: BankingDealers.co.za This test is not yet approved or cleared by the [place] FDA and has been authorized for detection and/or diagnosis of SARS-CoV-2 by FDA under an Emergency Use Authorization (EUA).  This EUA will remain in effect (meaning this test can be used) for the duration of the COVID-19 declaration under Section 564(b)(1) of the Act, 21 U.S.C. section 360bbb-3(b)(1), unless the authorization is terminated or revoked sooner. Performed at [facility], [place] [address], [place], [place] [address]   Urine Culture     Status: Abnormal   Collection Time: 03/18/19  5:37 PM  Result Value Ref Range Status   Specimen Description URINE, CLEAN CATCH  Final   Special Requests   Final    NONE Performed at [facility], [place] [address] [place], [place] [address]    Culture >=100,000 COLONIES/mL ESCHERICHIA COLI (A)  Final   Report Status 03/20/2019 FINAL  Final    Organism ID, Bacteria ESCHERICHIA COLI (A)  Final      Susceptibility   Escherichia coli - MIC*    AMPICILLIN <=2 SENSITIVE Sensitive     CEFAZOLIN <=4 SENSITIVE Sensitive     CEFTRIAXONE <=1 SENSITIVE Sensitive     CIPROFLOXACIN <=0.25 SENSITIVE Sensitive     GENTAMICIN 2 SENSITIVE Sensitive     IMIPENEM <=0.25 SENSITIVE Sensitive     NITROFURANTOIN <=16 SENSITIVE Sensitive     TRIMETH/SULFA >=320 RESISTANT Resistant     AMPICILLIN/SULBACTAM <=2 SENSITIVE Sensitive     PIP/TAZO <=4 SENSITIVE Sensitive     Extended ESBL NEGATIVE Sensitive     * >=100,000 COLONIES/mL ESCHERICHIA COLI      Radiology Studies: Dg Tibia/fibula Right  Result Date: 03/18/2019 CLINICAL DATA:  85 year old male status post unwitnessed fall. Pain. EXAM: RIGHT TIBIA AND FIBULA - 2 VIEW COMPARISON:  Right knee series 08/31/2004. FINDINGS: Interval total right knee arthroplasty. Hardware appears intact and normally aligned. No joint effusion is evident on the lateral view. Calcified peripheral vascular disease appears increased. Alignment at the right ankle is maintained. No acute osseous abnormality identified. IMPRESSION: 1. No acute fracture or dislocation identified about the right tib-fib. 2. Right knee hardware appears intact. 3. Calcified peripheral vascular disease. Electronically Signed   By: Genevie Ann M.D.   On: 03/18/2019 18:26   Ct Head Wo Contrast  Result Date: 03/18/2019 CLINICAL DATA:  Trauma EXAM: CT HEAD WITHOUT CONTRAST CT CERVICAL SPINE WITHOUT CONTRAST TECHNIQUE: Multidetector CT imaging of the head and cervical spine was performed following the standard protocol without intravenous contrast. Multiplanar CT image reconstructions of the cervical spine were also generated. COMPARISON:  05/01/2018 FINDINGS: CT HEAD FINDINGS Brain: No evidence of acute infarction, hemorrhage, hydrocephalus, extra-axial collection or mass lesion/mass effect. Periventricular white matter hypodensity. Vascular: No hyperdense  vessel or unexpected calcification. Skull: Normal. Negative for fracture or focal lesion. Sinuses/Orbits: No acute finding. Other: Soft tissue hematoma of the left frontoparietal scalp. CT CERVICAL SPINE FINDINGS Alignment: Normal. Skull base and vertebrae: No acute fracture. No primary bone lesion or focal pathologic process. Soft tissues and spinal canal: No prevertebral fluid or swelling. No visible canal hematoma. Disc levels: Moderate multilevel disc and facet degenerative disease. Upper chest: Negative. Other: None. IMPRESSION: 1.  No acute intracranial pathology. 2.  Small-vessel white matter disease. 3.  Soft tissue hematoma of the left frontoparietal scalp. 4.  No fracture or static subluxation of the cervical spine. Electronically Signed   By: Eddie Candle M.D.   On: 03/18/2019 18:32   Ct Angio Chest Pe W Or Wo Contrast  Result Date: 03/19/2019 CLINICAL DATA:  85 year old male with concern for pulmonary embolism. EXAM: CT ANGIOGRAPHY CHEST WITH CONTRAST TECHNIQUE: Multidetector CT imaging of the chest was performed using the standard protocol during bolus administration of intravenous contrast. Multiplanar CT image reconstructions and MIPs were obtained to evaluate the vascular anatomy. CONTRAST:  4mL OMNIPAQUE IOHEXOL 350 MG/ML SOLN COMPARISON:  Chest radiograph dated 03/18/2019 FINDINGS: Cardiovascular: There is mild cardiomegaly. No pericardial effusion. Coronary vascular calcification primarily involving the LAD. There is moderate atherosclerotic calcification of the thoracic aorta. Evaluation of the pulmonary arteries is limited due to respiratory motion artifact and suboptimal visualization of the peripheral branches. No definite pulmonary artery embolus identified. Mediastinum/Nodes: There is no hilar or mediastinal adenopathy. The esophagus is grossly unremarkable. No mediastinal fluid collection. Lungs/Pleura: There are bibasilar subsegmental atelectasis. No focal consolidation, pleural  effusion, or pneumothorax. A 15 mm subpleural nodular density at the right lung base (series 7, image 67) may represent an area of atelectasis or scarring. Attention on follow-up imaging recommended. The central airways are patent. Upper Abdomen: Partially visualized stone within the gallbladder. The visualized upper abdomen is otherwise unremarkable. Musculoskeletal: Degenerative changes of the spine. Anterior dislocation of the left clavicular head at the sternoclavicular junction, likely chronic. No acute osseous pathology. Review of the MIP images confirms the above findings. IMPRESSION: 1. No CT evidence of pulmonary embolism. 2. Bibasilar subsegmental atelectasis. A 15 mm subpleural nodular density at the right lung base may represent an area of atelectasis or scarring. Attention to this area on follow-up imaging recommended. Electronically Signed   By: Anner Crete M.D.   On: 03/19/2019 03:42  Ct Cervical Spine Wo Contrast  Result Date: 03/18/2019 CLINICAL DATA:  Trauma EXAM: CT HEAD WITHOUT CONTRAST CT CERVICAL SPINE WITHOUT CONTRAST TECHNIQUE: Multidetector CT imaging of the head and cervical spine was performed following the standard protocol without intravenous contrast. Multiplanar CT image reconstructions of the cervical spine were also generated. COMPARISON:  05/01/2018 FINDINGS: CT HEAD FINDINGS Brain: No evidence of acute infarction, hemorrhage, hydrocephalus, extra-axial collection or mass lesion/mass effect. Periventricular white matter hypodensity. Vascular: No hyperdense vessel or unexpected calcification. Skull: Normal. Negative for fracture or focal lesion. Sinuses/Orbits: No acute finding. Other: Soft tissue hematoma of the left frontoparietal scalp. CT CERVICAL SPINE FINDINGS Alignment: Normal. Skull base and vertebrae: No acute fracture. No primary bone lesion or focal pathologic process. Soft tissues and spinal canal: No prevertebral fluid or swelling. No visible canal hematoma.  Disc levels: Moderate multilevel disc and facet degenerative disease. Upper chest: Negative. Other: None. IMPRESSION: 1.  No acute intracranial pathology. 2.  Small-vessel white matter disease. 3.  Soft tissue hematoma of the left frontoparietal scalp. 4.  No fracture or static subluxation of the cervical spine. Electronically Signed   By: Eddie Candle M.D.   On: 03/18/2019 18:32   Dg Chest Port 1 View  Result Date: 03/18/2019 CLINICAL DATA:  85 year old male status post unwitnessed fall. Chest wall pain and possible clavicle deformity. EXAM: PORTABLE CHEST 1 VIEW COMPARISON:  Chest radiographs 11/08/2015 and earlier. FINDINGS: Portable AP semi upright view at 1745 hours. Stable cardiac size at the upper limits of normal to mildly enlarged. Mild tortuosity of the aorta. Other mediastinal contours are within normal limits. Visualized tracheal air column is within normal limits. Lower lung volumes. Allowing for portable technique the lungs are clear. No pneumothorax. No clavicle fracture is evident. And asymmetric widening of the left AC joint appears chronic and stable since 2012. No displaced rib fracture identified. No acute osseous abnormality identified. IMPRESSION: No acute cardiopulmonary abnormality or acute traumatic injury identified. Electronically Signed   By: Genevie Ann M.D.   On: 03/18/2019 18:25   Vas [place] Lower Extremity Venous (dvt)  Result Date: 03/20/2019  Lower Venous Study Indications: Positive d dimer.  Performing Technologist: Abram Sander RVS  Examination Guidelines: A complete evaluation includes B-mode imaging, spectral Doppler, color Doppler, and power Doppler as needed of all accessible portions of each vessel. Bilateral testing is considered an integral part of a complete examination. Limited examinations for reoccurring indications may be performed as noted.  +---------+---------------+---------+-----------+----------+--------------+  RIGHT      Compressibility Phasicity Spontaneity Properties Summary         +---------+---------------+---------+-----------+----------+--------------+  CFV       Full            Yes       Yes                                    +---------+---------------+---------+-----------+----------+--------------+  SFJ       Full                                                             +---------+---------------+---------+-----------+----------+--------------+  FV Prox   Full                                                             +---------+---------------+---------+-----------+----------+--------------+  FV Mid    Full                                                             +---------+---------------+---------+-----------+----------+--------------+  FV Distal Full                                                             +---------+---------------+---------+-----------+----------+--------------+  PFV       Full                                                             +---------+---------------+---------+-----------+----------+--------------+  POP       Full            Yes       Yes                                    +---------+---------------+---------+-----------+----------+--------------+  PTV       Full                                                             +---------+---------------+---------+-----------+----------+--------------+  PERO                                                       Not visualized  +---------+---------------+---------+-----------+----------+--------------+   +---------+---------------+---------+-----------+----------+--------------+  LEFT      Compressibility Phasicity Spontaneity Properties Summary         +---------+---------------+---------+-----------+----------+--------------+  CFV       Full            Yes       Yes                                    +---------+---------------+---------+-----------+----------+--------------+  SFJ       Full                                                              +---------+---------------+---------+-----------+----------+--------------+  FV Prox   Full                                                             +---------+---------------+---------+-----------+----------+--------------+  FV Mid    Full                                                             +---------+---------------+---------+-----------+----------+--------------+  FV Distal Full                                                             +---------+---------------+---------+-----------+----------+--------------+  PFV       Full                                                             +---------+---------------+---------+-----------+----------+--------------+  POP       Full            Yes       Yes                                    +---------+---------------+---------+-----------+----------+--------------+  PTV       Full                                                             +---------+---------------+---------+-----------+----------+--------------+  PERO                                                       Not visualized  +---------+---------------+---------+-----------+----------+--------------+     Summary: Right: There is no evidence of deep vein thrombosis in the lower extremity. No cystic structure found in the popliteal fossa. Left: There is no evidence of deep vein thrombosis in the lower extremity. No cystic structure found in the popliteal fossa.  *See table(s) above for measurements and observations. Electronically signed by Ruta Hinds MD on 03/20/2019 at 9:43:14 AM.    Final     Scheduled Meds:  cholecalciferol  1,000 Units Oral Daily   ferrous sulfate  325 mg Oral BID WC   finasteride  5 mg Oral Daily   vitamin B-12  500 mcg Oral Daily   vitamin C  500 mg Oral BID   zinc sulfate  220 mg Oral Daily   Continuous Infusions:  cefTRIAXone (ROCEPHIN)  IV 1 g (03/20/19 1049)     LOS: 1 day   Time spent: 35 minutes.  Patrecia Pour, MD [facility] [email or web address] Password [facility] 03/20/2019, 12:13 PM

## 2019-03-20 NOTE — Progress Notes (Signed)
Called patient's son, Gwyndolyn Saxon at [phone] and gave him updates on patient. Answered all of his questions regarding patient. Told him to feel free to contact hospital anytime he had questions or if he wanted to speak with patient.

## 2019-03-20 NOTE — TOC Initial Note (Signed)
Transition of Care [facility]) - Initial/Assessment Note    Patient Details  Name: Zachary Obrien MRN: [id number] Date of Birth: Jul 10, 1921  Transition [facility]) CM/SW Contact:    Weston Anna, LCSW Phone Number: [phone] 03/20/2019, 12:15 PM  Clinical Narrative:                 CSW spoke with patients son, [name], via phone regarding disposition. Patient is currently living at [facility] and son has no concerns with current living arrangements. Patient is covid positive and son is aware that this may cause barriers to discharge. PT is currently recommending SNF place- son is open to this but would prefer patient stay in the [place] area. CSW explained that only a few facilities are accepting covid positive patients ([facility], [facility], [facility],and [facility])- son voiced understanding and ability to be flexible with placement options. CSW will fax information to the above facility and continue to update family.    Expected Discharge Plan: Skilled Nursing Facility Barriers to Discharge: Other (comment), SNF Pending bed offer(covid +)   Patient Goals and CMS Choice Patient states their goals for this hospitalization and ongoing recovery are:: (get back to [facility] )   Choice offered to / list presented to : (no offers at this time)  Expected Discharge Plan and Services Expected Discharge Plan: [place] In-house Referral: Clinical Social Work Discharge Planning Services: NA Post Acute Care Choice: [place] Living arrangements for the past 2 months: Assisted Living Facility([facility])                 DME Arranged: N/A DME Agency: NA       HH Arranged: NA [place] Agency: NA        Prior Living Arrangements/Services Living arrangements for the past 2 months: Assisted Living Facility([facility]) Lives with:: Facility Resident Patient language and need for interpreter reviewed:: No Do you feel safe going back to the  place where you live?: Yes      Need for Family Participation in Patient Care: Yes (Comment) Care giver support system in place?: Yes (comment)(Son- Zachary(Bill)) Current home services: (NA) Criminal Activity/Legal Involvement Pertinent to Current Situation/Hospitalization: No - Comment as needed  Activities of Daily Living Home Assistive Devices/Equipment: None ADL Screening (condition at time of admission) Patient's cognitive ability adequate to safely complete daily activities?: No Is the patient deaf or have difficulty hearing?: Yes Does the patient have difficulty seeing, even when wearing glasses/contacts?: No Does the patient have difficulty concentrating, remembering, or making decisions?: No Patient able to express need for assistance with ADLs?: Yes Does the patient have difficulty dressing or bathing?: Yes Independently performs ADLs?: No Communication: Independent Dressing (OT): Appropriate for developmental age (83) Grooming: Appropriate for developmental age (83) Feeding: Appropriate for developmental age (83) Bathing: Appropriate for developmental age (83) 52: Appropriate for developmental age (83) In/Out Bed: Needs assistance (83) Is this a change from baseline?: Pre-admission baseline Walks in Home: Appropriate for developmental age (83) Does the patient have difficulty walking or climbing stairs?: No Weakness of Legs: Both Weakness of Arms/Hands: None (83)  Permission Sought/Granted Permission sought to share information with : Facility Art therapist granted to share information with : Yes, Verbal Permission Granted  Share Information with NAME: Facility representative            Emotional Assessment Appearance:: (Spoke with patients sonRush Landmark) Attitude/Demeanor/Rapport: Engaged, Reactive Affect (typically observed): Accepting, Appropriate, Calm, Pleasant Orientation: : (Spoke with son via phone )   Psych Involvement:  No (comment)  Admission diagnosis:  Injury of  head, initial encounter [S09.90XA] Fall, initial encounter [W19.XXXA] Urinary tract infection without hematuria, site unspecified [N39.0] Patient Active Problem List   Diagnosis Date Noted  . COVID-19 virus infection 03/18/2019  . UTI (urinary tract infection) 03/18/2019  . Unwitnessed fall 03/18/2019  . Sinus tachycardia 03/18/2019  . AKI (acute kidney injury) ([place]) 03/18/2019  . CAP (community acquired pneumonia) 11/08/2015  . Pneumonia 11/08/2015  . Dermatophytosis of nail   . Bladder cancer ([place]) 10/25/2012   PCP:  Josetta Huddle, MD Pharmacy:  No Pharmacies Listed    Social Determinants of Health (SDOH) Interventions  NA  Readmission Risk Interventions No flowsheet data found.

## 2019-03-20 NOTE — Progress Notes (Signed)
Physical Therapy Treatment Patient Details Name: Zachary Obrien MRN: [id number] DOB: 1921/06/17 Today's Date: 03/20/2019    History of Present Illness Pt adm after unwitnessed fall at ALF. Pt found to have UTI and is COVID +. PMH - bladder CA, TURP, TKR, BPH    PT Comments    OT and  Patient having difficulty standing erect enough form the stedy. Assisted standing and transfer to recliner. Patient appears much weaker and requires increased assistance today.  Follow Up Recommendations  SNF;Supervision/Assistance - 24 hour     Equipment Recommendations  None recommended by PT    Recommendations for Other Services       Precautions / Restrictions Precautions Precautions: Fall Precaution Comments: painful L knee    Mobility  Bed Mobility Overal bed mobility: Needs Assistance Bed Mobility: Sit to Supine     Supine to sit: Max assist;+2 for physical assistance     General bed mobility comments: oob in STEDY with OT  Transfers Overall transfer level: Needs assistance   Transfers: Sit to/from Stand Sit to Stand: Max assist;+2 physical assistance         General transfer comment: assisted to stand in STEDY and then transfer to recliner, max of 2  to stand and then sit down to recliner.  Ambulation/Gait                 Stairs             Wheelchair Mobility    Modified Rankin (Stroke Patients Only)       Balance Overall balance assessment: History of Falls   Sitting balance-Leahy Scale: Fair       Standing balance-Leahy Scale: Poor                              Cognition Arousal/Alertness: Awake/alert Behavior During Therapy: WFL for tasks assessed/performed Overall Cognitive Status: No family/caregiver present to determine baseline cognitive functioning Area of Impairment: Memory;Safety/judgement;Awareness;Problem solving                 Orientation Level: Disoriented to;Time   Memory: Decreased short-term memory   Safety/Judgement: Decreased awareness of deficits Awareness: Emergent Problem Solving: Slow processing;Difficulty sequencing General Comments: Performance affected by [facility] and being legally blind      Exercises      General Comments        Pertinent Vitals/Pain Pain Assessment: Faces Faces Pain Scale: Hurts even more Pain Location: L knee Pain Descriptors / Indicators: Grimacing;Guarding;Aching;Sore Pain Intervention(s): Limited activity within patient's tolerance;Monitored during session;Ice applied    Home Living                      Prior Function            PT Goals (current goals can now be found in the care plan section) Acute Rehab PT Goals Patient Stated Goal: to get stronger and be able to do more for himself Progress towards PT goals: Progressing toward goals    Frequency    Min 3X/week      PT Plan Current plan remains appropriate    Co-evaluation PT/OT/SLP Co-Evaluation/Treatment: Yes Reason for Co-Treatment: Complexity of the patient's impairments (multi-system involvement) PT goals addressed during session: Mobility/safety with mobility OT goals addressed during session: ADL's and self-care      AM-PAC PT "6 Clicks" Mobility   Outcome Measure  Help needed turning from your back to your side while  in a flat bed without using bedrails?: A Lot Help needed moving from lying on your back to sitting on the side of a flat bed without using bedrails?: A Lot Help needed moving to and from a bed to a chair (including a wheelchair)?: Total Help needed standing up from a chair using your arms (e.g., wheelchair or bedside chair)?: Total Help needed to walk in hospital room?: Total Help needed climbing 3-5 steps with a railing? : Total 6 Click Score: 8    End of Session   Activity Tolerance: Patient limited by fatigue;Patient limited by pain Patient left: in chair;with call bell/phone within reach Nurse Communication: Mobility status;Need for  lift equipment PT Visit Diagnosis: Unsteadiness on feet (R26.81);Other abnormalities of gait and mobility (R26.89);History of falling (Z91.81);Muscle weakness (generalized) (M62.81)     Time: 0950-1000 PT Time Calculation (min) (ACUTE ONLY): 10 min  Charges:  $Therapeutic Activity: 8-22 mins                     Tresa Endo PT Acute Rehabilitation Services Pager [phone] Office [phone]    Claretha Cooper 03/20/2019, 3:37 PM

## 2019-03-20 NOTE — Progress Notes (Signed)
Pt arrived to room 130 via [facility].  Placed on monitor.  VSS and documented. No acute distress noted.

## 2019-03-21 LAB — BASIC METABOLIC PANEL
Anion gap: 9 (ref 5–15)
BUN: 20 mg/dL (ref 8–23)
CO2: 23 mmol/L (ref 22–32)
Calcium: 8.5 mg/dL — ABNORMAL LOW (ref 8.9–10.3)
Chloride: 102 mmol/L (ref 98–111)
Creatinine, Ser: 1.06 mg/dL (ref 0.61–1.24)
GFR calc Af Amer: >60 mL/min (ref >60)
GFR calc non Af Amer: 59 mL/min — ABNORMAL LOW (ref >60)
Glucose, Bld: 150 mg/dL — ABNORMAL HIGH (ref 70–99)
Potassium: 3.3 mmol/L — ABNORMAL LOW (ref 3.5–5.1)
Sodium: 134 mmol/L — ABNORMAL LOW (ref 135–145)

## 2019-03-21 LAB — C-REACTIVE PROTEIN: CRP: 14.4 mg/dL — ABNORMAL HIGH (ref <1.0)

## 2019-03-21 LAB — CBC
HCT: 34.1 % — ABNORMAL LOW (ref 39.0–52.0)
Hemoglobin: 11.3 g/dL — ABNORMAL LOW (ref 13.0–17.0)
MCH: 32.6 pg (ref 26.0–34.0)
MCHC: 33.1 g/dL (ref 30.0–36.0)
MCV: 98.3 fL (ref 80.0–100.0)
Platelets: 174 10*3/uL (ref 150–400)
RBC: 3.47 MIL/uL — ABNORMAL LOW (ref 4.22–5.81)
RDW: 13 % (ref 11.5–15.5)
WBC: 9.7 10*3/uL (ref 4.0–10.5)
nRBC: 0 % (ref 0.0–0.2)

## 2019-03-21 LAB — D-DIMER, QUANTITATIVE: D-Dimer, Quant: 2.94 ug/mL-FEU — ABNORMAL HIGH (ref 0.00–0.50)

## 2019-03-21 MED ORDER — LIDOCAINE HCL 1 % IJ SOLN
5.0000 mL | Freq: Once | INTRAMUSCULAR | Status: DC
  Filled 2019-03-21: qty 5

## 2019-03-21 NOTE — Progress Notes (Signed)
[facility] Department and AHF Team  ReDS Research Project   Patient #: [id number]  ReDS Measurement  Right: 35 %  Left: 31 %

## 2019-03-21 NOTE — Progress Notes (Signed)
Patient has bed at [facility] in [facility], they are able to accept him on Friday.   Plan for dc Friday to [place], son [name] Zachary Obrien) updated and in agreement.   [place], [place]

## 2019-03-21 NOTE — Progress Notes (Signed)
PROGRESS NOTE  Zachary Obrien  KAJ:[id number] DOB: [birth year] DOA: 03/18/2019 PCP: Josetta Huddle, MD   Brief Narrative: Zachary Obrien a 85 y.o.malewith medical history significant ofbladder cancer, BPH, and conditions listed below presenting to the hospital via EMS from his nursing home for evaluation after an unwitnessed fall. Staff reported to EMS that patient was only down for a few seconds. He is not on blood thinners. He was noted to have a laceration to his right lower leg and a hematoma of his head. EMS reported deformity to the chest wall but patient stated this was from a fall several years ago and he has no chest injury or complaints. States he was standing up and then fell. Does not know what exactly happened. Denies any dizziness/lightheadedness, chest pain, or shortness of breath at that time. He is not sure if he lost consciousness. Denies any fevers, chills, cough, nausea, vomiting, abdominal pain, diarrhea, dysuria, orurinary frequency/urgency. No other complaints.Complaining of pain in his right shin region.   Subjective: Still complains of left knee pain, [place] dyspnea, chest pain, lightheadedness or dizziness .  Assessment & Plan: Principal Problem:   COVID-19 virus infection Active Problems:   UTI (urinary tract infection)   Unwitnessed fall   Sinus tachycardia   AKI (acute kidney injury) ([place])  Covid-19 infection: Largely asymptomatic. - Continue supportive measures/isolation/monitoring.  - Follow inflammatory markers.  Unwitnessed fall: With amnesia for the event, given age there is suspicion for cardiogenic syncope.  - Continue telemetry, telemetry was reviewed, no evidence of malignant arrhythmias or pauses or profound bradycardia - One covid negative, should get echocardiogram.  - PT/OT evaluations ongoing  Left leg/knee pain: Following fall.  -Chest x-ray with no signs of fracture, but has significant effusion, likely traumatic left knee  effusion, possibly will need this fluid drained, I have discussed with his son Richardson Landry via phone, and he is agreeable for arthrocentesis if indicated. - Ice, lidocaine patch. Tylenol prn. Avoid NSAIDs.  Sinus tachycardia:  - Improving. TSH 1.622. No evidence of bleeding, suspect due to infection and hypovolemia which has improved.  Elevated troponin: Suspect due to demand ischemia in the absence of chest pain or STE on ECG.  Elevated d-dimer: Due to covid most likely, and is elevated out of proportion of other inflammatory markers. No PE on CTA and no DVT on LE U/S. - Intermediate dose heparin 7,500 u q8h.  E. coli UTI:  - Continue ceftriaxone , will continue with IV Rocephin today, hopefully can be transitioned in 1 to 2 days oral regimen  AKI: Improving slightly from 1.46 > 1.31, baseline ~1.  - Avoid nephrotoxins - Monitor BMP  BPH:  - Continue finasteride, not retaining at this time.   Right lung base subpleural nodule: ~74mm.  - Consider repeat imaging after resolution of acute issues.  DVT prophylaxis: Heparin Code Status: DNR Family Communication: Discussed with son via phone Disposition Plan: SNF when bed available, hopefully tomorrow  Consultants:   None  Procedures:   None  Antimicrobials:  Ceftriaxone 5/10 >>     Objective: Vitals:   03/20/19 2300 03/21/19 0000 03/21/19 0505 03/21/19 0758  BP:  (!) 152/83 (!) 145/87   Pulse: 94 (!) 102 (!) 108   Resp: (!) 30 (!) 28 (!) 22   Temp:  99.3 F (37.4 C) (!) 97.2 F (36.2 C) 98.2 F (36.8 C)  TempSrc:  Oral Oral Oral  SpO2: 97% 96% 97%   Weight:      Height:  Intake/Output Summary (Last 24 hours) at 03/21/2019 1151 Last data filed at 03/21/2019 0843 Gross per 24 hour  Intake 100.06 ml  Output 725 ml  Net -624.94 ml   Filed Weights   03/18/19 2332 03/20/19 0000  Weight: 77.7 kg 79.3 kg   Awake Alert, Oriented X 3, No new F.N deficits, Normal affect, frail, thin Symmetrical Chest wall  movement, Good air movement bilaterally, CTAB RRR,No Gallops,Rubs or new Murmurs, No Parasternal Heave +ve B.Sounds, Abd Soft, No tenderness, No rebound - guarding or rigidity. No Cyanosis, Clubbing or edema, patient with a skin tear in left lower extremity, and right knee swelling/joint effusion, but no erythema redness.  Data Reviewed: I have personally reviewed following labs and imaging studies  CBC: Recent Labs  Lab 03/18/19 1736 03/18/19 2234 03/19/19 1141  WBC 5.3  --  7.4  NEUTROABS  --  5.0  --   HGB 11.9*  --  9.9*  HCT 37.0*  --  28.7*  MCV 98.9  --  94.1  PLT 151  --  976   Basic Metabolic Panel: Recent Labs  Lab 03/18/19 1736 03/19/19 1934  NA 137 136  K 4.1 3.8  CL 105 105  CO2 22 22  GLUCOSE 118* 137*  BUN 35* 24*  CREATININE 1.46* 1.31*  CALCIUM 9.2 8.5*   GFR: Estimated Creatinine Clearance: 34.8 mL/min (A) (by C-G formula based on SCr of 1.31 mg/dL (H)). Liver Function Tests: Recent Labs  Lab 03/19/19 1934  AST 20  ALT 13  ALKPHOS 30*  BILITOT 0.8  PROT 6.6  ALBUMIN 3.0*   No results for input(s): LIPASE, AMYLASE in the last 168 hours. No results for input(s): AMMONIA in the last 168 hours. Coagulation Profile: No results for input(s): INR, PROTIME in the last 168 hours. Cardiac Enzymes: Recent Labs  Lab 03/18/19 2234 03/19/19 0419 03/19/19 1141  TROPONINI 0.09* 0.15* 0.14*   BNP (last 3 results) No results for input(s): PROBNP in the last 8760 hours. HbA1C: No results for input(s): HGBA1C in the last 72 hours. CBG: No results for input(s): GLUCAP in the last 168 hours. Lipid Profile: No results for input(s): CHOL, HDL, LDLCALC, TRIG, CHOLHDL, LDLDIRECT in the last 72 hours. Thyroid Function Tests: Recent Labs    03/18/19 2234  TSH 1.622  FREET4 0.92   Anemia Panel: Recent Labs    03/18/19 2234  FERRITIN 1,052*   Urine analysis:    Component Value Date/Time   COLORURINE YELLOW 03/18/2019 1736   APPEARANCEUR CLOUDY  (A) 03/18/2019 1736   LABSPEC 1.014 03/18/2019 1736   PHURINE 5.0 03/18/2019 1736   GLUCOSEU NEGATIVE 03/18/2019 1736   HGBUR SMALL (A) 03/18/2019 1736   BILIRUBINUR NEGATIVE 03/18/2019 1736   KETONESUR NEGATIVE 03/18/2019 1736   PROTEINUR NEGATIVE 03/18/2019 1736   UROBILINOGEN 0.2 01/10/2009 1100   NITRITE NEGATIVE 03/18/2019 1736   LEUKOCYTESUR LARGE (A) 03/18/2019 1736   Recent Results (from the past 240 hour(s))  SARS Coronavirus 2 (CEPHEID - Performed in [place] hospital lab), Hosp Order     Status: Abnormal   Collection Time: 03/18/19  5:36 PM  Result Value Ref Range Status   SARS Coronavirus 2 POSITIVE (A) NEGATIVE Final    Comment: CRITICAL RESULT CALLED TO, READ BACK BY AND VERIFIED WITH: RN SANDS, M [id number]  FCP (NOTE) If result is NEGATIVE SARS-CoV-2 target nucleic acids are NOT DETECTED. The SARS-CoV-2 RNA is generally detectable in upper and lower  respiratory specimens during the acute phase of  infection. The lowest  concentration of SARS-CoV-2 viral copies this assay can detect is 250  copies / mL. A negative result does not preclude SARS-CoV-2 infection  and should not be used as the sole basis for treatment or other  patient management decisions.  A negative result may occur with  improper specimen collection / handling, submission of specimen other  than nasopharyngeal swab, presence of viral mutation(s) within the  areas targeted by this assay, and inadequate number of viral copies  (<250 copies / mL). A negative result must be combined with clinical  observations, patient history, and epidemiological information. If result is POSITIVE SARS-CoV-2 target nucleic acids are DETECTED.  The SARS-CoV-2 RNA is generally detectable in upper and lower  respiratory specimens during the acute phase of infection.  Positive  results are indicative of active infection with SARS-CoV-2.  Clinical  correlation with patient history and other diagnostic information is   necessary to determine patient infection status.  Positive results do  not rule out bacterial infection or co-infection with other viruses. If result is PRESUMPTIVE POSTIVE SARS-CoV-2 nucleic acids MAY BE PRESENT.   A presumptive positive result was obtained on the submitted specimen  and confirmed on repeat testing.  While 2019 novel coronavirus  (SARS-CoV-2) nucleic acids may be present in the submitted sample  additional confirmatory testing may be necessary for epidemiological  and / or clinical management purposes  to differentiate between  SARS-CoV-2 and other Sarbecovirus currently known to infect humans.  If clinically indicated additional testing with an alternate test  methodology [phone])  is advised. The SARS-CoV-2 RNA is generally  detectable in upper and lower respiratory specimens during the acute  phase of infection. The expected result is Negative. Fact Sheet for Patients:  StrictlyIdeas.no Fact Sheet for Healthcare Providers: BankingDealers.co.za This test is not yet approved or cleared by the [place] FDA and has been authorized for detection and/or diagnosis of SARS-CoV-2 by FDA under an Emergency Use Authorization (EUA).  This EUA will remain in effect (meaning this test can be used) for the duration of the COVID-19 declaration under Section 564(b)(1) of the Act, 21 U.S.C. section 360bbb-3(b)(1), unless the authorization is terminated or revoked sooner. Performed at [facility], [place] [address] [place], [place] [address]   Urine Culture     Status: Abnormal   Collection Time: 03/18/19  5:37 PM  Result Value Ref Range Status   Specimen Description URINE, CLEAN CATCH  Final   Special Requests   Final    NONE Performed at [facility], [place] [address], [place], [place] [address]    Culture >=100,000 COLONIES/mL ESCHERICHIA COLI (A)  Final   Report Status 03/20/2019 FINAL  Final   Organism ID,  Bacteria ESCHERICHIA COLI (A)  Final      Susceptibility   Escherichia coli - MIC*    AMPICILLIN <=2 SENSITIVE Sensitive     CEFAZOLIN <=4 SENSITIVE Sensitive     CEFTRIAXONE <=1 SENSITIVE Sensitive     CIPROFLOXACIN <=0.25 SENSITIVE Sensitive     GENTAMICIN 2 SENSITIVE Sensitive     IMIPENEM <=0.25 SENSITIVE Sensitive     NITROFURANTOIN <=16 SENSITIVE Sensitive     TRIMETH/SULFA >=320 RESISTANT Resistant     AMPICILLIN/SULBACTAM <=2 SENSITIVE Sensitive     PIP/TAZO <=4 SENSITIVE Sensitive     Extended ESBL NEGATIVE Sensitive     * >=100,000 COLONIES/mL ESCHERICHIA COLI      Radiology Studies: Dg Knee Ap/lat W/sunrise Left  Result Date:  03/20/2019 CLINICAL DATA:  Fall knee pain EXAM: LEFT KNEE 3 VIEWS COMPARISON:  None. FINDINGS: Negative for fracture Advanced joint space narrowing and spurring laterally. Widening of the medial joint space. Patellofemoral mild degenerative change. Moderate joint effusion. Mild arterial calcification IMPRESSION: Negative for fracture Electronically Signed   By: Franchot Gallo M.D.   On: 03/20/2019 14:54    Scheduled Meds: . cholecalciferol  1,000 Units Oral Daily  . ferrous sulfate  325 mg Oral BID WC  . finasteride  5 mg Oral Daily  . heparin injection (subcutaneous)  7,500 Units Subcutaneous Q8H  . lidocaine  1 patch Transdermal Q24H  . lidocaine (PF)  5 mL Other Once  . vitamin B-12  500 mcg Oral Daily  . vitamin C  500 mg Oral BID  . zinc sulfate  220 mg Oral Daily   Continuous Infusions: . cefTRIAXone (ROCEPHIN)  IV 1 g (03/21/19 1039)     LOS: 2 days   Time spent: 25 minutes.  Phillips Climes, MD [facility] [email or web address] Password [facility] 03/21/2019, 11:51 AM

## 2019-03-21 NOTE — Progress Notes (Signed)
Spoke with patient's son and gave him updates on patient. Answered all questions and concerns regarding patient. Told him to call with further questions.

## 2019-03-22 DIAGNOSIS — N399 Disorder of urinary system, unspecified: Secondary | ICD-10-CM | POA: Diagnosis not present

## 2019-03-22 DIAGNOSIS — M6281 Muscle weakness (generalized): Secondary | ICD-10-CM | POA: Diagnosis not present

## 2019-03-22 DIAGNOSIS — C679 Malignant neoplasm of bladder, unspecified: Secondary | ICD-10-CM | POA: Diagnosis not present

## 2019-03-22 DIAGNOSIS — R41841 Cognitive communication deficit: Secondary | ICD-10-CM | POA: Diagnosis not present

## 2019-03-22 DIAGNOSIS — U071 COVID-19: Secondary | ICD-10-CM | POA: Diagnosis not present

## 2019-03-22 DIAGNOSIS — W19XXXD Unspecified fall, subsequent encounter: Secondary | ICD-10-CM

## 2019-03-22 DIAGNOSIS — R0902 Hypoxemia: Secondary | ICD-10-CM | POA: Diagnosis not present

## 2019-03-22 DIAGNOSIS — R2242 Localized swelling, mass and lump, left lower limb: Secondary | ICD-10-CM

## 2019-03-22 DIAGNOSIS — J189 Pneumonia, unspecified organism: Secondary | ICD-10-CM | POA: Diagnosis not present

## 2019-03-22 DIAGNOSIS — N390 Urinary tract infection, site not specified: Secondary | ICD-10-CM | POA: Diagnosis not present

## 2019-03-22 DIAGNOSIS — R000 Tachycardia, unspecified: Secondary | ICD-10-CM | POA: Diagnosis not present

## 2019-03-22 DIAGNOSIS — Z7401 Bed confinement status: Secondary | ICD-10-CM | POA: Diagnosis not present

## 2019-03-22 DIAGNOSIS — W19XXXA Unspecified fall, initial encounter: Secondary | ICD-10-CM | POA: Diagnosis not present

## 2019-03-22 DIAGNOSIS — S0990XD Unspecified injury of head, subsequent encounter: Secondary | ICD-10-CM | POA: Diagnosis not present

## 2019-03-22 DIAGNOSIS — M2550 Pain in unspecified joint: Secondary | ICD-10-CM | POA: Diagnosis not present

## 2019-03-22 DIAGNOSIS — N179 Acute kidney failure, unspecified: Secondary | ICD-10-CM | POA: Diagnosis not present

## 2019-03-22 DIAGNOSIS — M25562 Pain in left knee: Secondary | ICD-10-CM

## 2019-03-22 DIAGNOSIS — Z23 Encounter for immunization: Secondary | ICD-10-CM | POA: Diagnosis present

## 2019-03-22 DIAGNOSIS — R319 Hematuria, unspecified: Secondary | ICD-10-CM | POA: Diagnosis not present

## 2019-03-22 DIAGNOSIS — R1312 Dysphagia, oropharyngeal phase: Secondary | ICD-10-CM | POA: Diagnosis not present

## 2019-03-22 LAB — GRAM STAIN

## 2019-03-22 LAB — CBC
HCT: 32.4 % — ABNORMAL LOW (ref 39.0–52.0)
Hemoglobin: 10.4 g/dL — ABNORMAL LOW (ref 13.0–17.0)
MCH: 31.8 pg (ref 26.0–34.0)
MCHC: 32.1 g/dL (ref 30.0–36.0)
MCV: 99.1 fL (ref 80.0–100.0)
Platelets: 160 10*3/uL (ref 150–400)
RBC: 3.27 MIL/uL — ABNORMAL LOW (ref 4.22–5.81)
RDW: 12.9 % (ref 11.5–15.5)
WBC: 10.4 10*3/uL (ref 4.0–10.5)
nRBC: 0 % (ref 0.0–0.2)

## 2019-03-22 LAB — BASIC METABOLIC PANEL
Anion gap: 10 (ref 5–15)
BUN: 20 mg/dL (ref 8–23)
CO2: 22 mmol/L (ref 22–32)
Calcium: 8.4 mg/dL — ABNORMAL LOW (ref 8.9–10.3)
Chloride: 105 mmol/L (ref 98–111)
Creatinine, Ser: 1.06 mg/dL (ref 0.61–1.24)
GFR calc Af Amer: >60 mL/min (ref >60)
GFR calc non Af Amer: 59 mL/min — ABNORMAL LOW (ref >60)
Glucose, Bld: 116 mg/dL — ABNORMAL HIGH (ref 70–99)
Potassium: 3.8 mmol/L (ref 3.5–5.1)
Sodium: 137 mmol/L (ref 135–145)

## 2019-03-22 LAB — SYNOVIAL CELL COUNT + DIFF, W/ CRYSTALS
Crystals, Fluid: NONE SEEN
Lymphocytes-Synovial Fld: 1 % (ref 0–20)
Monocyte-Macrophage-Synovial Fluid: 24 % — ABNORMAL LOW (ref 50–90)
Neutrophil, Synovial: 75 % — ABNORMAL HIGH (ref 0–25)
WBC, Synovial: 533 /mm3 — ABNORMAL HIGH (ref 0–200)

## 2019-03-22 LAB — C-REACTIVE PROTEIN: CRP: 19 mg/dL — ABNORMAL HIGH (ref <1.0)

## 2019-03-22 LAB — D-DIMER, QUANTITATIVE: D-Dimer, Quant: 3.43 ug/mL-FEU — ABNORMAL HIGH (ref 0.00–0.50)

## 2019-03-22 MED ORDER — CEPHALEXIN 500 MG PO CAPS: 500.0000 mg | ORAL_CAPSULE | Freq: Three times a day (TID) | ORAL | 0 refills | Status: AC

## 2019-03-22 MED ORDER — ACETAMINOPHEN 325 MG PO TABS: 650.0000 mg | ORAL_TABLET | Freq: Four times a day (QID) | ORAL | Status: AC | PRN

## 2019-03-22 MED ORDER — ZINC SULFATE 220 (50 ZN) MG PO CAPS: 220.0000 mg | ORAL_CAPSULE | Freq: Every day | ORAL | Status: DC

## 2019-03-22 MED ORDER — ASCORBIC ACID 500 MG PO TABS: 500.0000 mg | ORAL_TABLET | Freq: Two times a day (BID) | ORAL | Status: DC

## 2019-03-22 NOTE — Progress Notes (Signed)
Card[facility] Department and AHF Team  ReDS Research Project   Patient #: [id number]  ReDS Measurement  Right: 31 %  Left: 31 %

## 2019-03-22 NOTE — Progress Notes (Signed)
Occupational Therapy Treatment Patient Details Name: Zachary Obrien MRN: [id number] DOB: [birth year] Today's Date: 03/22/2019    History of present illness Pt adm after unwitnessed fall at ALF. Pt found to have UTI and is COVID +. PMH - bladder CA, TURP, TKR, BPH   OT comments  Making slow progress toward goals due to L knee pain. Will need to manage positioning L knee to prevent flexion knee contracture. Will need to continue with OT at SNF  Follow Up Recommendations  SNF;Supervision/Assistance - 24 hour    Equipment Recommendations  None recommended by OT    Recommendations for Other Services      Precautions / Restrictions Precautions Precautions: Fall Precaution Comments: painful L knee       Mobility Bed Mobility Overal bed mobility: Needs Assistance Bed Mobility: Rolling Rolling: Max assist;+2 for physical assistance            Transfers                      Balance                                           ADL either performed or assessed with clinical judgement   ADL       Grooming: Minimal assistance;Sitting           Upper Body Dressing : Moderate assistance;Sitting   Lower Body Dressing: Maximal assistance;Bed level               Functional mobility during ADLs: Maximal assistance       Vision       Perception     Praxis      Cognition Arousal/Alertness: Awake/alert Behavior During Therapy: Anxious Overall Cognitive Status: History of cognitive impairments - at baseline                       Memory: Decreased short-term memory   Safety/Judgement: Decreased awareness of deficits Awareness: Emergent Problem Solving: Slow processing;Difficulty sequencing General Comments: Performance affected by [facility] and being legally blind        Exercises     Shoulder Instructions       General Comments      Pertinent Vitals/ Pain       Pain Assessment: Faces Faces Pain Scale: Hurts even  more Pain Location: L knee Pain Descriptors / Indicators: Grimacing;Guarding;Aching;Sore Pain Intervention(s): Limited activity within patient's tolerance  Home Living                                          Prior Functioning/Environment              Frequency  Min 3X/week        Progress Toward Goals  OT Goals(current goals can now be found in the care plan section)  Progress towards OT goals: Progressing toward goals  Acute Rehab OT Goals Patient Stated Goal: to get stronger and be able to do more for himself OT Goal Formulation: With patient Time For Goal Achievement: 04/03/19 Potential to Achieve Goals: Good ADL Goals Pt Will Perform Grooming: with set-up;sitting Pt Will Perform Upper Body Bathing: with set-up;sitting Pt Will Perform Lower Body Bathing: with mod assist;sit to/from stand;with adaptive equipment Pt Will Transfer to  Toilet: with mod assist;bedside commode;stand pivot transfer Pt/caregiver will Perform Home Exercise Program: With Supervision;Increased strength;Both right and left upper extremity  Plan Discharge plan remains appropriate    Co-evaluation                 AM-PAC OT "6 Clicks" Daily Activity     Outcome Measure   Help from another person eating meals?: None Help from another person taking care of personal grooming?: A Little Help from another person toileting, which includes using toliet, bedpan, or urinal?: A Lot Help from another person bathing (including washing, rinsing, drying)?: A Lot Help from another person to put on and taking off regular upper body clothing?: A Lot Help from another person to put on and taking off regular lower body clothing?: A Lot 6 Click Score: 15    End of Session    OT Visit Diagnosis: Unsteadiness on feet (R26.81);Other abnormalities of gait and mobility (R26.89);Muscle weakness (generalized) (M62.81);History of falling (Z91.81);Pain Pain - Right/Left: Left Pain - part of  body: Knee   Activity Tolerance Patient tolerated treatment well   Patient Left in bed;with call bell/phone within reach;with bed alarm set   Nurse Communication Mobility status        Time: 1450-1515 OT Time Calculation (min): 25 min  Charges: OT General Charges $OT Visit: 1 Visit OT Treatments $Self Care/Home Management : 23-37 mins  Maurie Boettcher, OT/L   Acute OT Clinical Specialist [place] Pager [phone] Office [phone]    [facility] 03/22/2019, 4:50 PM

## 2019-03-22 NOTE — Progress Notes (Signed)
[facility] P[facility] Procedure Note: arthrocentesis left knee  Indication: pain and swelling after possible fall  History of present illness: Admitted for complications of coronavirus, he tells me that he had a fall prior to admission he thinks.  He knows that he was found on the floor and he has been having pain in his left knee ever since.  On physical exam he shows significant swelling in the left suprapatellar bursa.    The patient was informed of the risks and benefits and his son gave verbal consent to Dr. Waldron Labs.  Sterile procedures were used, including gown gloves and sterile gloves.  Chlorhexidine was used to prep the lateral aspect of the left knee.  Timeout was performed.  1% lidocaine was used for anesthesia over the suprapatellar bursa as well as over the synovial cavity.  30 cc of bloody synovial fluid was aspirated from the left knee.  A second needle and syringe were used to aspirate 27 cc of clear serous appearing fluid from the lateral suprapatellar bursa.  Findings discussed with Dr. Waldron Labs.  No complications.  Labs have been ordered by the primary team.  Roselie Awkward, MD [facility] Pager: [phone] Cell: [phone] If no response, call [phone]

## 2019-03-22 NOTE — TOC Transition Note (Signed)
Transition of Care [facility]) - CM/SW Discharge Note   Patient Details  Name: Zachary Obrien MRN: [id number] Date of Birth: 12-01-1920  [facility] of [facility]) CM/SW Contact:  Alberteen Sam, LCSW Phone Number: 03/22/2019, 2:40 PM   Clinical Narrative:     Patient will DC to: [facility] Anticipated DC date: 03/22/2019 Family notified: Gwyndolyn Saxon Transport by: Corey Harold  Per MD patient ready for DC to [place] . RN, patient, patient's family, and facility notified of DC. Discharge Summary sent to facility. RN given number for report [phone] ask for Santiago Glad. DC packet on chart. Ambulance transport requested for patient.  CSW signing off.  [place], [place]   Final next level of care: Skilled Nursing Facility Barriers to Discharge: No Barriers Identified   Patient Goals and CMS Choice Patient states their goals for this hospitalization and ongoing recovery are:: to go to rehab then home CMS Medicare.gov Compare Post Acute Care list provided to:: Patient Represenative (must comment)(William) Choice offered to / list presented to : Adult Children  Discharge Placement PASRR number recieved: 03/20/19            Patient [facility] at: [facility] [place] Patient to be transferred to facility by: [place] Name of family member notified: Nicolette Bang, son) Patient and family notified of of transfer: 03/22/19  Discharge Plan and Services In-house Referral: Clinical Social Work Discharge Planning Services: NA Post Acute Care Choice: [facility]          DME Arranged: N/A DME Agency: NA       HH Arranged: NA HH Agency: NA        Social Determinants of Health (SDOH) Interventions     Readmission Risk Interventions No flowsheet data found.

## 2019-03-22 NOTE — Discharge Summary (Addendum)
Zachary Obrien, is a 85 y.o. male  DOB [birth year]  MRN [id number].  Admission date:  03/18/2019  Admitting Physician  Shela Leff, MD  Discharge Date:  03/22/2019   Primary MD  Josetta Huddle, MD  Recommendations for primary care physician for things to follow:  -Check CBC, BMP in 3 to 5 days -Follow-up final results on arterial of left of fluid done 03/22/2019 -Please encourage to use active spirometry -Weight bearing as tolerated left knee, will need orthopedic appointment as an outpatient if no improvement -Continue with daily dressing for right lower skin laceration.  Code Status : DNR (confirmed by son)   Admission Diagnosis  Injury of head, initial encounter [S09.90XA] Fall, initial encounter [W19.XXXA] Urinary tract infection without hematuria, site unspecified [N39.0]   Discharge Diagnosis  Injury of head, initial encounter [S09.90XA] Fall, initial encounter [W19.XXXA] Urinary tract infection without hematuria, site unspecified [N39.0]    Principal Problem:   COVID-19 virus infection Active Problems:   UTI (urinary tract infection)   Fall   Sinus tachycardia   AKI (acute kidney injury) ([place])      Past Medical History:  Diagnosis Date   Bladder cancer ([place])    Bladder neoplasm    BPH (benign prostatic hyperplasia)    Dermatophytosis of nail    No pertinent past medical history    Osteoarthritis     Past Surgical History:  Procedure Laterality Date   bilatera lens replacement sfor cataracts  age 58   CYSTOSCOPY  09/13/2012   Procedure: CYSTOSCOPY;  Surgeon: Molli Hazard, MD;  Location: [facility];  Service: Urology;  Laterality: N/A;   CYSTOSCOPY WITH BIOPSY  10/25/2012   Procedure: CYSTOSCOPY WITH BIOPSY;  Surgeon: Molli Hazard, MD;  Location: [facility];  Service: Urology;  Laterality: N/A;   detached retina surgery left eye  age 60   EYE SURGERY   age 72   partial cornea tranplants   hydrocele surgery  age 80   JOINT REPLACEMENT  2010   right knee replacement   TONSILLECTOMY  age 22   TRANSURETHRAL RESECTION OF BLADDER TUMOR  09/13/2012   Procedure: TRANSURETHRAL RESECTION OF BLADDER TUMOR (TURBT);  Surgeon: Molli Hazard, MD;  Location: [facility];  Service: Urology;  Laterality: N/A;  fulguration of bladder tumor       History of present illness and  Hospital Course:     Kindly see H&P for history of present illness and admission details, please review complete Labs, Consult reports and Test reports for all details in brief  HPI  from the history and physical done on the day of admission 03/28/2019  HPI: Zachary Obrien is a 85 y.o. male with medical history significant of bladder cancer, BPH, and conditions listed below presenting to the hospital via EMS from his nursing home for evaluation after an unwitnessed fall.  Staff reported to EMS that patient was only down for a few seconds.  He is not on blood thinners.  He was noted to have a laceration  to his right lower leg and a hematoma of his head.  EMS reported deformity to the chest wall but patient stated this was from a fall several years ago and he has no chest injury or complaints.  States he was standing up and then fell.  Does not know what exactly happened.  Denies any dizziness/lightheadedness, chest pain, or shortness of breath at that time.  He is not sure if he lost consciousness.  Denies any fevers, chills, cough, nausea, vomiting, abdominal pain, diarrhea, dysuria, or urinary frequency/urgency.  No other complaints.  Complaining of pain in his right shin region.  ED Course: Vital signs stable on arrival.  No leukocytosis.  Hemoglobin 11.9, at baseline.  BUN 35.  Creatinine 1.4, baseline 1.0.  COVID-19 rapid test positive.  UA with large amount of leukocytes, greater than 50 WBCs, and few bacteria.  Urine culture pending.  Chest x-ray showing no acute  cardiopulmonary abnormality or acute traumatic injury.  X-ray of the right tibia/fibula showing no acute fracture or dislocation and intact right knee hardware.  Head CT showing soft tissue hematoma of the left frontal parietal scalp and no acute intracranial pathology.  CT C-spine showing no fracture or static subluxation. Patient received ceftriaxone and 1 L IV fluid bolus.  Later became tachycardic with heart rate in the 130s to 140s, regular rhythm but no visible P waves.  He was given another 1 L fluid bolus and will be given Cardizem 10 mg IV.   Hospital Course   Covid-19 infection: Largely asymptomatic. - Continue supportive measures/isolation/monitoring.  - Is saturating 100%, still having low temperatures, continue with Tylenol as needed  Unwitnessed fall:  -With amnesia for the event, given age there is suspicion for cardiogenic syncope.  -  telemetry was reviewed, no evidence of malignant arrhythmias or pauses or profound bradycardia - One covid negative, should get echocardiogram.  - PT/OT evaluations ongoing.  Left leg/knee pain: Following fall.  -Chest x-ray with no signs of fracture, but has significant effusion, likely traumatic left knee effusion, had arthrocentesis done today, hemorrhagic fluid drained from the knee, some clear-colored fluid drained from the bursa,, joint does not appear to be infected, erythema or warmth no indication for antibiotics. -Weightbearing as tolerated. -if no improvements orthopedic follow-up patient. - Tylenol prn. Avoid NSAIDs.  Sinus tachycardia:  - Improving. TSH 1.622. No evidence of bleeding, suspect due to infection and hypovolemia which has improved.  Elevated troponin: Suspect due to demand ischemia in the absence of chest pain or STE on ECG.  Elevated d-dimer: Due to covid most likely, and is elevated out of proportion of other inflammatory markers. No PE on CTA and no DVT on LE U/S.  E. coli UTI:  -Treated with Rocephin  during hospital stay, received a total of 4 days, will finish another 6 days of Keflex as outpatient.  AKI: Improving slightly from 1.46 > 1.31>1.06,  baseline ~1.  - Avoid nephrotoxins  BPH:  - Continue finasteride, not retaining at this time.   Right lung base subpleural nodule: ~56mm.  - Consider repeat imaging after resolution of acute issues.   Discharge Condition:  Stable  Follow UP  Contact information for after-discharge care    Destination    [facility] .   Service:  Skilled Nursing Contact information: [address] [phone]                Discharge Instructions  and  Discharge Medications  Discharge Instructions    Discharge instructions   Complete by:  As directed    Follow with Primary MD Josetta Huddle, MD or SNF physician  Get CBC, CMP,  checked  by Primary MD next visit.    Activity: As tolerated with Full fall precautions use walker/cane & assistance as needed   Disposition SNF   Diet: Heart Healthy  , with feeding assistance and aspiration precautions.  For Heart failure patients - Check your Weight same time everyday, if you gain over 2 pounds, or you develop in leg swelling, experience more shortness of breath or chest pain, call your Primary MD immediately. Follow Cardiac Low Salt Diet and 1.5 lit/day fluid restriction.   On your next visit with your primary care physician please Get Medicines reviewed and adjusted.   Please request your Prim.MD to go over all Hospital Tests and Procedure/Radiological results at the follow up, please get all Hospital records sent to your Prim MD by signing hospital release before you go home.   If you experience worsening of your admission symptoms, develop shortness of breath, life threatening emergency, suicidal or homicidal thoughts you must seek medical attention immediately by calling 911 or calling your MD immediately  if symptoms less  severe.  You Must read complete instructions/literature along with all the possible adverse reactions/side effects for all the Medicines you take and that have been prescribed to you. Take any new Medicines after you have completely understood and accpet all the possible adverse reactions/side effects.   Do not drive, operating heavy machinery, perform activities at heights, swimming or participation in water activities or provide baby sitting services if your were admitted for syncope or siezures until you have seen by Primary MD or a Neurologist and advised to do so again.  Do not drive when taking Pain medications.    Do not take more than prescribed Pain, Sleep and Anxiety Medications  Special Instructions: If you have smoked or chewed Tobacco  in the last 2 yrs please stop smoking, stop any regular Alcohol  and or any Recreational drug use.  Wear Seat belts while driving.   Please note  You were cared for by a hospitalist during your hospital stay. If you have any questions about your discharge medications or the care you received while you were in the hospital after you are discharged, you can call the unit and asked to speak with the hospitalist on call if the hospitalist that took care of you is not available. Once you are discharged, your primary care physician will handle any further medical issues. Please note that NO REFILLS for any discharge medications will be authorized once you are discharged, as it is imperative that you return to your primary care physician (or establish a relationship with a primary care physician if you do not have one) for your aftercare needs so that they can reassess your need for medications and monitor your lab values.   Increase activity slowly   Complete by:  As directed      Allergies as of 03/22/2019   No Known Allergies     Medication List    STOP taking these medications   ibuprofen 200 MG tablet Commonly known as:  ADVIL     TAKE these  medications   acetaminophen 325 MG tablet Commonly known as:  TYLENOL Take 2 tablets (650 mg total) by mouth every 6 (six) hours as needed for mild pain, fever or headache (fever >/= 101).   ascorbic acid 500  MG tablet Commonly known as:  VITAMIN C Take 1 tablet (500 mg total) by mouth 2 (two) times daily. Please take for 10 days   aspirin 81 MG chewable tablet Chew 81 mg by mouth daily.   cephALEXin 500 MG capsule Commonly known as:  KEFLEX Take 1 capsule (500 mg total) by mouth 3 (three) times daily for 6 days. Please take for 6 days total.   cholecalciferol 25 MCG (1000 UT) tablet Commonly known as:  VITAMIN D Take 1,000 Units by mouth daily.   docusate sodium 100 MG capsule Commonly known as:  COLACE Take 100 mg by mouth daily as needed for mild constipation.   ferrous sulfate 325 (65 FE) MG tablet Take 325 mg by mouth 2 (two) times a day.   finasteride 5 MG tablet Commonly known as:  PROSCAR Take 5 mg by mouth daily.   vitamin B-12 500 MCG tablet Commonly known as:  CYANOCOBALAMIN Take 500 mcg by mouth daily.   zinc sulfate 220 (50 Zn) MG capsule Take 1 capsule (220 mg total) by mouth daily. Please take for 10 days. Start taking on:  Mar 23, 2019         Diet and Activity recommendation: See Discharge Instructions above   Consults obtained -  None   Major procedures and Radiology Reports - PLEASE review detailed and final reports for all details, in brief -   Left knee arthrocentesis   Dg Tibia/fibula Right  Result Date: 03/18/2019 CLINICAL DATA:  85 year old male status post unwitnessed fall. Pain. EXAM: RIGHT TIBIA AND FIBULA - 2 VIEW COMPARISON:  Right knee series 08/31/2004. FINDINGS: Interval total right knee arthroplasty. Hardware appears intact and normally aligned. No joint effusion is evident on the lateral view. Calcified peripheral vascular disease appears increased. Alignment at the right ankle is maintained. No acute osseous abnormality  identified. IMPRESSION: 1. No acute fracture or dislocation identified about the right tib-fib. 2. Right knee hardware appears intact. 3. Calcified peripheral vascular disease. Electronically Signed   By: Genevie Ann M.D.   On: 03/18/2019 18:26   Ct Head Wo Contrast  Result Date: 03/18/2019 CLINICAL DATA:  Trauma EXAM: CT HEAD WITHOUT CONTRAST CT CERVICAL SPINE WITHOUT CONTRAST TECHNIQUE: Multidetector CT imaging of the head and cervical spine was performed following the standard protocol without intravenous contrast. Multiplanar CT image reconstructions of the cervical spine were also generated. COMPARISON:  05/01/2018 FINDINGS: CT HEAD FINDINGS Brain: No evidence of acute infarction, hemorrhage, hydrocephalus, extra-axial collection or mass lesion/mass effect. Periventricular white matter hypodensity. Vascular: No hyperdense vessel or unexpected calcification. Skull: Normal. Negative for fracture or focal lesion. Sinuses/Orbits: No acute finding. Other: Soft tissue hematoma of the left frontoparietal scalp. CT CERVICAL SPINE FINDINGS Alignment: Normal. Skull base and vertebrae: No acute fracture. No primary bone lesion or focal pathologic process. Soft tissues and spinal canal: No prevertebral fluid or swelling. No visible canal hematoma. Disc levels: Moderate multilevel disc and facet degenerative disease. Upper chest: Negative. Other: None. IMPRESSION: 1.  No acute intracranial pathology. 2.  Small-vessel white matter disease. 3.  Soft tissue hematoma of the left frontoparietal scalp. 4.  No fracture or static subluxation of the cervical spine. Electronically Signed   By: Eddie Candle M.D.   On: 03/18/2019 18:32   Ct Angio Chest Pe W Or Wo Contrast  Result Date: 03/19/2019 CLINICAL DATA:  85 year old male with concern for pulmonary embolism. EXAM: CT ANGIOGRAPHY CHEST WITH CONTRAST TECHNIQUE: Multidetector CT imaging of the chest was performed using the  standard protocol during bolus administration of  intravenous contrast. Multiplanar CT image reconstructions and MIPs were obtained to evaluate the vascular anatomy. CONTRAST:  61mL OMNIPAQUE IOHEXOL 350 MG/ML SOLN COMPARISON:  Chest radiograph dated 03/18/2019 FINDINGS: Cardiovascular: There is mild cardiomegaly. No pericardial effusion. Coronary vascular calcification primarily involving the LAD. There is moderate atherosclerotic calcification of the thoracic aorta. Evaluation of the pulmonary arteries is limited due to respiratory motion artifact and suboptimal visualization of the peripheral branches. No definite pulmonary artery embolus identified. Mediastinum/Nodes: There is no hilar or mediastinal adenopathy. The esophagus is grossly unremarkable. No mediastinal fluid collection. Lungs/Pleura: There are bibasilar subsegmental atelectasis. No focal consolidation, pleural effusion, or pneumothorax. A 15 mm subpleural nodular density at the right lung base (series 7, image 67) may represent an area of atelectasis or scarring. Attention on follow-up imaging recommended. The central airways are patent. Upper Abdomen: Partially visualized stone within the gallbladder. The visualized upper abdomen is otherwise unremarkable. Musculoskeletal: Degenerative changes of the spine. Anterior dislocation of the left clavicular head at the sternoclavicular junction, likely chronic. No acute osseous pathology. Review of the MIP images confirms the above findings. IMPRESSION: 1. No CT evidence of pulmonary embolism. 2. Bibasilar subsegmental atelectasis. A 15 mm subpleural nodular density at the right lung base may represent an area of atelectasis or scarring. Attention to this area on follow-up imaging recommended. Electronically Signed   By: Anner Crete M.D.   On: 03/19/2019 03:42   Ct Cervical Spine Wo Contrast  Result Date: 03/18/2019 CLINICAL DATA:  Trauma EXAM: CT HEAD WITHOUT CONTRAST CT CERVICAL SPINE WITHOUT CONTRAST TECHNIQUE: Multidetector CT imaging of the  head and cervical spine was performed following the standard protocol without intravenous contrast. Multiplanar CT image reconstructions of the cervical spine were also generated. COMPARISON:  05/01/2018 FINDINGS: CT HEAD FINDINGS Brain: No evidence of acute infarction, hemorrhage, hydrocephalus, extra-axial collection or mass lesion/mass effect. Periventricular white matter hypodensity. Vascular: No hyperdense vessel or unexpected calcification. Skull: Normal. Negative for fracture or focal lesion. Sinuses/Orbits: No acute finding. Other: Soft tissue hematoma of the left frontoparietal scalp. CT CERVICAL SPINE FINDINGS Alignment: Normal. Skull base and vertebrae: No acute fracture. No primary bone lesion or focal pathologic process. Soft tissues and spinal canal: No prevertebral fluid or swelling. No visible canal hematoma. Disc levels: Moderate multilevel disc and facet degenerative disease. Upper chest: Negative. Other: None. IMPRESSION: 1.  No acute intracranial pathology. 2.  Small-vessel white matter disease. 3.  Soft tissue hematoma of the left frontoparietal scalp. 4.  No fracture or static subluxation of the cervical spine. Electronically Signed   By: Eddie Candle M.D.   On: 03/18/2019 18:32   Dg Chest Port 1 View  Result Date: 03/18/2019 CLINICAL DATA:  85 year old male status post unwitnessed fall. Chest wall pain and possible clavicle deformity. EXAM: PORTABLE CHEST 1 VIEW COMPARISON:  Chest radiographs 11/08/2015 and earlier. FINDINGS: Portable AP semi upright view at 1745 hours. Stable cardiac size at the upper limits of normal to mildly enlarged. Mild tortuosity of the aorta. Other mediastinal contours are within normal limits. Visualized tracheal air column is within normal limits. Lower lung volumes. Allowing for portable technique the lungs are clear. No pneumothorax. No clavicle fracture is evident. And asymmetric widening of the left AC joint appears chronic and stable since 2012. No  displaced rib fracture identified. No acute osseous abnormality identified. IMPRESSION: No acute cardiopulmonary abnormality or acute traumatic injury identified. Electronically Signed   By: Genevie Ann M.D.   On:  03/18/2019 18:25   Vas [place] Lower Extremity Venous (dvt)  Result Date: 03/20/2019  Lower Venous Study Indications: Positive d dimer.  Performing Technologist: Abram Sander RVS  Examination Guidelines: A complete evaluation includes B-mode imaging, spectral Doppler, color Doppler, and power Doppler as needed of all accessible portions of each vessel. Bilateral testing is considered an integral part of a complete examination. Limited examinations for reoccurring indications may be performed as noted.  +---------+---------------+---------+-----------+----------+--------------+  RIGHT     Compressibility Phasicity Spontaneity Properties Summary         +---------+---------------+---------+-----------+----------+--------------+  CFV       Full            Yes       Yes                                    +---------+---------------+---------+-----------+----------+--------------+  SFJ       Full                                                             +---------+---------------+---------+-----------+----------+--------------+  FV Prox   Full                                                             +---------+---------------+---------+-----------+----------+--------------+  FV Mid    Full                                                             +---------+---------------+---------+-----------+----------+--------------+  FV Distal Full                                                             +---------+---------------+---------+-----------+----------+--------------+  PFV       Full                                                             +---------+---------------+---------+-----------+----------+--------------+  POP       Full            Yes       Yes                                     +---------+---------------+---------+-----------+----------+--------------+  PTV       Full                                                             +---------+---------------+---------+-----------+----------+--------------+  PERO                                                       Not visualized  +---------+---------------+---------+-----------+----------+--------------+   +---------+---------------+---------+-----------+----------+--------------+  LEFT      Compressibility Phasicity Spontaneity Properties Summary         +---------+---------------+---------+-----------+----------+--------------+  CFV       Full            Yes       Yes                                    +---------+---------------+---------+-----------+----------+--------------+  SFJ       Full                                                             +---------+---------------+---------+-----------+----------+--------------+  FV Prox   Full                                                             +---------+---------------+---------+-----------+----------+--------------+  FV Mid    Full                                                             +---------+---------------+---------+-----------+----------+--------------+  FV Distal Full                                                             +---------+---------------+---------+-----------+----------+--------------+  PFV       Full                                                             +---------+---------------+---------+-----------+----------+--------------+  POP       Full            Yes       Yes                                    +---------+---------------+---------+-----------+----------+--------------+  PTV       Full                                                             +---------+---------------+---------+-----------+----------+--------------+  PERO                                                       Not visualized   +---------+---------------+---------+-----------+----------+--------------+     Summary: Right: There is no evidence of deep vein thrombosis in the lower extremity. No cystic structure found in the popliteal fossa. Left: There is no evidence of deep vein thrombosis in the lower extremity. No cystic structure found in the popliteal fossa.  *See table(s) above for measurements and observations. Electronically signed by Ruta Hinds MD on 03/20/2019 at 9:43:14 AM.    Final    Dg Knee Ap/lat W/sunrise Left  Result Date: 03/20/2019 CLINICAL DATA:  Fall knee pain EXAM: LEFT KNEE 3 VIEWS COMPARISON:  None. FINDINGS: Negative for fracture Advanced joint space narrowing and spurring laterally. Widening of the medial joint space. Patellofemoral mild degenerative change. Moderate joint effusion. Mild arterial calcification IMPRESSION: Negative for fracture Electronically Signed   By: Franchot Gallo M.D.   On: 03/20/2019 14:54    Micro Results    Recent Results (from the past 240 hour(s))  SARS Coronavirus 2 (CEPHEID - Performed in [place] hospital lab), Hosp Order     Status: Abnormal   Collection Time: 03/18/19  5:36 PM  Result Value Ref Range Status   SARS Coronavirus 2 POSITIVE (A) NEGATIVE Final    Comment: CRITICAL RESULT CALLED TO, READ BACK BY AND VERIFIED WITH: RN SANDS, [id number]  FCP (NOTE) If result is NEGATIVE SARS-CoV-2 target nucleic acids are NOT DETECTED. The SARS-CoV-2 RNA is generally detectable in upper and lower  respiratory specimens during the acute phase of infection. The lowest  concentration of SARS-CoV-2 viral copies this assay can detect is 250  copies / mL. A negative result does not preclude SARS-CoV-2 infection  and should not be used as the sole basis for treatment or other  patient management decisions.  A negative result may occur with  improper specimen collection / handling, submission of specimen other  than nasopharyngeal swab, presence of viral  mutation(s) within the  areas targeted by this assay, and inadequate number of viral copies  (<250 copies / mL). A negative result must be combined with clinical  observations, patient history, and epidemiological information. If result is POSITIVE SARS-CoV-2 target nucleic acids are DETECTED.  The SARS-CoV-2 RNA is generally detectable in upper and lower  respiratory specimens during the acute phase of infection.  Positive  results are indicative of active infection with SARS-CoV-2.  Clinical  correlation with patient history and other diagnostic information is  necessary to determine patient infection status.  Positive results do  not rule out bacterial infection or co-infection with other viruses. If result is PRESUMPTIVE POSTIVE SARS-CoV-2 nucleic acids MAY BE PRESENT.   A presumptive positive result was obtained on the submitted specimen  and confirmed on repeat testing.  While 2019 novel coronavirus  (SARS-CoV-2) nucleic acids may be present in the submitted sample  additional confirmatory testing may be necessary for epidemiological  and / or clinical management purposes  to differentiate between  SARS-CoV-2 and other Sarbecovirus currently known to infect humans.  If clinically indicated additional testing with an alternate test  methodology [phone])  is advised. The SARS-CoV-2 RNA is generally  detectable in upper and lower respiratory specimens during the acute  phase of  infection. The expected result is Negative. Fact Sheet for Patients:  StrictlyIdeas.no Fact Sheet for Healthcare Providers: BankingDealers.co.za This test is not yet approved or cleared by the [place] FDA and has been authorized for detection and/or diagnosis of SARS-CoV-2 by FDA under an Emergency Use Authorization (EUA).  This EUA will remain in effect (meaning this test can be used) for the duration of the COVID-19 declaration under Section 564(b)(1)  of the Act, 21 U.S.C. section 360bbb-3(b)(1), unless the authorization is terminated or revoked sooner. Performed at [facility], [place] [address] [place], [place] [address]   Urine Culture     Status: Abnormal   Collection Time: 03/18/19  5:37 PM  Result Value Ref Range Status   Specimen Description URINE, CLEAN CATCH  Final   Special Requests   Final    NONE Performed at [facility], [place] [address] [place], [place] [address]    Culture >=100,000 COLONIES/mL ESCHERICHIA COLI (A)  Final   Report Status 03/20/2019 FINAL  Final   Organism ID, Bacteria ESCHERICHIA COLI (A)  Final      Susceptibility   Escherichia coli - MIC*    AMPICILLIN <=2 SENSITIVE Sensitive     CEFAZOLIN <=4 SENSITIVE Sensitive     CEFTRIAXONE <=1 SENSITIVE Sensitive     CIPROFLOXACIN <=0.25 SENSITIVE Sensitive     GENTAMICIN 2 SENSITIVE Sensitive     IMIPENEM <=0.25 SENSITIVE Sensitive     NITROFURANTOIN <=16 SENSITIVE Sensitive     TRIMETH/SULFA >=320 RESISTANT Resistant     AMPICILLIN/SULBACTAM <=2 SENSITIVE Sensitive     PIP/TAZO <=4 SENSITIVE Sensitive     Extended ESBL NEGATIVE Sensitive     * >=100,000 COLONIES/mL ESCHERICHIA COLI       Today   Subjective:   Zachary Obrien today has no headache,no chest abdominal pain, reports some left knee pain  Objective:   Blood pressure 139/77, pulse (!) 105, temperature 100 F (37.8 C), temperature source Oral, resp. rate (!) 21, height 5\' 10"  (1.778 m), weight 79.3 kg, SpO2 99 %.   Intake/Output Summary (Last 24 hours) at 03/22/2019 1218 Last data filed at 03/22/2019 0900 Gross per 24 hour  Intake 240 ml  Output 500 ml  Net -260 ml    Exam Awake Alert, Oriented x 3, frail, deconditioned, in no apparent distress  Symmetrical Chest wall movement, Good air movement bilaterally, CTAB RRR,No Gallops,Rubs or new Murmurs, No Parasternal Heave +ve B.Sounds, Abd Soft, Non tender,  No rebound -guarding or rigidity. No Cyanosis, Clubbing  or edema, and has left knee swelling, no erythema ,skin laceration in the right bandaged  Data Review   CBC w Diff:  Lab Results  Component Value Date   WBC 10.4 03/22/2019   HGB 10.4 (L) 03/22/2019   HCT 32.4 (L) 03/22/2019   PLT 160 03/22/2019   LYMPHOPCT 10 03/18/2019   MONOPCT 11 03/18/2019   EOSPCT 0 03/18/2019   BASOPCT 0 03/18/2019    CMP:  Lab Results  Component Value Date   NA 137 03/22/2019   K 3.8 03/22/2019   CL 105 03/22/2019   CO2 22 03/22/2019   BUN 20 03/22/2019   CREATININE 1.06 03/22/2019   PROT 6.6 03/19/2019   ALBUMIN 3.0 (L) 03/19/2019   BILITOT 0.8 03/19/2019   ALKPHOS 30 (L) 03/19/2019   AST 20 03/19/2019   ALT 13 03/19/2019  .   Total Time in preparing paper work, data evaluation and todays exam - 35 minutes  Zachary Obrien  Zachary Obrien M.D on 03/22/2019 at 12:18 PM  [facility]   Office  [phone]

## 2019-03-22 NOTE — Progress Notes (Signed)
Called [facility] and gave report to Chanetta Marshall RN. [facility] is here to transport patient.

## 2019-03-22 NOTE — Discharge Instructions (Signed)
Person Under Monitoring Name: Zachary Obrien  Location: [address] [place] [address]   Infection Prevention Recommendations for Individuals Confirmed to have, or Being Evaluated for, 2019 Novel Coronavirus (COVID-19) Infection Who Receive Care at Home  Individuals who are confirmed to have, or are being evaluated for, COVID-19 should follow the prevention steps below until a healthcare provider or local or state health department says they can return to normal activities.  Stay home except to get medical care You should restrict activities outside your home, except for getting medical care. Do not go to work, school, or public areas, and do not use public transportation or taxis.  Call ahead before visiting your doctor Before your medical appointment, call the healthcare provider and tell them that you have, or are being evaluated for, COVID-19 infection. This will help the healthcare providers office take steps to keep other people from getting infected. Ask your healthcare provider to call the local or state health department.  Monitor your symptoms Seek prompt medical attention if your illness is worsening (e.g., difficulty breathing). Before going to your medical appointment, call the healthcare provider and tell them that you have, or are being evaluated for, COVID-19 infection. Ask your healthcare provider to call the local or state health department.  Wear a facemask You should wear a facemask that covers your nose and mouth when you are in the same room with other people and when you visit a healthcare provider. People who live with or visit you should also wear a facemask while they are in the same room with you.  Separate yourself from other people in your home As much as possible, you should stay in a different room from other people in your home. Also, you should use a separate bathroom, if available.  Avoid sharing household items You should  not share dishes, drinking glasses, cups, eating utensils, towels, bedding, or other items with other people in your home. After using these items, you should wash them thoroughly with soap and water.  Cover your coughs and sneezes Cover your mouth and nose with a tissue when you cough or sneeze, or you can cough or sneeze into your sleeve. Throw used tissues in a lined trash can, and immediately wash your hands with soap and water for at least 20 seconds or use an alcohol-based hand rub.  Wash your [facility] your hands often and thoroughly with soap and water for at least 20 seconds. You can use an alcohol-based hand sanitizer if soap and water are not available and if your hands are not visibly dirty. Avoid touching your eyes, nose, and mouth with unwashed hands.   Prevention Steps for Caregivers and Household Members of Individuals Confirmed to have, or Being Evaluated for, COVID-19 Infection Being Cared for in the Home  If you live with, or provide care at home for, a person confirmed to have, or being evaluated for, COVID-19 infection please follow these guidelines to prevent infection:  Follow healthcare providers instructions Make sure that you understand and can help the patient follow any healthcare provider instructions for all care.  Provide for the patients basic needs You should help the patient with basic needs in the home and provide support for getting groceries, prescriptions, and other personal needs.  Monitor the patients symptoms If they are getting sicker, call his or her medical provider and tell them that the patient has, or is being evaluated for, COVID-19 infection. This will help the healthcare  providers office take steps to keep other people from getting infected. Ask the healthcare provider to call the local or state health department.  Limit the number of people who have contact with the patient  If possible, have only one caregiver for the  patient.  Other household members should stay in another home or place of residence. If this is not possible, they should stay  in another room, or be separated from the patient as much as possible. Use a separate bathroom, if available.  Restrict visitors who do not have an essential need to be in the home.  Keep older adults, very young children, and other sick people away from the patient Keep older adults, very young children, and those who have compromised immune systems or chronic health conditions away from the patient. This includes people with chronic heart, lung, or kidney conditions, diabetes, and cancer.  Ensure good ventilation Make sure that shared spaces in the home have good air flow, such as from an air conditioner or an opened window, weather permitting.  Wash your hands often  Wash your hands often and thoroughly with soap and water for at least 20 seconds. You can use an alcohol based hand sanitizer if soap and water are not available and if your hands are not visibly dirty.  Avoid touching your eyes, nose, and mouth with unwashed hands.  Use disposable paper towels to dry your hands. If not available, use dedicated cloth towels and replace them when they become wet.  Wear a facemask and gloves  Wear a disposable facemask at all times in the room and gloves when you touch or have contact with the patients blood, body fluids, and/or secretions or excretions, such as sweat, saliva, sputum, nasal mucus, vomit, urine, or feces.  Ensure the mask fits over your nose and mouth tightly, and do not touch it during use.  Throw out disposable facemasks and gloves after using them. Do not reuse.  Wash your hands immediately after removing your facemask and gloves.  If your personal clothing becomes contaminated, carefully remove clothing and launder. Wash your hands after handling contaminated clothing.  Place all used disposable facemasks, gloves, and other waste in a lined  container before disposing them with other household waste.  Remove gloves and wash your hands immediately after handling these items.  Do not share dishes, glasses, or other household items with the patient  Avoid sharing household items. You should not share dishes, drinking glasses, cups, eating utensils, towels, bedding, or other items with a patient who is confirmed to have, or being evaluated for, COVID-19 infection.  After the person uses these items, you should wash them thoroughly with soap and water.  Wash laundry thoroughly  Immediately remove and wash clothes or bedding that have blood, body fluids, and/or secretions or excretions, such as sweat, saliva, sputum, nasal mucus, vomit, urine, or feces, on them.  Wear gloves when handling laundry from the patient.  Read and follow directions on labels of laundry or clothing items and detergent. In general, wash and dry with the warmest temperatures recommended on the label.  Clean all areas the individual has used often  Clean all touchable surfaces, such as counters, tabletops, doorknobs, bathroom fixtures, toilets, phones, keyboards, tablets, and bedside tables, every day. Also, clean any surfaces that may have blood, body fluids, and/or secretions or excretions on them.  Wear gloves when cleaning surfaces the patient has come in contact with.  Use a diluted bleach solution (e.g., dilute bleach with  1 part bleach and 10 parts water) or a household disinfectant with a label that says EPA-registered for coronaviruses. To make a bleach solution at home, add 1 tablespoon of bleach to 1 quart (4 cups) of water. For a larger supply, add  cup of bleach to 1 gallon (16 cups) of water.  Read labels of cleaning products and follow recommendations provided on product labels. Labels contain instructions for safe and effective use of the cleaning product including precautions you should take when applying the product, such as wearing gloves or  eye protection and making sure you have good ventilation during use of the product.  Remove gloves and wash hands immediately after cleaning.  Monitor yourself for signs and symptoms of illness Caregivers and household members are considered close contacts, should monitor their health, and will be asked to limit movement outside of the home to the extent possible. Follow the monitoring steps for close contacts listed on the symptom monitoring form.   ? If you have additional questions, contact your local health department or call the epidemiologist on call at [phone] (available 24/7). ? This guidance is subject to change. For the most up-to-date guidance from [facility], please refer to their website: YouBlogs.pl

## 2019-03-27 LAB — CULTURE, BODY FLUID W GRAM STAIN -BOTTLE
Culture: NO GROWTH
Culture: NO GROWTH

## 2019-04-06 DIAGNOSIS — S0990XD Unspecified injury of head, subsequent encounter: Secondary | ICD-10-CM | POA: Diagnosis not present

## 2019-04-06 DIAGNOSIS — N390 Urinary tract infection, site not specified: Secondary | ICD-10-CM | POA: Diagnosis not present

## 2019-04-06 DIAGNOSIS — M25562 Pain in left knee: Secondary | ICD-10-CM | POA: Diagnosis not present

## 2019-04-06 DIAGNOSIS — N179 Acute kidney failure, unspecified: Secondary | ICD-10-CM | POA: Diagnosis not present

## 2019-04-27 DIAGNOSIS — R278 Other lack of coordination: Secondary | ICD-10-CM | POA: Diagnosis not present

## 2019-04-27 DIAGNOSIS — R1311 Dysphagia, oral phase: Secondary | ICD-10-CM | POA: Diagnosis not present

## 2019-04-27 DIAGNOSIS — M25562 Pain in left knee: Secondary | ICD-10-CM | POA: Diagnosis not present

## 2019-04-27 DIAGNOSIS — R000 Tachycardia, unspecified: Secondary | ICD-10-CM | POA: Diagnosis not present

## 2019-04-27 DIAGNOSIS — C679 Malignant neoplasm of bladder, unspecified: Secondary | ICD-10-CM | POA: Diagnosis not present

## 2019-04-27 DIAGNOSIS — M1990 Unspecified osteoarthritis, unspecified site: Secondary | ICD-10-CM | POA: Diagnosis not present

## 2019-04-27 DIAGNOSIS — I10 Essential (primary) hypertension: Secondary | ICD-10-CM | POA: Diagnosis not present

## 2019-04-27 DIAGNOSIS — N401 Enlarged prostate with lower urinary tract symptoms: Secondary | ICD-10-CM | POA: Diagnosis not present

## 2019-04-27 DIAGNOSIS — R2681 Unsteadiness on feet: Secondary | ICD-10-CM | POA: Diagnosis not present

## 2019-04-27 DIAGNOSIS — N179 Acute kidney failure, unspecified: Secondary | ICD-10-CM | POA: Diagnosis not present

## 2019-04-27 DIAGNOSIS — D509 Iron deficiency anemia, unspecified: Secondary | ICD-10-CM | POA: Diagnosis not present

## 2019-04-27 DIAGNOSIS — R41841 Cognitive communication deficit: Secondary | ICD-10-CM | POA: Diagnosis not present

## 2019-04-30 DIAGNOSIS — M25562 Pain in left knee: Secondary | ICD-10-CM | POA: Diagnosis not present

## 2019-04-30 DIAGNOSIS — R278 Other lack of coordination: Secondary | ICD-10-CM | POA: Diagnosis not present

## 2019-04-30 DIAGNOSIS — C679 Malignant neoplasm of bladder, unspecified: Secondary | ICD-10-CM | POA: Diagnosis not present

## 2019-04-30 DIAGNOSIS — L89620 Pressure ulcer of left heel, unstageable: Secondary | ICD-10-CM | POA: Diagnosis not present

## 2019-04-30 DIAGNOSIS — M1990 Unspecified osteoarthritis, unspecified site: Secondary | ICD-10-CM | POA: Diagnosis not present

## 2019-04-30 DIAGNOSIS — R000 Tachycardia, unspecified: Secondary | ICD-10-CM | POA: Diagnosis not present

## 2019-04-30 DIAGNOSIS — D509 Iron deficiency anemia, unspecified: Secondary | ICD-10-CM | POA: Diagnosis not present

## 2019-04-30 DIAGNOSIS — R296 Repeated falls: Secondary | ICD-10-CM | POA: Diagnosis not present

## 2019-04-30 DIAGNOSIS — U071 COVID-19: Secondary | ICD-10-CM | POA: Diagnosis not present

## 2019-04-30 DIAGNOSIS — N390 Urinary tract infection, site not specified: Secondary | ICD-10-CM | POA: Diagnosis not present

## 2019-04-30 DIAGNOSIS — R2681 Unsteadiness on feet: Secondary | ICD-10-CM | POA: Diagnosis not present

## 2019-04-30 DIAGNOSIS — N401 Enlarged prostate with lower urinary tract symptoms: Secondary | ICD-10-CM | POA: Diagnosis not present

## 2019-04-30 DIAGNOSIS — N179 Acute kidney failure, unspecified: Secondary | ICD-10-CM | POA: Diagnosis not present

## 2019-04-30 DIAGNOSIS — R1311 Dysphagia, oral phase: Secondary | ICD-10-CM | POA: Diagnosis not present

## 2019-04-30 DIAGNOSIS — I10 Essential (primary) hypertension: Secondary | ICD-10-CM | POA: Diagnosis not present

## 2019-04-30 DIAGNOSIS — R41841 Cognitive communication deficit: Secondary | ICD-10-CM | POA: Diagnosis not present

## 2019-05-01 DIAGNOSIS — N179 Acute kidney failure, unspecified: Secondary | ICD-10-CM | POA: Diagnosis not present

## 2019-05-01 DIAGNOSIS — N401 Enlarged prostate with lower urinary tract symptoms: Secondary | ICD-10-CM | POA: Diagnosis not present

## 2019-05-01 DIAGNOSIS — C679 Malignant neoplasm of bladder, unspecified: Secondary | ICD-10-CM | POA: Diagnosis not present

## 2019-05-01 DIAGNOSIS — M25562 Pain in left knee: Secondary | ICD-10-CM | POA: Diagnosis not present

## 2019-05-01 DIAGNOSIS — I10 Essential (primary) hypertension: Secondary | ICD-10-CM | POA: Diagnosis not present

## 2019-05-01 DIAGNOSIS — R278 Other lack of coordination: Secondary | ICD-10-CM | POA: Diagnosis not present

## 2019-05-01 DIAGNOSIS — R41841 Cognitive communication deficit: Secondary | ICD-10-CM | POA: Diagnosis not present

## 2019-05-01 DIAGNOSIS — D509 Iron deficiency anemia, unspecified: Secondary | ICD-10-CM | POA: Diagnosis not present

## 2019-05-01 DIAGNOSIS — R000 Tachycardia, unspecified: Secondary | ICD-10-CM | POA: Diagnosis not present

## 2019-05-01 DIAGNOSIS — M1990 Unspecified osteoarthritis, unspecified site: Secondary | ICD-10-CM | POA: Diagnosis not present

## 2019-05-01 DIAGNOSIS — R1311 Dysphagia, oral phase: Secondary | ICD-10-CM | POA: Diagnosis not present

## 2019-05-01 DIAGNOSIS — R2681 Unsteadiness on feet: Secondary | ICD-10-CM | POA: Diagnosis not present

## 2019-05-02 DIAGNOSIS — D509 Iron deficiency anemia, unspecified: Secondary | ICD-10-CM | POA: Diagnosis not present

## 2019-05-02 DIAGNOSIS — R000 Tachycardia, unspecified: Secondary | ICD-10-CM | POA: Diagnosis not present

## 2019-05-02 DIAGNOSIS — C679 Malignant neoplasm of bladder, unspecified: Secondary | ICD-10-CM | POA: Diagnosis not present

## 2019-05-02 DIAGNOSIS — R278 Other lack of coordination: Secondary | ICD-10-CM | POA: Diagnosis not present

## 2019-05-02 DIAGNOSIS — N179 Acute kidney failure, unspecified: Secondary | ICD-10-CM | POA: Diagnosis not present

## 2019-05-02 DIAGNOSIS — M25562 Pain in left knee: Secondary | ICD-10-CM | POA: Diagnosis not present

## 2019-05-02 DIAGNOSIS — N401 Enlarged prostate with lower urinary tract symptoms: Secondary | ICD-10-CM | POA: Diagnosis not present

## 2019-05-02 DIAGNOSIS — R41841 Cognitive communication deficit: Secondary | ICD-10-CM | POA: Diagnosis not present

## 2019-05-02 DIAGNOSIS — R2681 Unsteadiness on feet: Secondary | ICD-10-CM | POA: Diagnosis not present

## 2019-05-02 DIAGNOSIS — I10 Essential (primary) hypertension: Secondary | ICD-10-CM | POA: Diagnosis not present

## 2019-05-02 DIAGNOSIS — M1990 Unspecified osteoarthritis, unspecified site: Secondary | ICD-10-CM | POA: Diagnosis not present

## 2019-05-02 DIAGNOSIS — R1311 Dysphagia, oral phase: Secondary | ICD-10-CM | POA: Diagnosis not present

## 2019-05-03 DIAGNOSIS — I10 Essential (primary) hypertension: Secondary | ICD-10-CM | POA: Diagnosis not present

## 2019-05-03 DIAGNOSIS — R2681 Unsteadiness on feet: Secondary | ICD-10-CM | POA: Diagnosis not present

## 2019-05-03 DIAGNOSIS — M25562 Pain in left knee: Secondary | ICD-10-CM | POA: Diagnosis not present

## 2019-05-03 DIAGNOSIS — R1311 Dysphagia, oral phase: Secondary | ICD-10-CM | POA: Diagnosis not present

## 2019-05-03 DIAGNOSIS — C679 Malignant neoplasm of bladder, unspecified: Secondary | ICD-10-CM | POA: Diagnosis not present

## 2019-05-03 DIAGNOSIS — R000 Tachycardia, unspecified: Secondary | ICD-10-CM | POA: Diagnosis not present

## 2019-05-03 DIAGNOSIS — N179 Acute kidney failure, unspecified: Secondary | ICD-10-CM | POA: Diagnosis not present

## 2019-05-03 DIAGNOSIS — N401 Enlarged prostate with lower urinary tract symptoms: Secondary | ICD-10-CM | POA: Diagnosis not present

## 2019-05-03 DIAGNOSIS — R41841 Cognitive communication deficit: Secondary | ICD-10-CM | POA: Diagnosis not present

## 2019-05-03 DIAGNOSIS — S81801A Unspecified open wound, right lower leg, initial encounter: Secondary | ICD-10-CM | POA: Diagnosis not present

## 2019-05-03 DIAGNOSIS — R278 Other lack of coordination: Secondary | ICD-10-CM | POA: Diagnosis not present

## 2019-05-03 DIAGNOSIS — D509 Iron deficiency anemia, unspecified: Secondary | ICD-10-CM | POA: Diagnosis not present

## 2019-05-03 DIAGNOSIS — L89623 Pressure ulcer of left heel, stage 3: Secondary | ICD-10-CM | POA: Diagnosis not present

## 2019-05-03 DIAGNOSIS — M1990 Unspecified osteoarthritis, unspecified site: Secondary | ICD-10-CM | POA: Diagnosis not present

## 2019-05-04 DIAGNOSIS — R41841 Cognitive communication deficit: Secondary | ICD-10-CM | POA: Diagnosis not present

## 2019-05-04 DIAGNOSIS — M1990 Unspecified osteoarthritis, unspecified site: Secondary | ICD-10-CM | POA: Diagnosis not present

## 2019-05-04 DIAGNOSIS — R1311 Dysphagia, oral phase: Secondary | ICD-10-CM | POA: Diagnosis not present

## 2019-05-04 DIAGNOSIS — R2681 Unsteadiness on feet: Secondary | ICD-10-CM | POA: Diagnosis not present

## 2019-05-04 DIAGNOSIS — M25562 Pain in left knee: Secondary | ICD-10-CM | POA: Diagnosis not present

## 2019-05-04 DIAGNOSIS — D509 Iron deficiency anemia, unspecified: Secondary | ICD-10-CM | POA: Diagnosis not present

## 2019-05-04 DIAGNOSIS — R000 Tachycardia, unspecified: Secondary | ICD-10-CM | POA: Diagnosis not present

## 2019-05-04 DIAGNOSIS — I10 Essential (primary) hypertension: Secondary | ICD-10-CM | POA: Diagnosis not present

## 2019-05-04 DIAGNOSIS — N179 Acute kidney failure, unspecified: Secondary | ICD-10-CM | POA: Diagnosis not present

## 2019-05-04 DIAGNOSIS — C679 Malignant neoplasm of bladder, unspecified: Secondary | ICD-10-CM | POA: Diagnosis not present

## 2019-05-04 DIAGNOSIS — N401 Enlarged prostate with lower urinary tract symptoms: Secondary | ICD-10-CM | POA: Diagnosis not present

## 2019-05-04 DIAGNOSIS — R278 Other lack of coordination: Secondary | ICD-10-CM | POA: Diagnosis not present

## 2019-05-07 DIAGNOSIS — R278 Other lack of coordination: Secondary | ICD-10-CM | POA: Diagnosis not present

## 2019-05-07 DIAGNOSIS — R2681 Unsteadiness on feet: Secondary | ICD-10-CM | POA: Diagnosis not present

## 2019-05-07 DIAGNOSIS — R296 Repeated falls: Secondary | ICD-10-CM | POA: Diagnosis not present

## 2019-05-07 DIAGNOSIS — R41841 Cognitive communication deficit: Secondary | ICD-10-CM | POA: Diagnosis not present

## 2019-05-07 DIAGNOSIS — C679 Malignant neoplasm of bladder, unspecified: Secondary | ICD-10-CM | POA: Diagnosis not present

## 2019-05-07 DIAGNOSIS — U071 COVID-19: Secondary | ICD-10-CM | POA: Diagnosis not present

## 2019-05-07 DIAGNOSIS — M1990 Unspecified osteoarthritis, unspecified site: Secondary | ICD-10-CM | POA: Diagnosis not present

## 2019-05-07 DIAGNOSIS — R1311 Dysphagia, oral phase: Secondary | ICD-10-CM | POA: Diagnosis not present

## 2019-05-07 DIAGNOSIS — I10 Essential (primary) hypertension: Secondary | ICD-10-CM | POA: Diagnosis not present

## 2019-05-07 DIAGNOSIS — D509 Iron deficiency anemia, unspecified: Secondary | ICD-10-CM | POA: Diagnosis not present

## 2019-05-07 DIAGNOSIS — N401 Enlarged prostate with lower urinary tract symptoms: Secondary | ICD-10-CM | POA: Diagnosis not present

## 2019-05-07 DIAGNOSIS — R000 Tachycardia, unspecified: Secondary | ICD-10-CM | POA: Diagnosis not present

## 2019-05-07 DIAGNOSIS — N179 Acute kidney failure, unspecified: Secondary | ICD-10-CM | POA: Diagnosis not present

## 2019-05-07 DIAGNOSIS — M25562 Pain in left knee: Secondary | ICD-10-CM | POA: Diagnosis not present

## 2019-05-08 DIAGNOSIS — R000 Tachycardia, unspecified: Secondary | ICD-10-CM | POA: Diagnosis not present

## 2019-05-08 DIAGNOSIS — R2681 Unsteadiness on feet: Secondary | ICD-10-CM | POA: Diagnosis not present

## 2019-05-08 DIAGNOSIS — M1990 Unspecified osteoarthritis, unspecified site: Secondary | ICD-10-CM | POA: Diagnosis not present

## 2019-05-08 DIAGNOSIS — R278 Other lack of coordination: Secondary | ICD-10-CM | POA: Diagnosis not present

## 2019-05-08 DIAGNOSIS — R41841 Cognitive communication deficit: Secondary | ICD-10-CM | POA: Diagnosis not present

## 2019-05-08 DIAGNOSIS — D509 Iron deficiency anemia, unspecified: Secondary | ICD-10-CM | POA: Diagnosis not present

## 2019-05-08 DIAGNOSIS — I10 Essential (primary) hypertension: Secondary | ICD-10-CM | POA: Diagnosis not present

## 2019-05-08 DIAGNOSIS — N179 Acute kidney failure, unspecified: Secondary | ICD-10-CM | POA: Diagnosis not present

## 2019-05-08 DIAGNOSIS — R1311 Dysphagia, oral phase: Secondary | ICD-10-CM | POA: Diagnosis not present

## 2019-05-08 DIAGNOSIS — N401 Enlarged prostate with lower urinary tract symptoms: Secondary | ICD-10-CM | POA: Diagnosis not present

## 2019-05-08 DIAGNOSIS — C679 Malignant neoplasm of bladder, unspecified: Secondary | ICD-10-CM | POA: Diagnosis not present

## 2019-05-08 DIAGNOSIS — M25562 Pain in left knee: Secondary | ICD-10-CM | POA: Diagnosis not present

## 2019-05-09 DIAGNOSIS — D509 Iron deficiency anemia, unspecified: Secondary | ICD-10-CM | POA: Diagnosis not present

## 2019-05-09 DIAGNOSIS — R000 Tachycardia, unspecified: Secondary | ICD-10-CM | POA: Diagnosis not present

## 2019-05-09 DIAGNOSIS — N401 Enlarged prostate with lower urinary tract symptoms: Secondary | ICD-10-CM | POA: Diagnosis not present

## 2019-05-09 DIAGNOSIS — I10 Essential (primary) hypertension: Secondary | ICD-10-CM | POA: Diagnosis not present

## 2019-05-09 DIAGNOSIS — C679 Malignant neoplasm of bladder, unspecified: Secondary | ICD-10-CM | POA: Diagnosis not present

## 2019-05-09 DIAGNOSIS — E440 Moderate protein-calorie malnutrition: Secondary | ICD-10-CM | POA: Diagnosis not present

## 2019-05-09 DIAGNOSIS — J189 Pneumonia, unspecified organism: Secondary | ICD-10-CM | POA: Diagnosis not present

## 2019-05-09 DIAGNOSIS — R1311 Dysphagia, oral phase: Secondary | ICD-10-CM | POA: Diagnosis not present

## 2019-05-09 DIAGNOSIS — L97409 Non-pressure chronic ulcer of unspecified heel and midfoot with unspecified severity: Secondary | ICD-10-CM | POA: Diagnosis not present

## 2019-05-09 DIAGNOSIS — R41841 Cognitive communication deficit: Secondary | ICD-10-CM | POA: Diagnosis not present

## 2019-05-09 DIAGNOSIS — F0390 Unspecified dementia without behavioral disturbance: Secondary | ICD-10-CM | POA: Diagnosis not present

## 2019-05-09 DIAGNOSIS — M25562 Pain in left knee: Secondary | ICD-10-CM | POA: Diagnosis not present

## 2019-05-09 DIAGNOSIS — Z20828 Contact with and (suspected) exposure to other viral communicable diseases: Secondary | ICD-10-CM | POA: Diagnosis not present

## 2019-05-09 DIAGNOSIS — R2681 Unsteadiness on feet: Secondary | ICD-10-CM | POA: Diagnosis not present

## 2019-05-09 DIAGNOSIS — R278 Other lack of coordination: Secondary | ICD-10-CM | POA: Diagnosis not present

## 2019-05-09 DIAGNOSIS — N179 Acute kidney failure, unspecified: Secondary | ICD-10-CM | POA: Diagnosis not present

## 2019-05-09 DIAGNOSIS — M1990 Unspecified osteoarthritis, unspecified site: Secondary | ICD-10-CM | POA: Diagnosis not present

## 2019-05-10 DIAGNOSIS — M1990 Unspecified osteoarthritis, unspecified site: Secondary | ICD-10-CM | POA: Diagnosis not present

## 2019-05-10 DIAGNOSIS — L89623 Pressure ulcer of left heel, stage 3: Secondary | ICD-10-CM | POA: Diagnosis not present

## 2019-05-10 DIAGNOSIS — I10 Essential (primary) hypertension: Secondary | ICD-10-CM | POA: Diagnosis not present

## 2019-05-10 DIAGNOSIS — R41841 Cognitive communication deficit: Secondary | ICD-10-CM | POA: Diagnosis not present

## 2019-05-10 DIAGNOSIS — N401 Enlarged prostate with lower urinary tract symptoms: Secondary | ICD-10-CM | POA: Diagnosis not present

## 2019-05-10 DIAGNOSIS — S81801D Unspecified open wound, right lower leg, subsequent encounter: Secondary | ICD-10-CM | POA: Diagnosis not present

## 2019-05-10 DIAGNOSIS — D509 Iron deficiency anemia, unspecified: Secondary | ICD-10-CM | POA: Diagnosis not present

## 2019-05-10 DIAGNOSIS — M25562 Pain in left knee: Secondary | ICD-10-CM | POA: Diagnosis not present

## 2019-05-10 DIAGNOSIS — R1311 Dysphagia, oral phase: Secondary | ICD-10-CM | POA: Diagnosis not present

## 2019-05-10 DIAGNOSIS — R000 Tachycardia, unspecified: Secondary | ICD-10-CM | POA: Diagnosis not present

## 2019-05-10 DIAGNOSIS — C679 Malignant neoplasm of bladder, unspecified: Secondary | ICD-10-CM | POA: Diagnosis not present

## 2019-05-10 DIAGNOSIS — R2681 Unsteadiness on feet: Secondary | ICD-10-CM | POA: Diagnosis not present

## 2019-05-10 DIAGNOSIS — N179 Acute kidney failure, unspecified: Secondary | ICD-10-CM | POA: Diagnosis not present

## 2019-05-10 DIAGNOSIS — R278 Other lack of coordination: Secondary | ICD-10-CM | POA: Diagnosis not present

## 2019-05-11 DIAGNOSIS — N179 Acute kidney failure, unspecified: Secondary | ICD-10-CM | POA: Diagnosis not present

## 2019-05-11 DIAGNOSIS — D509 Iron deficiency anemia, unspecified: Secondary | ICD-10-CM | POA: Diagnosis not present

## 2019-05-11 DIAGNOSIS — R1311 Dysphagia, oral phase: Secondary | ICD-10-CM | POA: Diagnosis not present

## 2019-05-11 DIAGNOSIS — M25562 Pain in left knee: Secondary | ICD-10-CM | POA: Diagnosis not present

## 2019-05-11 DIAGNOSIS — I10 Essential (primary) hypertension: Secondary | ICD-10-CM | POA: Diagnosis not present

## 2019-05-11 DIAGNOSIS — M1990 Unspecified osteoarthritis, unspecified site: Secondary | ICD-10-CM | POA: Diagnosis not present

## 2019-05-11 DIAGNOSIS — C679 Malignant neoplasm of bladder, unspecified: Secondary | ICD-10-CM | POA: Diagnosis not present

## 2019-05-11 DIAGNOSIS — R278 Other lack of coordination: Secondary | ICD-10-CM | POA: Diagnosis not present

## 2019-05-11 DIAGNOSIS — R000 Tachycardia, unspecified: Secondary | ICD-10-CM | POA: Diagnosis not present

## 2019-05-11 DIAGNOSIS — R41841 Cognitive communication deficit: Secondary | ICD-10-CM | POA: Diagnosis not present

## 2019-05-11 DIAGNOSIS — N401 Enlarged prostate with lower urinary tract symptoms: Secondary | ICD-10-CM | POA: Diagnosis not present

## 2019-05-11 DIAGNOSIS — R2681 Unsteadiness on feet: Secondary | ICD-10-CM | POA: Diagnosis not present

## 2019-05-14 DIAGNOSIS — D509 Iron deficiency anemia, unspecified: Secondary | ICD-10-CM | POA: Diagnosis not present

## 2019-05-14 DIAGNOSIS — R41841 Cognitive communication deficit: Secondary | ICD-10-CM | POA: Diagnosis not present

## 2019-05-14 DIAGNOSIS — N179 Acute kidney failure, unspecified: Secondary | ICD-10-CM | POA: Diagnosis not present

## 2019-05-14 DIAGNOSIS — N401 Enlarged prostate with lower urinary tract symptoms: Secondary | ICD-10-CM | POA: Diagnosis not present

## 2019-05-14 DIAGNOSIS — R1311 Dysphagia, oral phase: Secondary | ICD-10-CM | POA: Diagnosis not present

## 2019-05-14 DIAGNOSIS — C679 Malignant neoplasm of bladder, unspecified: Secondary | ICD-10-CM | POA: Diagnosis not present

## 2019-05-14 DIAGNOSIS — I10 Essential (primary) hypertension: Secondary | ICD-10-CM | POA: Diagnosis not present

## 2019-05-14 DIAGNOSIS — R278 Other lack of coordination: Secondary | ICD-10-CM | POA: Diagnosis not present

## 2019-05-14 DIAGNOSIS — M1990 Unspecified osteoarthritis, unspecified site: Secondary | ICD-10-CM | POA: Diagnosis not present

## 2019-05-14 DIAGNOSIS — R000 Tachycardia, unspecified: Secondary | ICD-10-CM | POA: Diagnosis not present

## 2019-05-14 DIAGNOSIS — M25562 Pain in left knee: Secondary | ICD-10-CM | POA: Diagnosis not present

## 2019-05-14 DIAGNOSIS — R2681 Unsteadiness on feet: Secondary | ICD-10-CM | POA: Diagnosis not present

## 2019-05-15 DIAGNOSIS — R41841 Cognitive communication deficit: Secondary | ICD-10-CM | POA: Diagnosis not present

## 2019-05-15 DIAGNOSIS — M25562 Pain in left knee: Secondary | ICD-10-CM | POA: Diagnosis not present

## 2019-05-15 DIAGNOSIS — R000 Tachycardia, unspecified: Secondary | ICD-10-CM | POA: Diagnosis not present

## 2019-05-15 DIAGNOSIS — R1311 Dysphagia, oral phase: Secondary | ICD-10-CM | POA: Diagnosis not present

## 2019-05-15 DIAGNOSIS — D509 Iron deficiency anemia, unspecified: Secondary | ICD-10-CM | POA: Diagnosis not present

## 2019-05-15 DIAGNOSIS — N401 Enlarged prostate with lower urinary tract symptoms: Secondary | ICD-10-CM | POA: Diagnosis not present

## 2019-05-15 DIAGNOSIS — C679 Malignant neoplasm of bladder, unspecified: Secondary | ICD-10-CM | POA: Diagnosis not present

## 2019-05-15 DIAGNOSIS — I10 Essential (primary) hypertension: Secondary | ICD-10-CM | POA: Diagnosis not present

## 2019-05-15 DIAGNOSIS — M1990 Unspecified osteoarthritis, unspecified site: Secondary | ICD-10-CM | POA: Diagnosis not present

## 2019-05-15 DIAGNOSIS — R2681 Unsteadiness on feet: Secondary | ICD-10-CM | POA: Diagnosis not present

## 2019-05-15 DIAGNOSIS — N179 Acute kidney failure, unspecified: Secondary | ICD-10-CM | POA: Diagnosis not present

## 2019-05-15 DIAGNOSIS — R278 Other lack of coordination: Secondary | ICD-10-CM | POA: Diagnosis not present

## 2019-05-16 DIAGNOSIS — M1990 Unspecified osteoarthritis, unspecified site: Secondary | ICD-10-CM | POA: Diagnosis not present

## 2019-05-16 DIAGNOSIS — C679 Malignant neoplasm of bladder, unspecified: Secondary | ICD-10-CM | POA: Diagnosis not present

## 2019-05-16 DIAGNOSIS — R2681 Unsteadiness on feet: Secondary | ICD-10-CM | POA: Diagnosis not present

## 2019-05-16 DIAGNOSIS — R278 Other lack of coordination: Secondary | ICD-10-CM | POA: Diagnosis not present

## 2019-05-16 DIAGNOSIS — M25562 Pain in left knee: Secondary | ICD-10-CM | POA: Diagnosis not present

## 2019-05-16 DIAGNOSIS — N401 Enlarged prostate with lower urinary tract symptoms: Secondary | ICD-10-CM | POA: Diagnosis not present

## 2019-05-16 DIAGNOSIS — D509 Iron deficiency anemia, unspecified: Secondary | ICD-10-CM | POA: Diagnosis not present

## 2019-05-16 DIAGNOSIS — N179 Acute kidney failure, unspecified: Secondary | ICD-10-CM | POA: Diagnosis not present

## 2019-05-16 DIAGNOSIS — R000 Tachycardia, unspecified: Secondary | ICD-10-CM | POA: Diagnosis not present

## 2019-05-16 DIAGNOSIS — I10 Essential (primary) hypertension: Secondary | ICD-10-CM | POA: Diagnosis not present

## 2019-05-16 DIAGNOSIS — R41841 Cognitive communication deficit: Secondary | ICD-10-CM | POA: Diagnosis not present

## 2019-05-16 DIAGNOSIS — Z20828 Contact with and (suspected) exposure to other viral communicable diseases: Secondary | ICD-10-CM | POA: Diagnosis not present

## 2019-05-16 DIAGNOSIS — R1311 Dysphagia, oral phase: Secondary | ICD-10-CM | POA: Diagnosis not present

## 2019-05-17 DIAGNOSIS — M150 Primary generalized (osteo)arthritis: Secondary | ICD-10-CM | POA: Diagnosis not present

## 2019-05-17 DIAGNOSIS — D509 Iron deficiency anemia, unspecified: Secondary | ICD-10-CM | POA: Diagnosis not present

## 2019-05-17 DIAGNOSIS — M1990 Unspecified osteoarthritis, unspecified site: Secondary | ICD-10-CM | POA: Diagnosis not present

## 2019-05-17 DIAGNOSIS — N179 Acute kidney failure, unspecified: Secondary | ICD-10-CM | POA: Diagnosis not present

## 2019-05-17 DIAGNOSIS — R1311 Dysphagia, oral phase: Secondary | ICD-10-CM | POA: Diagnosis not present

## 2019-05-17 DIAGNOSIS — L89623 Pressure ulcer of left heel, stage 3: Secondary | ICD-10-CM | POA: Diagnosis not present

## 2019-05-17 DIAGNOSIS — R2681 Unsteadiness on feet: Secondary | ICD-10-CM | POA: Diagnosis not present

## 2019-05-17 DIAGNOSIS — I10 Essential (primary) hypertension: Secondary | ICD-10-CM | POA: Diagnosis not present

## 2019-05-17 DIAGNOSIS — R278 Other lack of coordination: Secondary | ICD-10-CM | POA: Diagnosis not present

## 2019-05-17 DIAGNOSIS — R000 Tachycardia, unspecified: Secondary | ICD-10-CM | POA: Diagnosis not present

## 2019-05-17 DIAGNOSIS — R296 Repeated falls: Secondary | ICD-10-CM | POA: Diagnosis not present

## 2019-05-17 DIAGNOSIS — N401 Enlarged prostate with lower urinary tract symptoms: Secondary | ICD-10-CM | POA: Diagnosis not present

## 2019-05-17 DIAGNOSIS — M25562 Pain in left knee: Secondary | ICD-10-CM | POA: Diagnosis not present

## 2019-05-17 DIAGNOSIS — S81801D Unspecified open wound, right lower leg, subsequent encounter: Secondary | ICD-10-CM | POA: Diagnosis not present

## 2019-05-17 DIAGNOSIS — C679 Malignant neoplasm of bladder, unspecified: Secondary | ICD-10-CM | POA: Diagnosis not present

## 2019-05-17 DIAGNOSIS — R41841 Cognitive communication deficit: Secondary | ICD-10-CM | POA: Diagnosis not present

## 2019-05-17 DIAGNOSIS — D508 Other iron deficiency anemias: Secondary | ICD-10-CM | POA: Diagnosis not present

## 2019-05-18 DIAGNOSIS — N179 Acute kidney failure, unspecified: Secondary | ICD-10-CM | POA: Diagnosis not present

## 2019-05-18 DIAGNOSIS — N401 Enlarged prostate with lower urinary tract symptoms: Secondary | ICD-10-CM | POA: Diagnosis not present

## 2019-05-18 DIAGNOSIS — C679 Malignant neoplasm of bladder, unspecified: Secondary | ICD-10-CM | POA: Diagnosis not present

## 2019-05-18 DIAGNOSIS — R41841 Cognitive communication deficit: Secondary | ICD-10-CM | POA: Diagnosis not present

## 2019-05-18 DIAGNOSIS — M1990 Unspecified osteoarthritis, unspecified site: Secondary | ICD-10-CM | POA: Diagnosis not present

## 2019-05-18 DIAGNOSIS — M25562 Pain in left knee: Secondary | ICD-10-CM | POA: Diagnosis not present

## 2019-05-18 DIAGNOSIS — I10 Essential (primary) hypertension: Secondary | ICD-10-CM | POA: Diagnosis not present

## 2019-05-18 DIAGNOSIS — R278 Other lack of coordination: Secondary | ICD-10-CM | POA: Diagnosis not present

## 2019-05-18 DIAGNOSIS — D509 Iron deficiency anemia, unspecified: Secondary | ICD-10-CM | POA: Diagnosis not present

## 2019-05-18 DIAGNOSIS — R1311 Dysphagia, oral phase: Secondary | ICD-10-CM | POA: Diagnosis not present

## 2019-05-18 DIAGNOSIS — R2681 Unsteadiness on feet: Secondary | ICD-10-CM | POA: Diagnosis not present

## 2019-05-18 DIAGNOSIS — R000 Tachycardia, unspecified: Secondary | ICD-10-CM | POA: Diagnosis not present

## 2019-05-21 DIAGNOSIS — N179 Acute kidney failure, unspecified: Secondary | ICD-10-CM | POA: Diagnosis not present

## 2019-05-21 DIAGNOSIS — R000 Tachycardia, unspecified: Secondary | ICD-10-CM | POA: Diagnosis not present

## 2019-05-21 DIAGNOSIS — C679 Malignant neoplasm of bladder, unspecified: Secondary | ICD-10-CM | POA: Diagnosis not present

## 2019-05-21 DIAGNOSIS — M25562 Pain in left knee: Secondary | ICD-10-CM | POA: Diagnosis not present

## 2019-05-21 DIAGNOSIS — M1990 Unspecified osteoarthritis, unspecified site: Secondary | ICD-10-CM | POA: Diagnosis not present

## 2019-05-21 DIAGNOSIS — R1311 Dysphagia, oral phase: Secondary | ICD-10-CM | POA: Diagnosis not present

## 2019-05-21 DIAGNOSIS — D509 Iron deficiency anemia, unspecified: Secondary | ICD-10-CM | POA: Diagnosis not present

## 2019-05-21 DIAGNOSIS — I10 Essential (primary) hypertension: Secondary | ICD-10-CM | POA: Diagnosis not present

## 2019-05-21 DIAGNOSIS — N401 Enlarged prostate with lower urinary tract symptoms: Secondary | ICD-10-CM | POA: Diagnosis not present

## 2019-05-21 DIAGNOSIS — R278 Other lack of coordination: Secondary | ICD-10-CM | POA: Diagnosis not present

## 2019-05-21 DIAGNOSIS — R41841 Cognitive communication deficit: Secondary | ICD-10-CM | POA: Diagnosis not present

## 2019-05-21 DIAGNOSIS — R2681 Unsteadiness on feet: Secondary | ICD-10-CM | POA: Diagnosis not present

## 2019-05-22 DIAGNOSIS — R1311 Dysphagia, oral phase: Secondary | ICD-10-CM | POA: Diagnosis not present

## 2019-05-22 DIAGNOSIS — M25562 Pain in left knee: Secondary | ICD-10-CM | POA: Diagnosis not present

## 2019-05-22 DIAGNOSIS — R278 Other lack of coordination: Secondary | ICD-10-CM | POA: Diagnosis not present

## 2019-05-22 DIAGNOSIS — R2681 Unsteadiness on feet: Secondary | ICD-10-CM | POA: Diagnosis not present

## 2019-05-22 DIAGNOSIS — N179 Acute kidney failure, unspecified: Secondary | ICD-10-CM | POA: Diagnosis not present

## 2019-05-22 DIAGNOSIS — C679 Malignant neoplasm of bladder, unspecified: Secondary | ICD-10-CM | POA: Diagnosis not present

## 2019-05-22 DIAGNOSIS — N401 Enlarged prostate with lower urinary tract symptoms: Secondary | ICD-10-CM | POA: Diagnosis not present

## 2019-05-22 DIAGNOSIS — R000 Tachycardia, unspecified: Secondary | ICD-10-CM | POA: Diagnosis not present

## 2019-05-22 DIAGNOSIS — R41841 Cognitive communication deficit: Secondary | ICD-10-CM | POA: Diagnosis not present

## 2019-05-22 DIAGNOSIS — D509 Iron deficiency anemia, unspecified: Secondary | ICD-10-CM | POA: Diagnosis not present

## 2019-05-22 DIAGNOSIS — M1990 Unspecified osteoarthritis, unspecified site: Secondary | ICD-10-CM | POA: Diagnosis not present

## 2019-05-22 DIAGNOSIS — I10 Essential (primary) hypertension: Secondary | ICD-10-CM | POA: Diagnosis not present

## 2019-05-23 DIAGNOSIS — N179 Acute kidney failure, unspecified: Secondary | ICD-10-CM | POA: Diagnosis not present

## 2019-05-23 DIAGNOSIS — N401 Enlarged prostate with lower urinary tract symptoms: Secondary | ICD-10-CM | POA: Diagnosis not present

## 2019-05-23 DIAGNOSIS — R278 Other lack of coordination: Secondary | ICD-10-CM | POA: Diagnosis not present

## 2019-05-23 DIAGNOSIS — D509 Iron deficiency anemia, unspecified: Secondary | ICD-10-CM | POA: Diagnosis not present

## 2019-05-23 DIAGNOSIS — R000 Tachycardia, unspecified: Secondary | ICD-10-CM | POA: Diagnosis not present

## 2019-05-23 DIAGNOSIS — M1990 Unspecified osteoarthritis, unspecified site: Secondary | ICD-10-CM | POA: Diagnosis not present

## 2019-05-23 DIAGNOSIS — R41841 Cognitive communication deficit: Secondary | ICD-10-CM | POA: Diagnosis not present

## 2019-05-23 DIAGNOSIS — Z20828 Contact with and (suspected) exposure to other viral communicable diseases: Secondary | ICD-10-CM | POA: Diagnosis not present

## 2019-05-23 DIAGNOSIS — R1311 Dysphagia, oral phase: Secondary | ICD-10-CM | POA: Diagnosis not present

## 2019-05-23 DIAGNOSIS — M25562 Pain in left knee: Secondary | ICD-10-CM | POA: Diagnosis not present

## 2019-05-23 DIAGNOSIS — I10 Essential (primary) hypertension: Secondary | ICD-10-CM | POA: Diagnosis not present

## 2019-05-23 DIAGNOSIS — R2681 Unsteadiness on feet: Secondary | ICD-10-CM | POA: Diagnosis not present

## 2019-05-23 DIAGNOSIS — C679 Malignant neoplasm of bladder, unspecified: Secondary | ICD-10-CM | POA: Diagnosis not present

## 2019-05-24 DIAGNOSIS — N179 Acute kidney failure, unspecified: Secondary | ICD-10-CM | POA: Diagnosis not present

## 2019-05-24 DIAGNOSIS — R1311 Dysphagia, oral phase: Secondary | ICD-10-CM | POA: Diagnosis not present

## 2019-05-24 DIAGNOSIS — M1990 Unspecified osteoarthritis, unspecified site: Secondary | ICD-10-CM | POA: Diagnosis not present

## 2019-05-24 DIAGNOSIS — I10 Essential (primary) hypertension: Secondary | ICD-10-CM | POA: Diagnosis not present

## 2019-05-24 DIAGNOSIS — R000 Tachycardia, unspecified: Secondary | ICD-10-CM | POA: Diagnosis not present

## 2019-05-24 DIAGNOSIS — N401 Enlarged prostate with lower urinary tract symptoms: Secondary | ICD-10-CM | POA: Diagnosis not present

## 2019-05-24 DIAGNOSIS — C679 Malignant neoplasm of bladder, unspecified: Secondary | ICD-10-CM | POA: Diagnosis not present

## 2019-05-24 DIAGNOSIS — R278 Other lack of coordination: Secondary | ICD-10-CM | POA: Diagnosis not present

## 2019-05-24 DIAGNOSIS — R2681 Unsteadiness on feet: Secondary | ICD-10-CM | POA: Diagnosis not present

## 2019-05-24 DIAGNOSIS — D509 Iron deficiency anemia, unspecified: Secondary | ICD-10-CM | POA: Diagnosis not present

## 2019-05-24 DIAGNOSIS — R41841 Cognitive communication deficit: Secondary | ICD-10-CM | POA: Diagnosis not present

## 2019-05-24 DIAGNOSIS — M25562 Pain in left knee: Secondary | ICD-10-CM | POA: Diagnosis not present

## 2019-05-25 DIAGNOSIS — F0390 Unspecified dementia without behavioral disturbance: Secondary | ICD-10-CM | POA: Diagnosis not present

## 2019-05-25 DIAGNOSIS — E440 Moderate protein-calorie malnutrition: Secondary | ICD-10-CM | POA: Diagnosis not present

## 2019-05-25 DIAGNOSIS — R1310 Dysphagia, unspecified: Secondary | ICD-10-CM | POA: Diagnosis not present

## 2019-05-25 DIAGNOSIS — L97409 Non-pressure chronic ulcer of unspecified heel and midfoot with unspecified severity: Secondary | ICD-10-CM | POA: Diagnosis not present

## 2019-05-30 ENCOUNTER — Encounter: Payer: Self-pay | Admitting: *Deleted

## 2019-06-04 DIAGNOSIS — M150 Primary generalized (osteo)arthritis: Secondary | ICD-10-CM | POA: Diagnosis not present

## 2019-06-04 DIAGNOSIS — N400 Enlarged prostate without lower urinary tract symptoms: Secondary | ICD-10-CM | POA: Diagnosis not present

## 2019-06-04 DIAGNOSIS — L89623 Pressure ulcer of left heel, stage 3: Secondary | ICD-10-CM | POA: Diagnosis not present

## 2019-06-04 DIAGNOSIS — I10 Essential (primary) hypertension: Secondary | ICD-10-CM | POA: Diagnosis not present

## 2019-06-07 DIAGNOSIS — L89623 Pressure ulcer of left heel, stage 3: Secondary | ICD-10-CM | POA: Diagnosis not present

## 2019-06-14 DIAGNOSIS — L89623 Pressure ulcer of left heel, stage 3: Secondary | ICD-10-CM | POA: Diagnosis not present

## 2019-06-21 DIAGNOSIS — L89620 Pressure ulcer of left heel, unstageable: Secondary | ICD-10-CM | POA: Diagnosis not present

## 2019-06-21 DIAGNOSIS — L89623 Pressure ulcer of left heel, stage 3: Secondary | ICD-10-CM | POA: Diagnosis not present

## 2019-06-28 DIAGNOSIS — L89623 Pressure ulcer of left heel, stage 3: Secondary | ICD-10-CM | POA: Diagnosis not present

## 2019-06-28 DIAGNOSIS — L89620 Pressure ulcer of left heel, unstageable: Secondary | ICD-10-CM | POA: Diagnosis not present

## 2019-06-29 DIAGNOSIS — F0390 Unspecified dementia without behavioral disturbance: Secondary | ICD-10-CM | POA: Diagnosis not present

## 2019-06-29 DIAGNOSIS — L97409 Non-pressure chronic ulcer of unspecified heel and midfoot with unspecified severity: Secondary | ICD-10-CM | POA: Diagnosis not present

## 2019-06-29 DIAGNOSIS — E440 Moderate protein-calorie malnutrition: Secondary | ICD-10-CM | POA: Diagnosis not present

## 2019-06-29 DIAGNOSIS — R1310 Dysphagia, unspecified: Secondary | ICD-10-CM | POA: Diagnosis not present

## 2019-07-03 DIAGNOSIS — C679 Malignant neoplasm of bladder, unspecified: Secondary | ICD-10-CM | POA: Diagnosis not present

## 2019-07-03 DIAGNOSIS — E46 Unspecified protein-calorie malnutrition: Secondary | ICD-10-CM | POA: Diagnosis not present

## 2019-07-03 DIAGNOSIS — I10 Essential (primary) hypertension: Secondary | ICD-10-CM | POA: Diagnosis not present

## 2019-07-03 DIAGNOSIS — L89623 Pressure ulcer of left heel, stage 3: Secondary | ICD-10-CM | POA: Diagnosis not present

## 2019-07-05 DIAGNOSIS — L89620 Pressure ulcer of left heel, unstageable: Secondary | ICD-10-CM | POA: Diagnosis not present

## 2019-07-05 DIAGNOSIS — L89623 Pressure ulcer of left heel, stage 3: Secondary | ICD-10-CM | POA: Diagnosis not present

## 2019-07-06 ENCOUNTER — Non-Acute Institutional Stay: Payer: Medicare Other | Admitting: Internal Medicine

## 2019-07-06 ENCOUNTER — Encounter: Payer: Self-pay | Admitting: Internal Medicine

## 2019-07-06 ENCOUNTER — Other Ambulatory Visit: Payer: Self-pay

## 2019-07-06 DIAGNOSIS — Z515 Encounter for palliative care: Secondary | ICD-10-CM

## 2019-07-06 NOTE — Progress Notes (Addendum)
August 28th, 2020 [facility] Palliative Care Consult Note Telephone: [phone]  Fax: [phone]  Due to the current COVID-19 infection/crises, facility is not allowing providers to enter. The patient has given his verbal consent for, a provider visit via telehealth, from my office. HIPPA policies of confidentially were discussed.  PATIENT NAME: Zachary Obrien DOB: Apr 06, 1921 MRN: [id number] [facility] [id number] (admit date 6/16/20020)  PRIMARY CARE PROVIDER:   Josetta Huddle, MD [address] [place] [address] [place], [place] [address]  REFERRING PROVIDER:  Suzzanne Cloud PA-C ([facility] [phone] Dr. Wenda Low  Responsible Person: (son) Nicolette Bang) [place] [phone], DIL Trish Mage [phone] [email or web address]  ASSESSMENT / RECOMMENDATIONS:  1. Advance Care Planning: a. Directives: DNR / MOST on chart per nursing report.   b. Goals of Care: states fine with living on at [facility]  2. Symptom Management: Episodic pain in upper R leg managed with scheduled and prn Tylenol.   3.Cognitive / Functional status: A & O x 3. Family report cognitive decline. A little hard of hearing. Staff report patient is an assist to transfer, for hygiene, and for dressing. Patient reports he is sleeping much of the day. Able to sit up in wheelchair. Able to feed himself. L heel stage 3 pressure injury followed by wound care team; debrided yesterday per nsg report.    4. Family Supports: Previous resident of [facility]. Three married sons.   5. Follow up Palliative Care Visit: 08/10/2019 at 10am. I spoke with PCG son Masaichi Vanacker. We discussed patient's ongoing weight loss, and slow but progressive cognitive decline. Gwyndolyn Saxon requested updates on his dad's weights, and to follow and refer patient for hospice services, should he meet eligibility criteria (Protein Calorie Malnutrition).  I spent 60 minutes providing this  consultation from 8:30am to 9:30am. More than 50% of the time in this consultation was spent coordinating communication.   HISTORY OF PRESENT ILLNESS: Zachary Obrien is a 85 yo male resident [facility] with medical history of bladder cancer (diagnosed 2013; resected) , iron deficiency anemia, HTN, pressure ulcer L heel stage 3,  BPH, dysphagia,  and osteoarthritis.  Hospital adm 5/10-5/14/2020 treatment of head injury s/p fall, and for UTI. He was found to be COVID positive. AKI. Incidental finding of R lung base subpleural nodule (16mm). Palliative Care was asked to help address goals of care.   CODE STATUS:  DNR on chart. MOST on chart: DNR/DNI, Antibiotics and IVFs if indicated.  PPS: 30%  HOSPICE ELIGIBILITY/DIAGNOSIS: TBD  PAST MEDICAL HISTORY:  Past Medical History:  Diagnosis Date  . 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 Bladder cancer ([place])   . Bladder neoplasm   . 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 BPH (benign prostatic hyperplasia)   . Dermatophytosis of nail   . No pertinent past medical history   . Osteoarthritis     SOCIAL HX:  Social History   Tobacco Use  . Smoking status: Former Smoker    Packs/day: 2.00    Years: 20.00    Pack years: 40.00    Types: Cigarettes    Quit date: 11/08/1965    Years since quitting: 53.6  . Smokeless tobacco: Never Used  Substance Use Topics  . Alcohol use: Yes    Alcohol/week: 2.0 standard drinks    Types: 2 Glasses of wine per week    ALLERGIES: No Known Allergies   PERTINENT MEDICATIONS:  Outpatient Encounter Medications as of 07/06/2019  Medication Sig  . acetaminophen (TYLENOL) 325 MG tablet Take 2 tablets (650 mg total) by mouth every 6 (six) hours as needed for mild pain, fever or headache (fever >/= 101). (Patient taking differently: Take 650 mg by mouth daily. And q 6h prn)  . aspirin 81 MG chewable tablet Chew 81 mg by mouth daily.  . cholecalciferol (VITAMIN D) 1000 units  tablet Take 1,000 Units by mouth daily.  Marland Kitchen docusate sodium (COLACE) 100 MG capsule Take 100 mg by mouth daily as needed for mild constipation.  . ferrous sulfate 325 (65 FE) MG tablet Take 325 mg by mouth 2 (two) times a day.   . finasteride (PROSCAR) 5 MG tablet Take 5 mg by mouth daily.   . metoprolol tartrate (LOPRESSOR) 25 MG tablet Take 25 mg by mouth 2 (two) times daily.  . vitamin B-12 (CYANOCOBALAMIN) 500 MCG tablet Take 500 mcg by mouth daily.   . [DISCONTINUED] vitamin C (VITAMIN C) 500 MG tablet Take 1 tablet (500 mg total) by mouth 2 (two) times daily. Please take for 10 days  . [DISCONTINUED] zinc sulfate 220 (50 Zn) MG capsule Take 1 capsule (220 mg total) by mouth daily. Please take for 10 days.   No facility-administered encounter medications on file as of 07/06/2019.     PHYSICAL EXAM:   (7/27/202 VS: BP 126/78, HR 78, RR 16, T 96.59F)  Limited exam d/t telehealth nature of visit. ZOOM assisted by facility SW Thin, frail appearing elderly gentleman who is alert, oriented, and pleasantly conversant. No dyspnea with conversation.  Evidence of marked UE adipose and muscular wasting  Julianne Handler, NP

## 2019-07-24 DIAGNOSIS — I10 Essential (primary) hypertension: Secondary | ICD-10-CM | POA: Diagnosis not present

## 2019-07-24 DIAGNOSIS — E46 Unspecified protein-calorie malnutrition: Secondary | ICD-10-CM | POA: Diagnosis not present

## 2019-07-24 DIAGNOSIS — C679 Malignant neoplasm of bladder, unspecified: Secondary | ICD-10-CM | POA: Diagnosis not present

## 2019-07-24 DIAGNOSIS — L89623 Pressure ulcer of left heel, stage 3: Secondary | ICD-10-CM | POA: Diagnosis not present

## 2019-07-27 DIAGNOSIS — L97409 Non-pressure chronic ulcer of unspecified heel and midfoot with unspecified severity: Secondary | ICD-10-CM | POA: Diagnosis not present

## 2019-07-27 DIAGNOSIS — E440 Moderate protein-calorie malnutrition: Secondary | ICD-10-CM | POA: Diagnosis not present

## 2019-07-27 DIAGNOSIS — F0390 Unspecified dementia without behavioral disturbance: Secondary | ICD-10-CM | POA: Diagnosis not present

## 2019-07-27 DIAGNOSIS — R1310 Dysphagia, unspecified: Secondary | ICD-10-CM | POA: Diagnosis not present

## 2019-08-02 DIAGNOSIS — L89623 Pressure ulcer of left heel, stage 3: Secondary | ICD-10-CM | POA: Diagnosis not present

## 2019-08-09 DIAGNOSIS — Z20828 Contact with and (suspected) exposure to other viral communicable diseases: Secondary | ICD-10-CM | POA: Diagnosis not present

## 2019-08-13 DIAGNOSIS — I10 Essential (primary) hypertension: Secondary | ICD-10-CM | POA: Diagnosis not present

## 2019-08-13 DIAGNOSIS — L89620 Pressure ulcer of left heel, unstageable: Secondary | ICD-10-CM | POA: Diagnosis not present

## 2019-08-13 DIAGNOSIS — E46 Unspecified protein-calorie malnutrition: Secondary | ICD-10-CM | POA: Diagnosis not present

## 2019-08-13 DIAGNOSIS — L89153 Pressure ulcer of sacral region, stage 3: Secondary | ICD-10-CM | POA: Diagnosis not present

## 2019-08-15 DIAGNOSIS — Z20828 Contact with and (suspected) exposure to other viral communicable diseases: Secondary | ICD-10-CM | POA: Diagnosis not present

## 2019-08-28 DIAGNOSIS — Z20828 Contact with and (suspected) exposure to other viral communicable diseases: Secondary | ICD-10-CM | POA: Diagnosis not present

## 2019-08-31 DIAGNOSIS — R1310 Dysphagia, unspecified: Secondary | ICD-10-CM | POA: Diagnosis not present

## 2019-08-31 DIAGNOSIS — R109 Unspecified abdominal pain: Secondary | ICD-10-CM | POA: Diagnosis not present

## 2019-08-31 DIAGNOSIS — F0390 Unspecified dementia without behavioral disturbance: Secondary | ICD-10-CM | POA: Diagnosis not present

## 2019-08-31 DIAGNOSIS — L89159 Pressure ulcer of sacral region, unspecified stage: Secondary | ICD-10-CM | POA: Diagnosis not present

## 2019-08-31 DIAGNOSIS — E440 Moderate protein-calorie malnutrition: Secondary | ICD-10-CM | POA: Diagnosis not present

## 2019-09-03 ENCOUNTER — Encounter: Payer: Self-pay | Admitting: Internal Medicine

## 2019-09-03 ENCOUNTER — Non-Acute Institutional Stay: Payer: Medicare Other | Admitting: Internal Medicine

## 2019-09-03 ENCOUNTER — Other Ambulatory Visit: Payer: Self-pay

## 2019-09-03 DIAGNOSIS — I10 Essential (primary) hypertension: Secondary | ICD-10-CM | POA: Diagnosis not present

## 2019-09-03 DIAGNOSIS — L89150 Pressure ulcer of sacral region, unstageable: Secondary | ICD-10-CM | POA: Diagnosis not present

## 2019-09-03 DIAGNOSIS — K5909 Other constipation: Secondary | ICD-10-CM | POA: Diagnosis not present

## 2019-09-03 DIAGNOSIS — Z515 Encounter for palliative care: Secondary | ICD-10-CM

## 2019-09-03 DIAGNOSIS — N400 Enlarged prostate without lower urinary tract symptoms: Secondary | ICD-10-CM | POA: Diagnosis not present

## 2019-09-03 NOTE — Progress Notes (Signed)
Oct 26th, 2020 [facility] Palliative Care Consult Note Telephone: [phone]  Fax: [phone]  Due to the current COVID-19 infection/crises, the family prefer, and have given their verbal consent for, a provider visit via telemedicine. HIPPA policies of confidentially were discussed. Video-audio (telehealth) contact was unable to be done due technical barriers from the son's side.  PATIENT NAME: Zachary Obrien DOB: [birth year] MRN: [id number] [facility] [id number] (admit date 6/16/20020)  PRIMARY CARE PROVIDER:   Josetta Huddle, MD [address] [place], [place] [address]  REFERRING PROVIDER:  Suzzanne Cloud PA-C ([facility] [phone] Dr. Wenda Low  Responsible Person: (son) Nicolette Bang) [place] [phone], DIL Trish Mage [phone] [email or web address]  ASSESSMENT / RECOMMENDATIONS:  1. Goals of Care discussion:  Discussion with son [name], who wished to update me regarding patient's ongoing signs of cognitive, functional, and physical decline. Bill's brother has a recent tele-health visit with patient. At that time, patient would / could not open his eyes. Patient has had extension of pressure injuries, now on buttocks as well as heels. Bill's last visit with patient was some 5 weeks earlier (outside) but within the last 2 weeks the facility has limited patient visits to tele-health only d/t staff member testing positive for COVID. [name] is hoping that face to face visits will resume within the next few weeks or so. We reconfirmed patient's DNR status. [name] would be open to hospice services for his dad should patient meet criteria, and depending on the degree of hospice personnel  presence allowed within the facility (COVID restrictions).    2. Follow up: [name] has a Health Assessment Meeting (?ZOOM) scheduled on Wed Oct 28th, and Bill requests that I join in at that time. I'll contact [facility] SW Al Pimple for  permission.  I spent 60 minutes providing this consultation from 11:30am to 11:46am. More than 50% of the time in this consultation was spent coordinating communication.   HISTORY OF PRESENT ILLNESS: Zachary Obrien is a 85 yo male resident [facility] with medical history of bladder cancer (diagnosed 2013; resected) , iron deficiency anemia, HTN, pressure ulcer L heel stage 3,  BPH, dysphagia,  and osteoarthritis.  Hospital adm 5/10-5/14/2020 treatment of head injury s/p fall, and for UTI. He was found to be COVID positive. AKI. Incidental finding of R lung base subpleural nodule (41mm). This is a Palliative Care f/u from Aug 28th at the request of patient's son, to help address goals of care.   CODE STATUS:  DNR on chart. MOST on chart: DNR/DNI, Antibiotics and IVFs if indicated.  PPS: 30%  HOSPICE ELIGIBILITY/DIAGNOSIS: TBD  PAST MEDICAL HISTORY:      Past Medical History:  Diagnosis Date  . 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 Bladder cancer ([place])   . Bladder neoplasm   . 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 BPH (benign prostatic hyperplasia)   . Dermatophytosis of nail   . No pertinent past medical history   . Osteoarthritis     SOCIAL HX:  Social History        Tobacco Use  . Smoking status: Former Smoker    Packs/day: 2.00    Years: 20.00    Pack years: 40.00    Types: Cigarettes    Quit date: 11/08/1965    Years since quitting: 53.6  . Smokeless tobacco: Never Used  Substance Use Topics  . Alcohol use: Yes  Alcohol/week: 2.0 standard drinks    Types: 2 Glasses of wine per week    ALLERGIES: No Known Allergies   PERTINENT MEDICATIONS:      Outpatient Encounter Medications as of 07/06/2019  Medication Sig  . acetaminophen (TYLENOL) 325 MG tablet Take 2 tablets (650 mg total) by mouth every 6 (six) hours as needed for mild pain, fever or headache (fever >/= 101). (Patient taking  differently: Take 650 mg by mouth daily. And q 6h prn)  . aspirin 81 MG chewable tablet Chew 81 mg by mouth daily.  . cholecalciferol (VITAMIN D) 1000 units tablet Take 1,000 Units by mouth daily.  Marland Kitchen docusate sodium (COLACE) 100 MG capsule Take 100 mg by mouth daily as needed for mild constipation.  . ferrous sulfate 325 (65 FE) MG tablet Take 325 mg by mouth 2 (two) times a day.   . finasteride (PROSCAR) 5 MG tablet Take 5 mg by mouth daily.   . metoprolol tartrate (LOPRESSOR) 25 MG tablet Take 25 mg by mouth 2 (two) times daily.  . vitamin B-12 (CYANOCOBALAMIN) 500 MCG tablet Take 500 mcg by mouth daily.   . [DISCONTINUED] vitamin C (VITAMIN C) 500 MG tablet Take 1 tablet (500 mg total) by mouth 2 (two) times daily. Please take for 10 days  . [DISCONTINUED] zinc sulfate 220 (50 Zn) MG capsule Take 1 capsule (220 mg total) by mouth daily. Please take for 10 days.   No facility-administered encounter medications on file as of 07/06/2019.     PHYSICAL EXAM:   Physical exam deferred d/t tele-health / audio only nature of visit, with son [name]; goals of care meeting.  Julianne Handler, NP

## 2019-09-04 ENCOUNTER — Encounter: Payer: Self-pay | Admitting: Internal Medicine

## 2019-09-04 DIAGNOSIS — Z20828 Contact with and (suspected) exposure to other viral communicable diseases: Secondary | ICD-10-CM | POA: Diagnosis not present

## 2019-09-05 ENCOUNTER — Emergency Department (HOSPITAL_COMMUNITY): Payer: Medicare Other

## 2019-09-05 ENCOUNTER — Other Ambulatory Visit: Payer: Self-pay

## 2019-09-05 ENCOUNTER — Encounter (HOSPITAL_COMMUNITY): Payer: Self-pay | Admitting: *Deleted

## 2019-09-05 ENCOUNTER — Encounter: Payer: Self-pay | Admitting: Internal Medicine

## 2019-09-05 ENCOUNTER — Inpatient Hospital Stay (HOSPITAL_COMMUNITY)
Admission: EM | Admit: 2019-09-05 | Discharge: 2019-09-14 | DRG: 682 | Disposition: A | Discharge status: Hospice | Payer: Medicare Other | Attending: Internal Medicine | Admitting: Internal Medicine

## 2019-09-05 ENCOUNTER — Non-Acute Institutional Stay: Payer: Medicare Other | Admitting: Internal Medicine

## 2019-09-05 DIAGNOSIS — F0390 Unspecified dementia without behavioral disturbance: Secondary | ICD-10-CM | POA: Diagnosis present

## 2019-09-05 DIAGNOSIS — E860 Dehydration: Secondary | ICD-10-CM | POA: Diagnosis not present

## 2019-09-05 DIAGNOSIS — E876 Hypokalemia: Secondary | ICD-10-CM | POA: Diagnosis present

## 2019-09-05 DIAGNOSIS — N170 Acute kidney failure with tubular necrosis: Principal | ICD-10-CM | POA: Diagnosis present

## 2019-09-05 DIAGNOSIS — Z515 Encounter for palliative care: Secondary | ICD-10-CM | POA: Diagnosis present

## 2019-09-05 DIAGNOSIS — N136 Pyonephrosis: Secondary | ICD-10-CM | POA: Diagnosis not present

## 2019-09-05 DIAGNOSIS — Z79899 Other long term (current) drug therapy: Secondary | ICD-10-CM | POA: Diagnosis not present

## 2019-09-05 DIAGNOSIS — R531 Weakness: Secondary | ICD-10-CM

## 2019-09-05 DIAGNOSIS — E872 Acidosis: Secondary | ICD-10-CM | POA: Diagnosis present

## 2019-09-05 DIAGNOSIS — R599 Enlarged lymph nodes, unspecified: Secondary | ICD-10-CM | POA: Diagnosis present

## 2019-09-05 DIAGNOSIS — I700 Atherosclerosis of aorta: Secondary | ICD-10-CM | POA: Diagnosis not present

## 2019-09-05 DIAGNOSIS — N390 Urinary tract infection, site not specified: Secondary | ICD-10-CM | POA: Diagnosis not present

## 2019-09-05 DIAGNOSIS — N400 Enlarged prostate without lower urinary tract symptoms: Secondary | ICD-10-CM | POA: Diagnosis not present

## 2019-09-05 DIAGNOSIS — M1990 Unspecified osteoarthritis, unspecified site: Secondary | ICD-10-CM | POA: Diagnosis present

## 2019-09-05 DIAGNOSIS — E870 Hyperosmolality and hypernatremia: Secondary | ICD-10-CM | POA: Diagnosis not present

## 2019-09-05 DIAGNOSIS — I361 Nonrheumatic tricuspid (valve) insufficiency: Secondary | ICD-10-CM | POA: Diagnosis not present

## 2019-09-05 DIAGNOSIS — R627 Adult failure to thrive: Secondary | ICD-10-CM | POA: Diagnosis present

## 2019-09-05 DIAGNOSIS — D649 Anemia, unspecified: Secondary | ICD-10-CM | POA: Diagnosis present

## 2019-09-05 DIAGNOSIS — N183 Chronic kidney disease, stage 3 unspecified: Secondary | ICD-10-CM | POA: Diagnosis present

## 2019-09-05 DIAGNOSIS — Z96651 Presence of right artificial knee joint: Secondary | ICD-10-CM | POA: Diagnosis present

## 2019-09-05 DIAGNOSIS — I5043 Acute on chronic combined systolic (congestive) and diastolic (congestive) heart failure: Secondary | ICD-10-CM | POA: Diagnosis not present

## 2019-09-05 DIAGNOSIS — Z961 Presence of intraocular lens: Secondary | ICD-10-CM | POA: Diagnosis present

## 2019-09-05 DIAGNOSIS — Z66 Do not resuscitate: Secondary | ICD-10-CM | POA: Diagnosis not present

## 2019-09-05 DIAGNOSIS — R140 Abdominal distension (gaseous): Secondary | ICD-10-CM | POA: Diagnosis not present

## 2019-09-05 DIAGNOSIS — R4182 Altered mental status, unspecified: Secondary | ICD-10-CM | POA: Diagnosis not present

## 2019-09-05 DIAGNOSIS — R109 Unspecified abdominal pain: Secondary | ICD-10-CM | POA: Diagnosis not present

## 2019-09-05 DIAGNOSIS — L8990 Pressure ulcer of unspecified site, unspecified stage: Secondary | ICD-10-CM | POA: Insufficient documentation

## 2019-09-05 DIAGNOSIS — M4628 Osteomyelitis of vertebra, sacral and sacrococcygeal region: Secondary | ICD-10-CM | POA: Diagnosis not present

## 2019-09-05 DIAGNOSIS — E440 Moderate protein-calorie malnutrition: Secondary | ICD-10-CM | POA: Diagnosis not present

## 2019-09-05 DIAGNOSIS — R0989 Other specified symptoms and signs involving the circulatory and respiratory systems: Secondary | ICD-10-CM | POA: Diagnosis not present

## 2019-09-05 DIAGNOSIS — Z1629 Resistance to other single specified antibiotic: Secondary | ICD-10-CM | POA: Diagnosis present

## 2019-09-05 DIAGNOSIS — N401 Enlarged prostate with lower urinary tract symptoms: Secondary | ICD-10-CM | POA: Diagnosis not present

## 2019-09-05 DIAGNOSIS — Z8551 Personal history of malignant neoplasm of bladder: Secondary | ICD-10-CM

## 2019-09-05 DIAGNOSIS — R0902 Hypoxemia: Secondary | ICD-10-CM | POA: Diagnosis not present

## 2019-09-05 DIAGNOSIS — Z87440 Personal history of urinary (tract) infections: Secondary | ICD-10-CM

## 2019-09-05 DIAGNOSIS — Z7401 Bed confinement status: Secondary | ICD-10-CM

## 2019-09-05 DIAGNOSIS — N1330 Unspecified hydronephrosis: Secondary | ICD-10-CM | POA: Diagnosis not present

## 2019-09-05 DIAGNOSIS — Z8619 Personal history of other infectious and parasitic diseases: Secondary | ICD-10-CM

## 2019-09-05 DIAGNOSIS — B9620 Unspecified Escherichia coli [E. coli] as the cause of diseases classified elsewhere: Secondary | ICD-10-CM | POA: Diagnosis present

## 2019-09-05 DIAGNOSIS — Z87891 Personal history of nicotine dependence: Secondary | ICD-10-CM

## 2019-09-05 DIAGNOSIS — Z20828 Contact with and (suspected) exposure to other viral communicable diseases: Secondary | ICD-10-CM | POA: Diagnosis not present

## 2019-09-05 DIAGNOSIS — Z86007 Personal history of in-situ neoplasm of skin: Secondary | ICD-10-CM

## 2019-09-05 DIAGNOSIS — Z7982 Long term (current) use of aspirin: Secondary | ICD-10-CM

## 2019-09-05 DIAGNOSIS — C679 Malignant neoplasm of bladder, unspecified: Secondary | ICD-10-CM | POA: Diagnosis not present

## 2019-09-05 DIAGNOSIS — Z7189 Other specified counseling: Secondary | ICD-10-CM | POA: Diagnosis not present

## 2019-09-05 DIAGNOSIS — L89153 Pressure ulcer of sacral region, stage 3: Secondary | ICD-10-CM | POA: Diagnosis not present

## 2019-09-05 DIAGNOSIS — N179 Acute kidney failure, unspecified: Secondary | ICD-10-CM

## 2019-09-05 DIAGNOSIS — I10 Essential (primary) hypertension: Secondary | ICD-10-CM | POA: Diagnosis not present

## 2019-09-05 DIAGNOSIS — I351 Nonrheumatic aortic (valve) insufficiency: Secondary | ICD-10-CM | POA: Diagnosis not present

## 2019-09-05 DIAGNOSIS — Z8249 Family history of ischemic heart disease and other diseases of the circulatory system: Secondary | ICD-10-CM

## 2019-09-05 DIAGNOSIS — S91309A Unspecified open wound, unspecified foot, initial encounter: Secondary | ICD-10-CM | POA: Diagnosis not present

## 2019-09-05 DIAGNOSIS — Z79891 Long term (current) use of opiate analgesic: Secondary | ICD-10-CM

## 2019-09-05 DIAGNOSIS — Z9841 Cataract extraction status, right eye: Secondary | ICD-10-CM

## 2019-09-05 DIAGNOSIS — Z9842 Cataract extraction status, left eye: Secondary | ICD-10-CM

## 2019-09-05 LAB — URINALYSIS, ROUTINE W REFLEX MICROSCOPIC
Bilirubin Urine: NEGATIVE
Glucose, UA: NEGATIVE mg/dL
Ketones, ur: NEGATIVE mg/dL
Nitrite: POSITIVE — AB
Protein, ur: 30 mg/dL — AB
Specific Gravity, Urine: 1.013 (ref 1.005–1.030)
WBC, UA: >50 WBC/hpf — ABNORMAL HIGH (ref 0–5)
pH: 5 (ref 5.0–8.0)

## 2019-09-05 LAB — COMPREHENSIVE METABOLIC PANEL
ALT: 31 U/L (ref 0–44)
AST: 22 U/L (ref 15–41)
Albumin: 2.8 g/dL — ABNORMAL LOW (ref 3.5–5.0)
Alkaline Phosphatase: 70 U/L (ref 38–126)
Anion gap: 8 (ref 5–15)
BUN: 74 mg/dL — ABNORMAL HIGH (ref 8–23)
CO2: 19 mmol/L — ABNORMAL LOW (ref 22–32)
Calcium: 8.8 mg/dL — ABNORMAL LOW (ref 8.9–10.3)
Chloride: 125 mmol/L — ABNORMAL HIGH (ref 98–111)
Creatinine, Ser: 1.52 mg/dL — ABNORMAL HIGH (ref 0.61–1.24)
GFR calc Af Amer: 44 mL/min — ABNORMAL LOW (ref >60)
GFR calc non Af Amer: 38 mL/min — ABNORMAL LOW (ref >60)
Glucose, Bld: 116 mg/dL — ABNORMAL HIGH (ref 70–99)
Potassium: 3.5 mmol/L (ref 3.5–5.1)
Sodium: 152 mmol/L — ABNORMAL HIGH (ref 135–145)
Total Bilirubin: 0.6 mg/dL (ref 0.3–1.2)
Total Protein: 7.5 g/dL (ref 6.5–8.1)

## 2019-09-05 LAB — CBC
HCT: 34 % — ABNORMAL LOW (ref 39.0–52.0)
Hemoglobin: 9.9 g/dL — ABNORMAL LOW (ref 13.0–17.0)
MCH: 30.2 pg (ref 26.0–34.0)
MCHC: 29.1 g/dL — ABNORMAL LOW (ref 30.0–36.0)
MCV: 103.7 fL — ABNORMAL HIGH (ref 80.0–100.0)
Platelets: 255 10*3/uL (ref 150–400)
RBC: 3.28 MIL/uL — ABNORMAL LOW (ref 4.22–5.81)
RDW: 15.6 % — ABNORMAL HIGH (ref 11.5–15.5)
WBC: 9.8 10*3/uL (ref 4.0–10.5)
nRBC: 0 % (ref 0.0–0.2)

## 2019-09-05 LAB — LACTIC ACID, PLASMA: Lactic Acid, Venous: 1.5 mmol/L (ref 0.5–1.9)

## 2019-09-05 MED ORDER — FERROUS SULFATE 325 (65 FE) MG PO TABS
325.0000 mg | ORAL_TABLET | Freq: Two times a day (BID) | ORAL | Status: DC
  Administered 2019-09-06 – 2019-09-14 (×12): 325 mg via ORAL
  Filled 2019-09-05 (×11): qty 1

## 2019-09-05 MED ORDER — METOPROLOL TARTRATE 25 MG PO TABS
25.0000 mg | ORAL_TABLET | Freq: Two times a day (BID) | ORAL | Status: DC
  Filled 2019-09-05: qty 1

## 2019-09-05 MED ORDER — ASPIRIN 81 MG PO CHEW
81.0000 mg | CHEWABLE_TABLET | Freq: Every day | ORAL | Status: DC
  Administered 2019-09-06 – 2019-09-14 (×7): 81 mg via ORAL
  Filled 2019-09-05 (×6): qty 1

## 2019-09-05 MED ORDER — CYANOCOBALAMIN 500 MCG PO TABS
500.0000 ug | ORAL_TABLET | Freq: Every day | ORAL | Status: DC
  Administered 2019-09-06 – 2019-09-14 (×7): 500 ug via ORAL
  Filled 2019-09-05 (×9): qty 1

## 2019-09-05 MED ORDER — ASPIRIN 81 MG PO CHEW: 81.0000 mg | CHEWABLE_TABLET | Freq: Every day | ORAL | Status: DC

## 2019-09-05 MED ORDER — DEXTROSE-NACL 5-0.45 % IV SOLN
INTRAVENOUS | Status: AC
  Administered 2019-09-05 – 2019-09-06 (×2): via INTRAVENOUS

## 2019-09-05 MED ORDER — SODIUM CHLORIDE 0.9 % IV SOLN
1.0000 g | INTRAVENOUS | Status: DC
  Administered 2019-09-06 – 2019-09-10 (×5): 1 g via INTRAVENOUS
  Filled 2019-09-05: qty 1
  Filled 2019-09-05: qty 10
  Filled 2019-09-05 (×2): qty 1
  Filled 2019-09-05: qty 10
  Filled 2019-09-05 (×2): qty 1

## 2019-09-05 MED ORDER — FINASTERIDE 5 MG PO TABS
5.0000 mg | ORAL_TABLET | Freq: Every day | ORAL | Status: DC
  Administered 2019-09-06 – 2019-09-14 (×7): 5 mg via ORAL
  Filled 2019-09-05 (×6): qty 1

## 2019-09-05 MED ORDER — HEPARIN SODIUM (PORCINE) 5000 UNIT/ML IJ SOLN
5000.0000 [IU] | Freq: Three times a day (TID) | INTRAMUSCULAR | Status: DC
  Administered 2019-09-06 – 2019-09-14 (×24): 5000 [IU] via SUBCUTANEOUS
  Filled 2019-09-05 (×23): qty 1

## 2019-09-05 MED ORDER — ACETAMINOPHEN 650 MG RE SUPP: 650.0000 mg | Freq: Four times a day (QID) | RECTAL | Status: DC | PRN

## 2019-09-05 MED ORDER — SODIUM CHLORIDE 0.9 % IV BOLUS
1000.0000 mL | Freq: Once | INTRAVENOUS | Status: AC
  Administered 2019-09-05: 1000 mL via INTRAVENOUS

## 2019-09-05 MED ORDER — ACETAMINOPHEN 325 MG PO TABS: 650.0000 mg | ORAL_TABLET | Freq: Four times a day (QID) | ORAL | Status: DC | PRN

## 2019-09-05 NOTE — ED Notes (Signed)
ED TO INPATIENT HANDOFF REPORT  Name/Age/Gender Zachary Obrien 85 y.o. male  Code Status    Code Status Orders  (From admission, onward)         Start     Ordered   09/05/19 2013  Do not attempt resuscitation (DNR)  Continuous    Question Answer Comment  In the event of cardiac or respiratory ARREST Do not call a "code blue"   In the event of cardiac or respiratory ARREST Do not perform Intubation, CPR, defibrillation or ACLS   In the event of cardiac or respiratory ARREST Use medication by any route, position, wound care, and other measures to relive pain and suffering. May use oxygen, suction and manual treatment of airway obstruction as needed for comfort.      09/05/19 2014        Code Status History    Date Active Date Inactive Code Status Order ID Comments User Context   03/18/2019 2156 03/22/2019 1911 DNR [id number]  Shela Leff, MD ED   11/08/2015 1948 11/10/2015 1718 DNR [id number]  Gennaro Africa, MD Inpatient   11/08/2015 1930 11/08/2015 1948 Full Code [id number]  Gennaro Africa, MD Inpatient   Advance Care Planning Activity    Advance Directive Documentation     Most Recent Value  Type of Advance Directive  Out of facility DNR (pink MOST or yellow form)  Pre-existing out of facility DNR order (yellow form or pink MOST form)  Yellow form placed in chart (order not valid for inpatient use), Pink MOST form placed in chart (order not valid for inpatient use)  "MOST" Form in Place?  -      Home/SNF/Other Nursing Home  Chief Complaint general weakness/ choking  Level of Care/Admitting Diagnosis ED Disposition    ED Disposition Condition [place]: [place] [100102]  Level of Care: Med-Surg [16]  Covid Evaluation: Asymptomatic Screening Protocol (No Symptoms)  [facility]: [id number]  Admitting Physician: Lenore Cordia [id number]  Attending Physician: Lenore Cordia [id number]  PT Class (Do  Not Modify): Observation [104]  PT Acc Code (Do Not Modify): Observation [10022]       Medical History Past Medical History:  Diagnosis Date  . 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 Bladder cancer ([place])   . Bladder neoplasm   . 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 BPH (benign prostatic hyperplasia)   . Dermatophytosis of nail   . No pertinent past medical history   . Osteoarthritis     Allergies No Known Allergies  IV Location/Drains/Wounds Patient Lines/Drains/Airways Status   Active Line/Drains/Airways    Name:   Placement date:   Placement time:   Site:   Days:   Peripheral IV 09/05/19 Right;Medial Arm   09/05/19    1659    Arm   less than 1   External Urinary Catheter   03/18/19    2300    -   171   Wound / Incision (Open or Dehisced) 03/19/19 Laceration   03/19/19    2300    -   170          Labs/Imaging Results for orders placed or performed during the hospital encounter of 09/05/19 (from the past 48 hour(s))  CBC     Status: Abnormal   Collection Time: 09/05/19  5:10 PM  Result Value Ref Range   WBC 9.8 4.0 - 10.5 K/uL  RBC 3.28 (L) 4.22 - 5.81 MIL/uL   Hemoglobin 9.9 (L) 13.0 - 17.0 g/dL   HCT 34.0 (L) 39.0 - 52.0 %   MCV 103.7 (H) 80.0 - 100.0 fL   MCH 30.2 26.0 - 34.0 pg   MCHC 29.1 (L) 30.0 - 36.0 g/dL   RDW 15.6 (H) 11.5 - 15.5 %   Platelets 255 150 - 400 K/uL   nRBC 0.0 0.0 - 0.2 %    Comment: Performed at [facility], [place] [address] [place], [place] [address]  Comprehensive metabolic panel     Status: Abnormal   Collection Time: 09/05/19  5:10 PM  Result Value Ref Range   Sodium 152 (H) 135 - 145 mmol/L   Potassium 3.5 3.5 - 5.1 mmol/L   Chloride 125 (H) 98 - 111 mmol/L   CO2 19 (L) 22 - 32 mmol/L   Glucose, Bld 116 (H) 70 - 99 mg/dL   BUN 74 (H) 8 - 23 mg/dL   Creatinine, Ser 1.52 (H) 0.61 - 1.24 mg/dL   Calcium 8.8 (L) 8.9 - 10.3 mg/dL   Total Protein 7.5 6.5 - 8.1 g/dL   Albumin 2.8  (L) 3.5 - 5.0 g/dL   AST 22 15 - 41 U/L   ALT 31 0 - 44 U/L   Alkaline Phosphatase 70 38 - 126 U/L   Total Bilirubin 0.6 0.3 - 1.2 mg/dL   GFR calc non Af Amer 38 (L) >60 mL/min   GFR calc Af Amer 44 (L) >60 mL/min   Anion gap 8 5 - 15    Comment: Performed at [facility], [place] [address] [place], [place] [address]  Lactic acid, plasma     Status: None   Collection Time: 09/05/19  5:11 PM  Result Value Ref Range   Lactic Acid, Venous 1.5 0.5 - 1.9 mmol/L    Comment: Performed at [facility], [place] [address] [place], [place] [address]  Urinalysis, Routine w reflex microscopic     Status: Abnormal   Collection Time: 09/05/19  6:00 PM  Result Value Ref Range   Color, Urine YELLOW YELLOW   APPearance CLOUDY (A) CLEAR   Specific Gravity, Urine 1.013 1.005 - 1.030   pH 5.0 5.0 - 8.0   Glucose, UA NEGATIVE NEGATIVE mg/dL   Hgb urine dipstick SMALL (A) NEGATIVE   Bilirubin Urine NEGATIVE NEGATIVE   Ketones, ur NEGATIVE NEGATIVE mg/dL   Protein, ur 30 (A) NEGATIVE mg/dL   Nitrite POSITIVE (A) NEGATIVE   Leukocytes,Ua LARGE (A) NEGATIVE   RBC / HPF 6-10 0 - 5 RBC/hpf   WBC, UA >50 (H) 0 - 5 WBC/hpf   Bacteria, UA MANY (A) NONE SEEN   Squamous Epithelial / LPF 0-5 0 - 5   WBC Clumps PRESENT     Comment: Performed at [facility], [place] [address] [place], [place] [address]   Dg Chest Port 1 View  Result Date: 09/05/2019 CLINICAL DATA:  85 year old male with history of choking on food. Possible aspiration. EXAM: PORTABLE CHEST 1 VIEW COMPARISON:  Chest x-ray 03/18/2019. FINDINGS: Lung volumes are low. No consolidative airspace disease. No pleural effusions. No pneumothorax. No pulmonary nodule or mass noted. Mild cardiomegaly. The patient is rotated to the right on today's exam, resulting in distortion of the mediastinal contours and reduced diagnostic sensitivity and specificity for mediastinal pathology. Aortic atherosclerosis.  IMPRESSION: 1. Low lung volumes without radiographic evidence of acute cardiopulmonary disease. 2. Aortic atherosclerosis.  Electronically Signed   By: Vinnie Langton M.D.   On: 09/05/2019 16:47    Pending Labs Unresulted Labs (From admission, onward)    Start     Ordered   09/06/19 0500  CBC  Tomorrow morning,   R     09/05/19 2014   09/06/19 [id number]  Basic metabolic panel  Tomorrow morning,   R     09/05/19 2014   09/05/19 1954  Culture, Urine  Add-on,   AD     09/05/19 1953   09/05/19 1601  Culture, blood (single) w Reflex to ID Panel  ONCE - STAT,   STAT     09/05/19 1600   09/05/19 1601  SARS CORONAVIRUS 2 (TAT 6-24 HRS) Nasopharyngeal Nasopharyngeal Swab  (Asymptomatic/Tier 2 Patients Labs)  Once,   STAT    Question Answer Comment  Is this test for diagnosis or screening Screening   Symptomatic for COVID-19 as defined by CDC No   Hospitalized for COVID-19 No   Admitted to ICU for COVID-19 No   Previously tested for COVID-19 Yes   Resident in a congregate (group) care setting Unknown   Employed in healthcare setting Unknown      09/05/19 1600          Vitals/Pain Today's Vitals   09/05/19 1600 09/05/19 1800 09/05/19 1900 09/05/19 2000  BP: 120/72 118/60 105/90 121/87  Pulse: (!) 117 (!) 142 (!) 139 (!) 120  Resp: (!) 23 20 (!) 24 14  Temp:      TempSrc:      SpO2: 100% 94% 99% 100%    Isolation Precautions No active isolations  Medications Medications  heparin injection 5,000 Units (has no administration in time range)  dextrose 5 %-0.45 % sodium chloride infusion ( Intravenous New Bag/Given 09/05/19 2044)  acetaminophen (TYLENOL) tablet 650 mg (has no administration in time range)    Or  acetaminophen (TYLENOL) suppository 650 mg (has no administration in time range)  metoprolol tartrate (LOPRESSOR) tablet 25 mg (has no administration in time range)  aspirin chewable tablet 81 mg (has no administration in time range)  ferrous sulfate tablet 325 mg (has no  administration in time range)  vitamin B-12 (CYANOCOBALAMIN) tablet 500 mcg (has no administration in time range)  finasteride (PROSCAR) tablet 5 mg (has no administration in time range)  cefTRIAXone (ROCEPHIN) 1 g in sodium chloride 0.9 % 100 mL IVPB (has no administration in time range)  sodium chloride 0.9 % bolus 1,000 mL (0 mLs Intravenous Stopped 09/05/19 2045)    Mobility non-ambulatory

## 2019-09-05 NOTE — ED Notes (Signed)
ED TO INPATIENT HANDOFF REPORT  Name/Age/Gender Zachary Obrien 85 y.o. male  Code Status    Code Status Orders  (From admission, onward)         Start     Ordered   09/05/19 2013  Do not attempt resuscitation (DNR)  Continuous    Question Answer Comment  In the event of cardiac or respiratory ARREST Do not call a "code blue"   In the event of cardiac or respiratory ARREST Do not perform Intubation, CPR, defibrillation or ACLS   In the event of cardiac or respiratory ARREST Use medication by any route, position, wound care, and other measures to relive pain and suffering. May use oxygen, suction and manual treatment of airway obstruction as needed for comfort.      09/05/19 2014        Code Status History    Date Active Date Inactive Code Status Order ID Comments User Context   03/18/2019 2156 03/22/2019 1911 DNR [id number]  Shela Leff, MD ED   11/08/2015 1948 11/10/2015 1718 DNR [id number]  Gennaro Africa, MD Inpatient   11/08/2015 1930 11/08/2015 1948 Full Code [id number]  Gennaro Africa, MD Inpatient   Advance Care Planning Activity    Advance Directive Documentation     Most Recent Value  Type of Advance Directive  Out of facility DNR (pink MOST or yellow form)  Pre-existing out of facility DNR order (yellow form or pink MOST form)  Yellow form placed in chart (order not valid for inpatient use), Pink MOST form placed in chart (order not valid for inpatient use)  "MOST" Form in Place?  -      Home/SNF/Other Nursing Home  Chief Complaint general weakness/ choking  Level of Care/Admitting Diagnosis ED Disposition    ED Disposition Condition [place]: [place] [100102]  Level of Care: Med-Surg [16]  Covid Evaluation: Asymptomatic Screening Protocol (No Symptoms)  Diagn[facility]: [id number]  Admitting Physician: Lenore Cordia [id number]  Attending Physician: Lenore Cordia [id number]  PT Class (Do  Not Modify): Observation [104]  PT Acc Code (Do Not Modify): Observation [10022]       Medical History Past Medical History:  Diagnosis Date  . 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 Bladder cancer ([place])   . Bladder neoplasm   . 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 BPH (benign prostatic hyperplasia)   . Dermatophytosis of nail   . No pertinent past medical history   . Osteoarthritis     Allergies No Known Allergies  IV Location/Drains/Wounds Patient Lines/Drains/Airways Status   Active Line/Drains/Airways    Name:   Placement date:   Placement time:   Site:   Days:   Peripheral IV 09/05/19 Right;Medial Arm   09/05/19    1659    Arm   less than 1   External Urinary Catheter   03/18/19    2300    -   171   Wound / Incision (Open or Dehisced) 03/19/19 Laceration   03/19/19    2300    -   170          Labs/Imaging Results for orders placed or performed during the hospital encounter of 09/05/19 (from the past 48 hour(s))  CBC     Status: Abnormal   Collection Time: 09/05/19  5:10 PM  Result Value Ref Range   WBC 9.8 4.0 - 10.5 K/uL  RBC 3.28 (L) 4.22 - 5.81 MIL/uL   Hemoglobin 9.9 (L) 13.0 - 17.0 g/dL   HCT 34.0 (L) 39.0 - 52.0 %   MCV 103.7 (H) 80.0 - 100.0 fL   MCH 30.2 26.0 - 34.0 pg   MCHC 29.1 (L) 30.0 - 36.0 g/dL   RDW 15.6 (H) 11.5 - 15.5 %   Platelets 255 150 - 400 K/uL   nRBC 0.0 0.0 - 0.2 %    Comment: Performed at [facility], [place] [address], [place], [place] [address]  Comprehensive metabolic panel     Status: Abnormal   Collection Time: 09/05/19  5:10 PM  Result Value Ref Range   Sodium 152 (H) 135 - 145 mmol/L   Potassium 3.5 3.5 - 5.1 mmol/L   Chloride 125 (H) 98 - 111 mmol/L   CO2 19 (L) 22 - 32 mmol/L   Glucose, Bld 116 (H) 70 - 99 mg/dL   BUN 74 (H) 8 - 23 mg/dL   Creatinine, Ser 1.52 (H) 0.61 - 1.24 mg/dL   Calcium 8.8 (L) 8.9 - 10.3 mg/dL   Total Protein 7.5 6.5 - 8.1 g/dL   Albumin 2.8  (L) 3.5 - 5.0 g/dL   AST 22 15 - 41 U/L   ALT 31 0 - 44 U/L   Alkaline Phosphatase 70 38 - 126 U/L   Total Bilirubin 0.6 0.3 - 1.2 mg/dL   GFR calc non Af Amer 38 (L) >60 mL/min   GFR calc Af Amer 44 (L) >60 mL/min   Anion gap 8 5 - 15    Comment: Performed at [facility], [place] [address], [place], [place] [address]  Lactic acid, plasma     Status: None   Collection Time: 09/05/19  5:11 PM  Result Value Ref Range   Lactic Acid, Venous 1.5 0.5 - 1.9 mmol/L    Comment: Performed at [facility], [place] [address] [place], [place] [address]  Urinalysis, Routine w reflex microscopic     Status: Abnormal   Collection Time: 09/05/19  6:00 PM  Result Value Ref Range   Color, Urine YELLOW YELLOW   APPearance CLOUDY (A) CLEAR   Specific Gravity, Urine 1.013 1.005 - 1.030   pH 5.0 5.0 - 8.0   Glucose, UA NEGATIVE NEGATIVE mg/dL   Hgb urine dipstick SMALL (A) NEGATIVE   Bilirubin Urine NEGATIVE NEGATIVE   Ketones, ur NEGATIVE NEGATIVE mg/dL   Protein, ur 30 (A) NEGATIVE mg/dL   Nitrite POSITIVE (A) NEGATIVE   Leukocytes,Ua LARGE (A) NEGATIVE   RBC / HPF 6-10 0 - 5 RBC/hpf   WBC, UA >50 (H) 0 - 5 WBC/hpf   Bacteria, UA MANY (A) NONE SEEN   Squamous Epithelial / LPF 0-5 0 - 5   WBC Clumps PRESENT     Comment: Performed at [facility], [place] [address], [place], [place] [address]   Dg Chest Port 1 View  Result Date: 09/05/2019 CLINICAL DATA:  85 year old male with history of choking on food. Possible aspiration. EXAM: PORTABLE CHEST 1 VIEW COMPARISON:  Chest x-ray 03/18/2019. FINDINGS: Lung volumes are low. No consolidative airspace disease. No pleural effusions. No pneumothorax. No pulmonary nodule or mass noted. Mild cardiomegaly. The patient is rotated to the right on today's exam, resulting in distortion of the mediastinal contours and reduced diagnostic sensitivity and specificity for mediastinal pathology. Aortic atherosclerosis.  IMPRESSION: 1. Low lung volumes without radiographic evidence of acute cardiopulmonary disease. 2. Aortic atherosclerosis.  Electronically Signed   By: Vinnie Langton M.D.   On: 09/05/2019 16:47    Pending Labs Unresulted Labs (From admission, onward)    Start     Ordered   09/06/19 0500  CBC  Tomorrow morning,   R     09/05/19 2014   09/06/19 [id number]  Basic metabolic panel  Tomorrow morning,   R     09/05/19 2014   09/05/19 1954  Culture, Urine  Add-on,   AD     09/05/19 1953   09/05/19 1601  Culture, blood (single) w Reflex to ID Panel  ONCE - STAT,   STAT     09/05/19 1600   09/05/19 1601  SARS CORONAVIRUS 2 (TAT 6-24 HRS) Nasopharyngeal Nasopharyngeal Swab  (Asymptomatic/Tier 2 Patients Labs)  Once,   STAT    Question Answer Comment  Is this test for diagnosis or screening Screening   Symptomatic for COVID-19 as defined by CDC No   Hospitalized for COVID-19 No   Admitted to ICU for COVID-19 No   Previously tested for COVID-19 Yes   Resident in a congregate (group) care setting Unknown   Employed in healthcare setting Unknown      09/05/19 1600          Vitals/Pain Today's Vitals   09/05/19 1900 09/05/19 2000 09/05/19 2100 09/05/19 2200  BP: 105/90 121/87 132/82 121/80  Pulse: (!) 139 (!) 120 (!) 116 (!) 140  Resp: (!) 24 14 (!) 24 (!) 26  Temp:      TempSrc:      SpO2: 99% 100% 97% 96%    Isolation Precautions No active isolations  Medications Medications  heparin injection 5,000 Units (has no administration in time range)  dextrose 5 %-0.45 % sodium chloride infusion ( Intravenous New Bag/Given 09/05/19 2044)  acetaminophen (TYLENOL) tablet 650 mg (has no administration in time range)    Or  acetaminophen (TYLENOL) suppository 650 mg (has no administration in time range)  metoprolol tartrate (LOPRESSOR) tablet 25 mg (has no administration in time range)  ferrous sulfate tablet 325 mg (has no administration in time range)  vitamin B-12 (CYANOCOBALAMIN) tablet  500 mcg (has no administration in time range)  finasteride (PROSCAR) tablet 5 mg (has no administration in time range)  cefTRIAXone (ROCEPHIN) 1 g in sodium chloride 0.9 % 100 mL IVPB (has no administration in time range)  aspirin chewable tablet 81 mg (has no administration in time range)  sodium chloride 0.9 % bolus 1,000 mL (0 mLs Intravenous Stopped 09/05/19 2045)    Mobility non-ambulatory

## 2019-09-05 NOTE — ED Provider Notes (Signed)
[facility] Emergency Department Provider Note MRN:  [id number]  Arrival date & time: 09/05/19     Chief Complaint   Weakness   History of Present Illness   Zachary Obrien is a 85 y.o. year-old male with a history of advanced age presenting to the ED with chief complaint of weakness.  I was unable to obtain an accurate HPI, PMH, or ROS due to the patient's dementia/altered mental status.  Level 5 caveat.  Unknown mental status baseline, no report from EMS, patient has paperwork at bedside demonstrating hypernatremia noted today.  Review of Systems  A complete 10 system review of systems was obtained and all systems are negative except as noted in the HPI and PMH.   Patient's Health History    Past Medical History:  Diagnosis Date  . 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 Bladder cancer ([place])   . Bladder neoplasm   . 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 BPH (benign prostatic hyperplasia)   . Dermatophytosis of nail   . No pertinent past medical history   . Osteoarthritis     Past Surgical History:  Procedure Laterality Date  . bilatera lens replacement sfor cataracts  age 64  . CYSTOSCOPY  09/13/2012   Procedure: CYSTOSCOPY;  Surgeon: Molli Hazard, MD;  Location: [facility];  Service: Urology;  Laterality: N/A;  . CYSTOSCOPY WITH BIOPSY  10/25/2012   Procedure: CYSTOSCOPY WITH BIOPSY;  Surgeon: Molli Hazard, MD;  Location: [facility];  Service: Urology;  Laterality: N/A;  . detached retina surgery left eye  age 61  . EYE SURGERY  age 18   partial cornea tranplants  . hydrocele surgery  age 76  . JOINT REPLACEMENT  2010   right knee replacement  . TONSILLECTOMY  age 54  . TRANSURETHRAL RESECTION OF BLADDER TUMOR  09/13/2012   Procedure: TRANSURETHRAL RESECTION OF BLADDER TUMOR (TURBT);  Surgeon: Molli Hazard, MD;  Location: [facility];  Service: Urology;  Laterality: N/A;  fulguration of bladder tumor     No family history on file.  Social History   Socioeconomic History  . Marital status: Widowed    Spouse name: Not on file  . Number of children: Not on file  . Years of education: Not on file  . Highest education level: Not on file  Occupational History  . Not on file  Social Needs  . Financial resource strain: Not on file  . Food insecurity    Worry: Not on file    Inability: Not on file  . Transportation needs    Medical: Not on file    Non-medical: Not on file  Tobacco Use  . Smoking status: Former Smoker    Packs/day: 2.00    Years: 20.00    Pack years: 40.00    Types: Cigarettes    Quit date: 11/08/1965    Years since quitting: 53.8  . Smokeless tobacco: Never Used  Substance and Sexual Activity  . Alcohol use: Yes    Alcohol/week: 2.0 standard drinks    Types: 2 Glasses of wine per week  . Drug use: No  . Sexual activity: Not on file  Lifestyle  . Physical activity    Days per week: Not on file    Minutes per session: Not on file  . Stress: Not on file  Relationships  . Social Herbalist on phone: Not on file    Gets  together: Not on file    Attends religious service: Not on file    Active member of club or organization: Not on file    Attends meetings of clubs or organizations: Not on file    Relationship status: Not on file  . Intimate partner violence    Fear of current or ex partner: Not on file    Emotionally abused: Not on file    Physically abused: Not on file    Forced sexual activity: Not on file  Other Topics Concern  . Not on file  Social History Narrative  . Not on file     Physical Exam  Vital Signs and Nursing Notes reviewed Vitals:   09/05/19 1535 09/05/19 1600  BP: 139/79 120/72  Pulse: (!) 118 (!) 117  Resp: (!) 21 (!) 23  Temp: 98.3 F (36.8 C)   SpO2: 96% 100%    CONSTITUTIONAL: Chronically ill-appearing, NAD NEURO: Somnolent, wakes to voice, incoherent speech EYES:  eyes equal and reactive ENT/NECK:  no  LAD, no JVD CARDIO: Tachycardic rate, well-perfused, normal S1 and S2 PULM:  CTAB no wheezing or rhonchi GI/GU:  normal bowel sounds, non-distended, non-tender MSK/SPINE:  No gross deformities, no edema SKIN:  no rash, atraumatic PSYCH: Unable to assess  Diagnostic and Interventional Summary    EKG Interpretation  Date/Time:  Wednesday September 05 2019 16:34:18 EDT Ventricular Rate:  136 PR Interval:    QRS Duration: 93 QT Interval:  329 QTC Calculation: 495 R Axis:   -22 Text Interpretation: Sinus tachycardia with irregular rate Prolonged PR interval Inferior infarct, old Confirmed by Gerlene Fee [phone]) on 09/05/2019 4:40:31 PM      Labs Reviewed  CBC - Abnormal; Notable for the following components:      Result Value   RBC 3.28 (*)    Hemoglobin 9.9 (*)    HCT 34.0 (*)    MCV 103.7 (*)    MCHC 29.1 (*)    RDW 15.6 (*)    All other components within normal limits  COMPREHENSIVE METABOLIC PANEL - Abnormal; Notable for the following components:   Sodium 152 (*)    Chloride 125 (*)    CO2 19 (*)    Glucose, Bld 116 (*)    BUN 74 (*)    Creatinine, Ser 1.52 (*)    Calcium 8.8 (*)    Albumin 2.8 (*)    GFR calc non Af Amer 38 (*)    GFR calc Af Amer 44 (*)    All other components within normal limits  CULTURE, BLOOD (SINGLE)  SARS CORONAVIRUS 2 (TAT 6-24 HRS)  LACTIC ACID, PLASMA  URINALYSIS, ROUTINE W REFLEX MICROSCOPIC    DG Chest Port 1 View  Final Result      Medications  sodium chloride 0.9 % bolus 1,000 mL (1,000 mLs Intravenous New Bag/Given 09/05/19 1713)     Procedures  /  Critical Care Procedures  ED Course and Medical Decision Making  I have reviewed the triage vital signs and the nursing notes.  Pertinent labs & imaging results that were available during my care of the patient were reviewed by me and considered in my medical decision making (see below for details).  Patient is DNR/DNI, here with weakness, seems to be contributed to  hyponatremia based on outside records, will further evaluate here, anticipating admission.  Labs confirm hypernatremia with mild AKI.  Continued mild tachycardia, suspect dehydration.  Will admit to hospitalist service.  Barth Kirks. [place] Small, [facility]  Emergency [place] [email or web address]  Final Clinical Impressions(s) / ED Diagnoses     ICD-10-CM   1. Dehydration  E86.0   2. Weakness  R53.1 DG Chest [facility] 1 View    DG Chest Port 1 View  3. Hypernatremia  E87.0     ED Discharge Orders    None      Discharge Instructions Discussed with and Provided to Patient: Discharge Instructions   None       Maudie Flakes, MD 09/05/19 1806

## 2019-09-05 NOTE — Progress Notes (Addendum)
Oct 28th, 2020 [facility] Palliative Care Consult Note Telephone: [phone]  Fax: [phone]   Due to the current COVID-19 infection/crises, facility is not allowing providers to enter. The patient has given his verbal consent for, a provider visit via telehealth, from my office. HIPPA policies of confidentially were discussed.   PATIENT NAME: Zachary Obrien DOB: 1921/06/07 MRN: [id number] [facility] [id number] (admit date 6/18/20020)   PRIMARY CARE PROVIDER:   Josetta Huddle, MD [address] [place], [place] [address]   REFERRING PROVIDER:  Suzzanne Cloud PA-C ([employer] [phone] Dr. Wenda Low   Responsible Person: (son) Nicolette Bang) [employer] [phone], DIL Trish Mage [phone] [email or web address]. Other son e-mail: [email or web address] Becky [phone]  ASSESSMENT / RECOMMENDATIONS:  1. Goals of care/health assessment: Meeting with facility social worker Al Pimple, patient's 2 sons Richardson Landry and [name], and patient's daughter in law (? daughter) Jacqlyn Larsen.   A. Decline in cognitive / functional status thought r/t decreased social stimulation (COVID restrictions); possible component delirium (recent I/O cath 94ml for urinary retention) from UTI?   Hoyle Sauer plans to check if UA/C&S ordered. If not, plans to pursue.    Seth Bake (facility activities director) to contact son [name] to set up weekly window visits.    *At the time of first visit, [name] will bring in an Alexa and sync it to the facility wireless system. Thereafter it will be in patient's room to provide some entertainment (read books, music, radio, etc).    *Also on this first visit, Richardson Landry with arrange with Seth Bake a schedule for patient to be assisted to make a phone call, twice a week, from patient's I-Phone which is in his room.     -PT/OT consult. Hoyle Sauer will follow up on this, and have that rehabilitative team contact Richardson Landry with their assessment  findings.   -Hoyle Sauer will have facility point person (? Myra) call Richardson Landry on a weekly basis to update on current facility COVID restrictions.   Barnett Applebaum (wound care nurse) will call Richardson Landry on a weekly basis to update status of sacral and heel wounds.   2. Directives: DNR / MOST on chart per nursing report.     3. Follow up Palliative Care Visit: In 1-2 months.   I spent 60 minutes providing this consultation from 11:30 am to 12:30 am. More than 50% of the time in this consultation was spent coordinating communication.    HISTORY OF PRESENT ILLNESS: Mr. Zachary Obrien is a 85 yo male [facility] with medical history of bladder cancer (diagnosed 2013; resected) , iron deficiency anemia, HTN, pressure ulcer L heel stage 3,  BPH, dysphagia,  and osteoarthritis.  Hospital adm 5/10-5/14/2020 treatment of head injury s/p fall, and for UTI. He was found to be COVID positive. AKI. Incidental finding of R lung base subpleural nodule (63mm). This is a follow up Palliative Care visit from Aug 28th. a Health Assessment Meeting (ZOOM).   CODE STATUS:  DNR on chart. MOST on chart: DNR/DNI, Antibiotics and IVFs if indicated.   PPS: 30%   HOSPICE ELIGIBILITY/DIAGNOSIS: TBD   PAST MEDICAL HISTORY:      Past Medical History:  Diagnosis Date  . Atypical nevus 11/15/2012    severe--upperback  . Bladder cancer ([place])    . Bladder neoplasm    . 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 BPH (benign prostatic hyperplasia)    . Dermatophytosis of nail    .  No pertinent past medical history    . Osteoarthritis      SOCIAL HX:  Social History         Tobacco Use  . Smoking status: Former Smoker      Packs/day: 2.00      Years: 20.00      Pack years: 40.00      Types: Cigarettes      Quit date: 11/08/1965      Years since quitting: 53.6  . Smokeless tobacco: Never Used  Substance Use Topics  . Alcohol use: Yes      Alcohol/week: 2.0 standard drinks      Types: 2 Glasses of  wine per week     ALLERGIES: No Known Allergies   PERTINENT MEDICATIONS:      Outpatient Encounter Medications as of 07/06/2019  Medication Sig  . acetaminophen (TYLENOL) 325 MG tablet Take 2 tablets (650 mg total) by mouth every 6 (six) hours as needed for mild pain, fever or headache (fever >/= 101). (Patient taking differently: Take 650 mg by mouth daily. And q 6h prn)  . aspirin 81 MG chewable tablet Chew 81 mg by mouth daily.  . cholecalciferol (VITAMIN D) 1000 units tablet Take 1,000 Units by mouth daily.  Marland Kitchen docusate sodium (COLACE) 100 MG capsule Take 100 mg by mouth daily as needed for mild constipation.  . ferrous sulfate 325 (65 FE) MG tablet Take 325 mg by mouth 2 (two) times a day.   . finasteride (PROSCAR) 5 MG tablet Take 5 mg by mouth daily.   . metoprolol tartrate (LOPRESSOR) 25 MG tablet Take 25 mg by mouth 2 (two) times daily.  . vitamin B-12 (CYANOCOBALAMIN) 500 MCG tablet Take 500 mcg by mouth daily.   . [DISCONTINUED] vitamin C (VITAMIN C) 500 MG tablet Take 1 tablet (500 mg total) by mouth 2 (two) times daily. Please take for 10 days  . [DISCONTINUED] zinc sulfate 220 (50 Zn) MG capsule Take 1 capsule (220 mg total) by mouth daily. Please take for 10 days.    No facility-administered encounter medications on file as of 07/06/2019.      PHYSICAL EXAM:    Physical exam deferred d/t telehealth nature of visit, goals of care discussion with family and facility staff.   Julianne Handler, NP

## 2019-09-05 NOTE — H&P (Signed)
History and Physical    Zachary Obrien [id number] DOB: 1921-06-06 DOA: 09/05/2019  PCP: Josetta Huddle, MD  Patient coming from: [facility]  I have personally briefly reviewed patient's old medical records in [place]  Chief Complaint: Abnormal labs  HPI: Zachary Obrien is a 85 y.o. male with medical history significant for bladder cancer, BPH who presents to the ED from [facility] for evaluation of abnormal labs and possible episode of choking on food.  Unable to obtain history from patient due to altered mental status therefore entirety of history is obtained from EDP, chart review, and son by phone.  Patient was hospitalized earlier this year from 03/18/2019-03/22/2019 after a fall at home.  He was treated for E. coli UTI.  During hospitalization he was also incidentally noted to be SARS-CoV-2 test positive test performed 03/18/2019, at which time he was largely asymptomatic.  He was discharged to a skilled nursing facility and subsequently transferred to [facility].  Per son, patient has had significant functional and cognitive decline since the prior admission.  They have not been able to visit the patient as much as they like due to the ongoing COVID-19 restrictions.  He is reportedly significantly weaker and seems to be essentially bedridden.  He has intermittent episodes of confusion since last hospitalization, but is reportedly significantly more out of it the last 4 days.  He had reported similar symptoms with prior UTI.   Family has discussed with palliative care as their goals are to maximize quality of life and comfort.  They are agreeable to nonaggressive treatments such as IV fluids and antibiotics as needed.  His CODE STATUS is DNR.  ED Course:  Initial vitals showed BP 120/72, pulse 117, RR 23, temp 98.3 Fahrenheit, SPO2 100% on room air.  Labs are notable for sodium 152, potassium 3.5, BUN 74, creatinine 1.52, WBC 9.8,  hemoglobin 9.9, platelets 255,000, lactic acid 1.5.  Urinalysis showed positive nitrites, large leukocytes, 6-10 RBCs, >50 WBCs, many bacteria on microscopy.  Blood culture was obtained and pending.  Portable chest x-ray showed low lung volumes without focal consolidation or obvious pleural effusion.  Patient was given 1 L normal saline and the hospitalist service was consulted to admit for further evaluation and management.  Review of Systems:  Unable to obtain full review of systems due to patient's altered mental status.   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   left helix   BPH (benign prostatic hyperplasia)    Dermatophytosis of nail    No pertinent past medical history    Osteoarthritis     Past Surgical History:  Procedure Laterality Date   bilatera lens replacement sfor cataracts  age 31   CYSTOSCOPY  09/13/2012   Procedure: CYSTOSCOPY;  Surgeon: Molli Hazard, MD;  Location: [facility];  Service: Urology;  Laterality: N/A;   CYSTOSCOPY WITH BIOPSY  10/25/2012   Procedure: CYSTOSCOPY WITH BIOPSY;  Surgeon: Molli Hazard, MD;  Location: [facility];  Service: Urology;  Laterality: N/A;   detached retina surgery left eye  age 24   EYE SURGERY  age 78   partial cornea tranplants   hydrocele surgery  age 21   JOINT REPLACEMENT  2010   right knee replacement   TONSILLECTOMY  age 57   TRANSURETHRAL RESECTION OF BLADDER TUMOR  09/13/2012   Procedure: TRANSURETHRAL RESECTION OF BLADDER TUMOR (TURBT);  Surgeon: Molli Hazard, MD;  Location: [facility];  Service: Urology;  Laterality: N/A;  fulguration of bladder tumor    Social History:  reports that he quit smoking about 53 years ago. His smoking use included cigarettes. He has a 40.00 pack-year smoking history. He has never used smokeless tobacco. He reports  current alcohol use of about 2.0 standard drinks of alcohol per week. He reports that he does not use drugs.  No Known Allergies  Family History  Problem Relation Age of Onset   Heart disease Father    Hypertension Father      Prior to Admission medications   Medication Sig Start Date End Date Taking? Authorizing Provider  acetaminophen (TYLENOL) 325 MG tablet Take 2 tablets (650 mg total) by mouth every 6 (six) hours as needed for mild pain, fever or headache (fever >/= 101). Patient taking differently: Take 650 mg by mouth daily.  03/22/19  Yes Elgergawy, Silver Huguenin, MD  ascorbic acid (VITAMIN C) 500 MG tablet Take 500 mg by mouth daily.   Yes [provider]  aspirin 81 MG chewable tablet Chew 81 mg by mouth daily.   Yes [provider]  cholecalciferol (VITAMIN D) 1000 units tablet Take 1,000 Units by mouth daily.   Yes [provider]  docusate sodium (COLACE) 100 MG capsule Take 100 mg by mouth daily as needed for mild constipation.   Yes [provider]  ferrous sulfate 325 (65 FE) MG tablet Take 325 mg by mouth 2 (two) times a day.    Yes [provider]  finasteride (PROSCAR) 5 MG tablet Take 5 mg by mouth daily.    Yes [provider]  metoprolol tartrate (LOPRESSOR) 25 MG tablet Take 25 mg by mouth 2 (two) times daily.   Yes [provider]  Multiple Vitamins-Minerals (DAILY MULTIVITAMIN PO) Take 1 capsule by mouth. decuvi viite qd   Yes [provider]  Nutritional Supplements (NUTRITIONAL DRINK PO) Take 120 mLs by mouth 2 (two) times daily. Medpass 2.0   Yes [provider]  polyethylene glycol (MIRALAX / GLYCOLAX) 17 g packet Take 17 g by mouth daily.   Yes [provider]  senna (SENOKOT) 8.6 MG TABS tablet Take 1 tablet by mouth.   Yes [provider]  traMADol (ULTRAM) 50 MG tablet Take 50 mg by mouth every 8 (eight) hours as needed.   Yes [provider]  vitamin  B-12 (CYANOCOBALAMIN) 500 MCG tablet Take 500 mcg by mouth daily.    Yes [provider]    Physical Exam: Vitals:   09/05/19 2000 09/05/19 2100 09/05/19 2200 09/05/19 2300  BP: 121/87 132/82 121/80 112/88  Pulse: (!) 120 (!) 116 (!) 140 96  Resp: 14 (!) 24 (!) 26 (!) 23  Temp:      TempSrc:      SpO2: 100% 97% 96% 98%   Exam limited due to altered mental status and cooperation Constitutional: Elderly man resting in bed with head elevated Eyes: PERRL, lids and conjunctivae normal ENMT: Mucous membranes are moist. Posterior pharynx clear of any exudate or lesions. Neck: normal, supple, no masses. Respiratory: clear to auscultation anteriorly.  Normal respiratory effort. No accessory muscle use.  Cardiovascular: Tachycardic.  Trace bilateral lower extremity edema. 2+ pedal pulses. Abdomen: no tenderness, no masses palpated. No hepatosplenomegaly. Bowel sounds positive.  Musculoskeletal: no clubbing / cyanosis. No joint deformity upper and lower extremities.  Normal muscle tone.  Skin: Stasis dermatitis changes in the lower extremities, left heel wound Neurologic: Limited, moves upper extremities spontaneously but not following commands otherwise Psychiatric: Somnolent but awakens, will intermittently say help but otherwise not communicating.    Labs on Admission: I have personally reviewed following labs and imaging studies  CBC: Recent Labs  Lab 09/05/19 1710  WBC 9.8  HGB 9.9*  HCT 34.0*  MCV 103.7*  PLT 123456   Basic Metabolic Panel: Recent Labs  Lab 09/05/19 1710  NA 152*  K 3.5  CL 125*  CO2 19*  GLUCOSE 116*  BUN 74*  CREATININE 1.52*  CALCIUM 8.8*   GFR: CrCl cannot be calculated (Unknown ideal weight.). Liver Function Tests: Recent Labs  Lab 09/05/19 1710  AST 22  ALT 31  ALKPHOS 70  BILITOT 0.6  PROT 7.5  ALBUMIN 2.8*   No results for input(s): LIPASE, AMYLASE in the last 168 hours. No results for input(s): AMMONIA in the last 168  hours. Coagulation Profile: No results for input(s): INR, PROTIME in the last 168 hours. Cardiac Enzymes: No results for input(s): CKTOTAL, CKMB, CKMBINDEX, TROPONINI in the last 168 hours. BNP (last 3 results) No results for input(s): PROBNP in the last 8760 hours. HbA1C: No results for input(s): HGBA1C in the last 72 hours. CBG: No results for input(s): GLUCAP in the last 168 hours. Lipid Profile: No results for input(s): CHOL, HDL, LDLCALC, TRIG, CHOLHDL, LDLDIRECT in the last 72 hours. Thyroid Function Tests: No results for input(s): TSH, T4TOTAL, FREET4, T3FREE, THYROIDAB in the last 72 hours. Anemia Panel: No results for input(s): VITAMINB12, FOLATE, FERRITIN, TIBC, IRON, RETICCTPCT in the last 72 hours. Urine analysis:    Component Value Date/Time   COLORURINE YELLOW 09/05/2019 1800   APPEARANCEUR CLOUDY (A) 09/05/2019 1800   LABSPEC 1.013 09/05/2019 1800   PHURINE 5.0 09/05/2019 1800   GLUCOSEU NEGATIVE 09/05/2019 1800   HGBUR SMALL (A) 09/05/2019 1800   BILIRUBINUR NEGATIVE 09/05/2019 1800   KETONESUR NEGATIVE 09/05/2019 1800   PROTEINUR 30 (A) 09/05/2019 1800   UROBILINOGEN 0.2 01/10/2009 1100   NITRITE POSITIVE (A) 09/05/2019 1800   LEUKOCYTESUR LARGE (A) 09/05/2019 1800    Radiological Exams on Admission: Dg Chest Port 1 View  Result Date: 09/05/2019 CLINICAL DATA:  85 year old male with history of choking on food. Possible aspiration. EXAM: PORTABLE CHEST 1 VIEW COMPARISON:  Chest x-ray 03/18/2019. FINDINGS: Lung volumes are low. No consolidative airspace disease. No pleural effusions. No pneumothorax. No pulmonary nodule or mass noted. Mild cardiomegaly. The patient is rotated to the right on today's exam, resulting in distortion of the mediastinal contours and reduced diagnostic sensitivity and specificity for mediastinal pathology. Aortic atherosclerosis. IMPRESSION: 1. Low lung volumes without radiographic evidence of acute cardiopulmonary disease. 2. Aortic  atherosclerosis. Electronically Signed   By: Vinnie Langton M.D.   On: 09/05/2019 16:47    EKG: Independently reviewed.  Sinus tachycardia.  Assessment/Plan Principal Problem:   Acute kidney injury [facility]) Active Problems:   Hypernatremia  Zachary Obrien is a 85 y.o. male with medical history significant for bladder cancer, BPH who is admitted with UTI, AKI, and dehydration.  Acute kidney injury: Suspect prerenal from dehydration and associated UTI. -Continue IV fluid resuscitation overnight  Hypernatremia: -Continue D5-1/2 NS @ 100 mLhr overnight -Recheck labs in a.m.  UTI: Had E. coli UTI in May 2020 which was resistant to Bactrim but otherwise sensitive. -Continue IV ceftriaxone  Question of aspiration/swallowing difficulties at nursing facility: Keep n.p.o. except  for sips, SLP eval requested.  Previous positive SARS-CoV-2 test 03/18/2019: Per documentation, he was asymptomatic at the time and did not require treatment.  Repeat screening test is pending, he again is asymptomatic from this standpoint at time of admission.   DVT prophylaxis: Subcutaneous heparin Code Status: DNR, confirmed with patient's son Family Communication: Discussed with patient's son Sadik Hassett by phone [phone] Disposition Plan: Pending clinical progress Consults called: None Admission status: Observation   Zada Finders MD [facility]  If 7PM-7AM, please contact night-coverage [email or web address]  09/05/2019, 11:49 PM

## 2019-09-05 NOTE — ED Triage Notes (Signed)
Pt BIB EMS and presents from [facility].  At breakfast pt had an incident with where he chocked on his food but staff does not think pt has any aspiration.  Pt's MD have concerns for elevated lab values.  Paper at bedside.  Pt hx dementia with a DNR and MOST form at bedside.  Pt c/o of being cold and pt's voice is clear in quality.  Easy unlabored breathing observed on arrival.

## 2019-09-06 ENCOUNTER — Other Ambulatory Visit: Payer: Self-pay

## 2019-09-06 DIAGNOSIS — Z961 Presence of intraocular lens: Secondary | ICD-10-CM | POA: Diagnosis present

## 2019-09-06 DIAGNOSIS — R627 Adult failure to thrive: Secondary | ICD-10-CM | POA: Diagnosis present

## 2019-09-06 DIAGNOSIS — N400 Enlarged prostate without lower urinary tract symptoms: Secondary | ICD-10-CM

## 2019-09-06 DIAGNOSIS — M1990 Unspecified osteoarthritis, unspecified site: Secondary | ICD-10-CM | POA: Diagnosis present

## 2019-09-06 DIAGNOSIS — N390 Urinary tract infection, site not specified: Secondary | ICD-10-CM | POA: Diagnosis present

## 2019-09-06 DIAGNOSIS — I361 Nonrheumatic tricuspid (valve) insufficiency: Secondary | ICD-10-CM | POA: Diagnosis not present

## 2019-09-06 DIAGNOSIS — R140 Abdominal distension (gaseous): Secondary | ICD-10-CM | POA: Diagnosis not present

## 2019-09-06 DIAGNOSIS — E440 Moderate protein-calorie malnutrition: Secondary | ICD-10-CM | POA: Diagnosis present

## 2019-09-06 DIAGNOSIS — Z8619 Personal history of other infectious and parasitic diseases: Secondary | ICD-10-CM | POA: Diagnosis not present

## 2019-09-06 DIAGNOSIS — N170 Acute kidney failure with tubular necrosis: Secondary | ICD-10-CM | POA: Diagnosis present

## 2019-09-06 DIAGNOSIS — E870 Hyperosmolality and hypernatremia: Secondary | ICD-10-CM | POA: Diagnosis present

## 2019-09-06 DIAGNOSIS — Z515 Encounter for palliative care: Secondary | ICD-10-CM | POA: Diagnosis present

## 2019-09-06 DIAGNOSIS — N183 Chronic kidney disease, stage 3 unspecified: Secondary | ICD-10-CM | POA: Diagnosis present

## 2019-09-06 DIAGNOSIS — D649 Anemia, unspecified: Secondary | ICD-10-CM | POA: Diagnosis present

## 2019-09-06 DIAGNOSIS — R531 Weakness: Secondary | ICD-10-CM | POA: Diagnosis not present

## 2019-09-06 DIAGNOSIS — E876 Hypokalemia: Secondary | ICD-10-CM | POA: Diagnosis present

## 2019-09-06 DIAGNOSIS — N136 Pyonephrosis: Secondary | ICD-10-CM | POA: Diagnosis present

## 2019-09-06 DIAGNOSIS — Z1629 Resistance to other single specified antibiotic: Secondary | ICD-10-CM | POA: Diagnosis present

## 2019-09-06 DIAGNOSIS — E872 Acidosis: Secondary | ICD-10-CM | POA: Diagnosis present

## 2019-09-06 DIAGNOSIS — R599 Enlarged lymph nodes, unspecified: Secondary | ICD-10-CM | POA: Diagnosis present

## 2019-09-06 DIAGNOSIS — I351 Nonrheumatic aortic (valve) insufficiency: Secondary | ICD-10-CM | POA: Diagnosis not present

## 2019-09-06 DIAGNOSIS — N179 Acute kidney failure, unspecified: Secondary | ICD-10-CM | POA: Diagnosis not present

## 2019-09-06 DIAGNOSIS — L89153 Pressure ulcer of sacral region, stage 3: Secondary | ICD-10-CM | POA: Diagnosis present

## 2019-09-06 DIAGNOSIS — N401 Enlarged prostate with lower urinary tract symptoms: Secondary | ICD-10-CM | POA: Diagnosis present

## 2019-09-06 DIAGNOSIS — Z66 Do not resuscitate: Secondary | ICD-10-CM | POA: Diagnosis present

## 2019-09-06 DIAGNOSIS — E860 Dehydration: Secondary | ICD-10-CM

## 2019-09-06 DIAGNOSIS — F0390 Unspecified dementia without behavioral disturbance: Secondary | ICD-10-CM | POA: Diagnosis present

## 2019-09-06 DIAGNOSIS — B9620 Unspecified Escherichia coli [E. coli] as the cause of diseases classified elsewhere: Secondary | ICD-10-CM | POA: Diagnosis present

## 2019-09-06 DIAGNOSIS — M4628 Osteomyelitis of vertebra, sacral and sacrococcygeal region: Secondary | ICD-10-CM | POA: Diagnosis present

## 2019-09-06 DIAGNOSIS — I5043 Acute on chronic combined systolic (congestive) and diastolic (congestive) heart failure: Secondary | ICD-10-CM | POA: Diagnosis not present

## 2019-09-06 LAB — CBC
HCT: 33.6 % — ABNORMAL LOW (ref 39.0–52.0)
Hemoglobin: 9.8 g/dL — ABNORMAL LOW (ref 13.0–17.0)
MCH: 30.3 pg (ref 26.0–34.0)
MCHC: 29.2 g/dL — ABNORMAL LOW (ref 30.0–36.0)
MCV: 104 fL — ABNORMAL HIGH (ref 80.0–100.0)
Platelets: 226 10*3/uL (ref 150–400)
RBC: 3.23 MIL/uL — ABNORMAL LOW (ref 4.22–5.81)
RDW: 15.5 % (ref 11.5–15.5)
WBC: 8.4 10*3/uL (ref 4.0–10.5)
nRBC: 0 % (ref 0.0–0.2)

## 2019-09-06 LAB — BASIC METABOLIC PANEL
Anion gap: 9 (ref 5–15)
Anion gap: 8 (ref 5–15)
BUN: 65 mg/dL — ABNORMAL HIGH (ref 8–23)
BUN: 63 mg/dL — ABNORMAL HIGH (ref 8–23)
CO2: 19 mmol/L — ABNORMAL LOW (ref 22–32)
CO2: 19 mmol/L — ABNORMAL LOW (ref 22–32)
Calcium: 8.5 mg/dL — ABNORMAL LOW (ref 8.9–10.3)
Calcium: 8.3 mg/dL — ABNORMAL LOW (ref 8.9–10.3)
Chloride: 125 mmol/L — ABNORMAL HIGH (ref 98–111)
Chloride: 125 mmol/L — ABNORMAL HIGH (ref 98–111)
Creatinine, Ser: 1.27 mg/dL — ABNORMAL HIGH (ref 0.61–1.24)
Creatinine, Ser: 1.27 mg/dL — ABNORMAL HIGH (ref 0.61–1.24)
GFR calc Af Amer: 55 mL/min — ABNORMAL LOW (ref >60)
GFR calc Af Amer: 55 mL/min — ABNORMAL LOW (ref >60)
GFR calc non Af Amer: 47 mL/min — ABNORMAL LOW (ref >60)
GFR calc non Af Amer: 47 mL/min — ABNORMAL LOW (ref >60)
Glucose, Bld: 163 mg/dL — ABNORMAL HIGH (ref 70–99)
Glucose, Bld: 156 mg/dL — ABNORMAL HIGH (ref 70–99)
Potassium: 3.5 mmol/L (ref 3.5–5.1)
Potassium: 3.4 mmol/L — ABNORMAL LOW (ref 3.5–5.1)
Sodium: 153 mmol/L — ABNORMAL HIGH (ref 135–145)
Sodium: 152 mmol/L — ABNORMAL HIGH (ref 135–145)

## 2019-09-06 LAB — SARS CORONAVIRUS 2 (TAT 6-24 HRS): SARS Coronavirus 2: NEGATIVE

## 2019-09-06 LAB — MAGNESIUM: Magnesium: 2 mg/dL (ref 1.7–2.4)

## 2019-09-06 LAB — PHOSPHORUS: Phosphorus: 3.4 mg/dL (ref 2.5–4.6)

## 2019-09-06 MED ORDER — ORAL CARE MOUTH RINSE
15.0000 mL | Freq: Two times a day (BID) | OROMUCOSAL | Status: DC
  Administered 2019-09-06 – 2019-09-14 (×16): 15 mL via OROMUCOSAL

## 2019-09-06 MED ORDER — DEXTROSE-NACL 5-0.45 % IV SOLN
INTRAVENOUS | Status: DC
  Administered 2019-09-06: 16:00:00 via INTRAVENOUS

## 2019-09-06 MED ORDER — METOPROLOL TARTRATE 5 MG/5ML IV SOLN
5.0000 mg | Freq: Three times a day (TID) | INTRAVENOUS | Status: DC
  Administered 2019-09-06 – 2019-09-09 (×11): 5 mg via INTRAVENOUS
  Filled 2019-09-06 (×10): qty 5

## 2019-09-06 NOTE — Consult Note (Signed)
[facility] Nurse wound consult note Consultation was completed by review of records, images and assistance from the bedside nurse/clinical staff.    Secure chat to the bedside nurse who is also a Wound Treatment Associate, will update POC once we discuss further  Reason for Consult: sacral pressure injury Wound type: sacral pressure injury Pressure Injury POA: Yes Measurement: 5cm x 3cm x 1.5cm (per WTA measurements) Wound bed: 45% red/55% yellow (see nursing FS) Drainage (amount, consistency, odor) moderate  Periwound: intact  Dressing procedure/placement/frequency: Will add silver hydrofiber for heavy exudate and recalcitrant wound.  Will add air mattress for patient, limited mobility and current vulnerability for further skin breakdown or worsening of current wound.  Patient is DNR with palliative goals of care, for that reason will not add RD for wound healing supplementation.   Discussed POC with bedside nurse.  Re consult if needed, will not follow at this time. Thanks  Dana Dorner [employer], RN,CWOCN, CNS, [place] [phone])

## 2019-09-06 NOTE — Progress Notes (Signed)
MD paged regarding HR of 140. Received orders to give metoprolol 5 mg IV. HR is now 98. Will continue to monitor.

## 2019-09-06 NOTE — Evaluation (Signed)
Clinical/Bedside Swallow Evaluation Patient Details  Name: JEOVANNI DEMICHAEL MRN: [id number] Date of Birth: 07-16-1921  Today's Date: 09/06/2019 Time: SLP Start Time (ACUTE ONLY): 22 SLP Stop Time (ACUTE ONLY): 1133 SLP Time Calculation (min) (ACUTE ONLY): 53 min  Past Medical History:  Past Medical History:  Diagnosis Date  . 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 Bladder cancer ([place])   . Bladder neoplasm   . 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 BPH (benign prostatic hyperplasia)   . Dermatophytosis of nail   . No pertinent past medical history   . Osteoarthritis    Past Surgical History:  Past Surgical History:  Procedure Laterality Date  . bilatera lens replacement sfor cataracts  age 30  . CYSTOSCOPY  09/13/2012   Procedure: CYSTOSCOPY;  Surgeon: Molli Hazard, MD;  Location: [facility];  Service: Urology;  Laterality: N/A;  . CYSTOSCOPY WITH BIOPSY  10/25/2012   Procedure: CYSTOSCOPY WITH BIOPSY;  Surgeon: Molli Hazard, MD;  Location: [facility];  Service: Urology;  Laterality: N/A;  . detached retina surgery left eye  age 64  . EYE SURGERY  age 60   partial cornea tranplants  . hydrocele surgery  age 74  . JOINT REPLACEMENT  2010   right knee replacement  . TONSILLECTOMY  age 17  . TRANSURETHRAL RESECTION OF BLADDER TUMOR  09/13/2012   Procedure: TRANSURETHRAL RESECTION OF BLADDER TUMOR (TURBT);  Surgeon: Molli Hazard, MD;  Location: [facility];  Service: Urology;  Laterality: N/A;  fulguration of bladder tumor   HPI:  pt is a 85 yo male with progressive functional decline over the last six months per MD dnote.  He has h/o fall 03/2019, UTI, AMS, AKI and was found to be COVID +.  He has been at [facility] facility and is on a regular/thin diet- NAS.  Pt also has h/o sacracl, ? heel wound, prior smoker, pna,fall, bladder cancer.  Swallow evaluation ordered.  Upon review of chart, pt was under the care of a palliative team at the  facility.   Assessment / Plan / Recommendation Clinical Impression  Patient is very xerostomic with dried secretions posterior pharynx and in oral cavity.  Spent approximately 25 minutes providing oral care to clear as much as possible with excellent tolerance from pt.  No focal CN deficits - but pt with head flexed to the right and a bony prominence at left clavicle, ?.  He does demonstrate some gross generalized weakness.  Clinical indications of cognitive based oral dysphagia with indications of oral holding, delayed transiting and inconsistent oral residuals of solids that cleared with puree.  Delayed were up to 30 seconds with solid but pt did swallow when cued during session.  Suspect pharyngeal motility is WFL.  Pt does demonstrate some frequent belching during evaluation, therefore recommend sit fully upright.  No s/s of aspiration across all intake, however given his oral deficits and velocity of thin liquids, recommend soft diet/nectar liquids/no straw and tsps of thin.  Will follow up for family education briefly and to assure diet is comfortable for this polite pt. Thanks for this consult.  Informed pt and Rn of recommendations. Skilled intervention included determining dietary consistencies most comfortable for this pt.   SLP Visit Diagnosis: Dysphagia, unspecified (R13.10)    Aspiration Risk  Moderate aspiration risk    Diet Recommendation Dysphagia 3 (Mech soft);Nectar-thick liquid(tsps thin)   Liquid Administration via: Cup;No straw;Spoon Medication Administration: Whole meds with puree  Supervision: Full supervision/cueing for compensatory strategies;Staff to assist with self feeding Compensations: Slow rate;Small sips/bites(use puree to help clear solids as able)    Other  Recommendations Other Recommendations: Order thickener from pharmacy   Follow up Recommendations        Frequency and Duration min 1 x/week  1 week       Prognosis Prognosis for Safe Diet Advancement:  Guarded Barriers to Reach Goals: Other (Comment)(advanced age and progressive functional decline)      Swallow Study   General Date of Onset: 09/06/19 HPI: pt is a 85 yo male with progressive functional decline over the last six months per MD dnote.  He has h/o fall 03/2019, UTI, AMS, AKI and was found to be COVID +.  He has been at [facility] facility and is on a regular/thin diet- NAS.  Pt also has h/o sacracl, ? heel wound, prior smoker, pna,fall, bladder cancer.  Swallow evaluation ordered.  Upon review of chart, pt was under the care of a palliative team at the facility. Type of Study: MBS-Modified Barium Swallow Study Temperature Spikes Noted: No Respiratory Status: Room air History of Recent Intubation: No Behavior/Cognition: Alert;Cooperative;Pleasant mood Oral Cavity Assessment: Dry;Dried secretions;Excessive secretions Oral Care Completed by SLP: No Oral Cavity - Dentition: Adequate natural dentition Vision: Impaired for self-feeding Self-Feeding Abilities: Total assist Patient Positioning: Upright in bed Baseline Vocal Quality: Low vocal intensity Volitional Cough: Weak Volitional Swallow: Unable to elicit    Oral/Motor/Sensory Function Overall Oral Motor/Sensory Function: Generalized oral weakness(pt also head flexed to the right side; and note bony prominence on left clavicular region)   Ice Chips Ice chips: Not tested Other Comments: pt admits ice is not comfortable for him to swallow   Thin Liquid Thin Liquid: Impaired Presentation: Spoon;Cup Oral Phase Impairments: Reduced labial seal;Reduced lingual movement/coordination Oral Phase Functional Implications: Oral holding;Right anterior spillage Other Comments: variable 8-15 seconds seconds delay    Nectar Thick Nectar Thick Liquid: Impaired Presentation: Cup;Spoon;Straw Oral Phase Impairments: Reduced lingual movement/coordination Oral phase functional implications: Oral holding Other Comments: variable 8-15 seconds  seconds delay   Honey Thick Honey Thick Liquid: Not tested   Puree Puree: Impaired Presentation: Spoon Oral Phase Impairments: Reduced lingual movement/coordination Oral Phase Functional Implications: Prolonged oral transit   Solid     Solid: Impaired Presentation: Self Fed Oral Phase Impairments: Reduced lingual movement/coordination;Impaired mastication Oral Phase Functional Implications: Oral holding;Impaired mastication Other Comments: use of puree helpful to clear oral solids, pt greatest delay with solids over liquids at times requiring verbal cue to "swallow" which was effective during session      Macario Golds 09/06/2019,12:04 PM    Luanna Salk, [place] [facility] SLP [place] Pager [phone] Office [phone]

## 2019-09-06 NOTE — Progress Notes (Signed)
Unable to finish admission due to patients mental status.

## 2019-09-06 NOTE — Progress Notes (Signed)
PROGRESS NOTE    Zachary Obrien  [id number] DOB: December 05, 1920 DOA: 09/05/2019 PCP: Josetta Huddle, MD   Brief Narrative: Zachary Obrien is a 85 y.o. male with medical history significant for bladder cancer, BPH. Patient presented from SNF secondary to concern for UTI. Found to have evidence of dehydration. Empiric treatment for UTI started.   Assessment & Plan:   Principal Problem:   Acute kidney injury ([place]) Active Problems:   Hypernatremia   Hypernatremia Sodium increased with IV fluids. -Repeat BMP, if still elevated, switch to D5 water -Continue D5 1/2 NS for now  AKI Likely related to dehydration. Baseline creatinine appears to be about 1. Creatinine of 1.52 on admission and is improving with IV fluids -IV fluids as mentioned above -BMP as above  Dehydration Appears patient has had decreased oral intake. Overall appears to be having some failure to thrive since being at a facility during quarantine for COVID diagnosis.  Aspiration concern Speech therapy consulted and is recommending a dysphagia 3 diet  Possible UTI Urine culture pending. Ceftriaxone started empirically. -Follow urine culture -Continue ceftriaxone  BPH -Continue finasteride  Pressure injury Mid/upper sacrum, poa   DVT prophylaxis: Heparin Code Status:   Code Status: DNR Family Communication: Son on telephone Disposition Plan: Discharge pending fluid resuscitation and UTI treatment/workup   Consultants:   None  Procedures:   None  Antimicrobials:  Ceftriaxone    Subjective: No concerns today  Objective: Vitals:   09/06/19 0417 09/06/19 0543 09/06/19 0605 09/06/19 1327  BP:  129/68  107/69  Pulse: (!) 108 (!) 101 98 (!) 110  Resp:  18  18  Temp:  97.7 F (36.5 C)  97.8 F (36.6 C)  TempSrc:  Oral  Oral  SpO2:  (!) 84%  99%    Intake/Output Summary (Last 24 hours) at 09/06/2019 1445 Last data filed at 09/06/2019 1300 Gross per 24 hour  Intake 969.43 ml   Output 1550 ml  Net -580.57 ml   There were no vitals filed for this visit.  Examination:  General exam: Appears calm and comfortable Respiratory system: Clear to auscultation. Respiratory effort normal. Cardiovascular system: S1 & S2 heard, RRR. No murmurs, rubs, gallops or clicks. Gastrointestinal system: Abdomen is nondistended, soft and nontender. No organomegaly or masses felt. Normal bowel sounds heard. Central nervous system: Alert and oriented to perso. No focal neurological deficits. Extremities: No edema. No calf tenderness Skin: No cyanosis. No rashes Psychiatry: Judgement and insight appear impaired. Mood & affect appropriate.     Data Reviewed: I have personally reviewed following labs and imaging studies  CBC: Recent Labs  Lab 09/05/19 1710 09/06/19 0328  WBC 9.8 8.4  HGB 9.9* 9.8*  HCT 34.0* 33.6*  MCV 103.7* 104.0*  PLT 255 [id number]   Basic Metabolic Panel: Recent Labs  Lab 09/05/19 1710 09/06/19 0328  NA 152* 153*  K 3.5 3.5  CL 125* 125*  CO2 19* 19*  GLUCOSE 116* 163*  BUN 74* 65*  CREATININE 1.52* 1.27*  CALCIUM 8.8* 8.5*  MG  --  2.0  PHOS  --  3.4   GFR: CrCl cannot be calculated (Unknown ideal weight.). Liver Function Tests: Recent Labs  Lab 09/05/19 1710  AST 22  ALT 31  ALKPHOS 70  BILITOT 0.6  PROT 7.5  ALBUMIN 2.8*   No results for input(s): LIPASE, AMYLASE in the last 168 hours. No results for input(s): AMMONIA in the last 168 hours. Coagulation Profile: No results for input(s): INR, PROTIME in  the last 168 hours. Cardiac Enzymes: No results for input(s): CKTOTAL, CKMB, CKMBINDEX, TROPONINI in the last 168 hours. BNP (last 3 results) No results for input(s): PROBNP in the last 8760 hours. HbA1C: No results for input(s): HGBA1C in the last 72 hours. CBG: No results for input(s): GLUCAP in the last 168 hours. Lipid Profile: No results for input(s): CHOL, HDL, LDLCALC, TRIG, CHOLHDL, LDLDIRECT in the last 72 hours. Thyroid  Function Tests: No results for input(s): TSH, T4TOTAL, FREET4, T3FREE, THYROIDAB in the last 72 hours. Anemia Panel: No results for input(s): VITAMINB12, FOLATE, FERRITIN, TIBC, IRON, RETICCTPCT in the last 72 hours. Sepsis Labs: Recent Labs  Lab 09/05/19 1711  LATICACIDVEN 1.5    Recent Results (from the past 240 hour(s))  SARS CORONAVIRUS 2 (TAT 6-24 HRS) Nasopharyngeal Nasopharyngeal Swab     Status: None   Collection Time: 09/05/19  4:37 PM   Specimen: Nasopharyngeal Swab  Result Value Ref Range Status   SARS Coronavirus 2 NEGATIVE NEGATIVE Final    Comment: (NOTE) SARS-CoV-2 target nucleic acids are NOT DETECTED. The SARS-CoV-2 RNA is generally detectable in upper and lower respiratory specimens during the acute phase of infection. Negative results do not preclude SARS-CoV-2 infection, do not rule out co-infections with other pathogens, and should not be used as the sole basis for treatment or other patient management decisions. Negative results must be combined with clinical observations, patient history, and epidemiological information. The expected result is Negative. Fact Sheet for Patients: SugarRoll.be Fact Sheet for Healthcare Providers: https://www.woods-mathews.com/ This test is not yet approved or cleared by the [place] FDA and  has been authorized for detection and/or diagnosis of SARS-CoV-2 by FDA under an Emergency Use Authorization (EUA). This EUA will remain  in effect (meaning this test can be used) for the duration of the COVID-19 declaration under Section 56 4(b)(1) of the Act, 21 U.S.C. section 360bbb-3(b)(1), unless the authorization is terminated or revoked sooner. Performed at [facility], [place] [address] [place], [place] [address]   Culture, blood (single) w Reflex to ID Panel     Status: None (Preliminary result)   Collection Time: 09/05/19  5:09 PM   Specimen: BLOOD  Result Value Ref Range  Status   Specimen Description   Final    BLOOD RIGHT ARM Performed at [facility] [address], [place], [place] [address]    Special Requests   Final    BOTTLES DRAWN AEROBIC AND ANAEROBIC Blood Culture adequate volume Performed at [facility] [address] [place], [place] [address]    Culture   Final    NO GROWTH < 12 HOURS Performed at [facility] [address], [place], [place] [address]    Report Status PENDING  Incomplete         Radiology Studies: Dg Chest Port 1 View  Result Date: 09/05/2019 CLINICAL DATA:  85 year old male with history of choking on food. Possible aspiration. EXAM: PORTABLE CHEST 1 VIEW COMPARISON:  Chest x-ray 03/18/2019. FINDINGS: Lung volumes are low. No consolidative airspace disease. No pleural effusions. No pneumothorax. No pulmonary nodule or mass noted. Mild cardiomegaly. The patient is rotated to the right on today's exam, resulting in distortion of the mediastinal contours and reduced diagnostic sensitivity and specificity for mediastinal pathology. Aortic atherosclerosis. IMPRESSION: 1. Low lung volumes without radiographic evidence of acute cardiopulmonary disease. 2. Aortic atherosclerosis. Electronically Signed   By: Vinnie Langton M.D.   On: 09/05/2019 16:47  Scheduled Meds: . aspirin  81 mg Oral Daily  . ferrous sulfate  325 mg Oral BID WC  . finasteride  5 mg Oral Daily  . heparin  5,000 Units Subcutaneous Q8H  . mouth rinse  15 mL Mouth Rinse BID  . metoprolol tartrate  5 mg Intravenous Q8H  . vitamin B-12  500 mcg Oral Daily   Continuous Infusions: . cefTRIAXone (ROCEPHIN)  IV 1 g (09/06/19 0322)     LOS: 0 days     Cordelia Poche, MD [facility] 09/06/2019, 2:45 PM  If 7PM-7AM, please contact night-coverage [email or web address]

## 2019-09-07 ENCOUNTER — Inpatient Hospital Stay (HOSPITAL_COMMUNITY): Payer: Medicare Other

## 2019-09-07 LAB — BASIC METABOLIC PANEL
Anion gap: 10 (ref 5–15)
Anion gap: 11 (ref 5–15)
BUN: 59 mg/dL — ABNORMAL HIGH (ref 8–23)
BUN: 63 mg/dL — ABNORMAL HIGH (ref 8–23)
CO2: 17 mmol/L — ABNORMAL LOW (ref 22–32)
CO2: 17 mmol/L — ABNORMAL LOW (ref 22–32)
Calcium: 8.5 mg/dL — ABNORMAL LOW (ref 8.9–10.3)
Calcium: 8.6 mg/dL — ABNORMAL LOW (ref 8.9–10.3)
Chloride: 124 mmol/L — ABNORMAL HIGH (ref 98–111)
Chloride: 123 mmol/L — ABNORMAL HIGH (ref 98–111)
Creatinine, Ser: 1.31 mg/dL — ABNORMAL HIGH (ref 0.61–1.24)
Creatinine, Ser: 1.43 mg/dL — ABNORMAL HIGH (ref 0.61–1.24)
GFR calc Af Amer: 53 mL/min — ABNORMAL LOW (ref >60)
GFR calc Af Amer: 48 mL/min — ABNORMAL LOW (ref >60)
GFR calc non Af Amer: 46 mL/min — ABNORMAL LOW (ref >60)
GFR calc non Af Amer: 41 mL/min — ABNORMAL LOW (ref >60)
Glucose, Bld: 118 mg/dL — ABNORMAL HIGH (ref 70–99)
Glucose, Bld: 155 mg/dL — ABNORMAL HIGH (ref 70–99)
Potassium: 3.2 mmol/L — ABNORMAL LOW (ref 3.5–5.1)
Potassium: 3.2 mmol/L — ABNORMAL LOW (ref 3.5–5.1)
Sodium: 151 mmol/L — ABNORMAL HIGH (ref 135–145)
Sodium: 151 mmol/L — ABNORMAL HIGH (ref 135–145)

## 2019-09-07 MED ORDER — FUROSEMIDE 10 MG/ML IJ SOLN
20.0000 mg | Freq: Every day | INTRAMUSCULAR | Status: DC
  Administered 2019-09-07 – 2019-09-08 (×2): 20 mg via INTRAVENOUS
  Filled 2019-09-07 (×2): qty 2

## 2019-09-07 MED ORDER — DEXTROSE 5 % IV SOLN
INTRAVENOUS | Status: DC
  Administered 2019-09-07 – 2019-09-08 (×2): via INTRAVENOUS

## 2019-09-07 NOTE — Progress Notes (Signed)
  Speech Language Pathology Treatment: Dysphagia  Patient Details Name: Zachary Obrien MRN: [id number] DOB: Dec 23, 1920 Today's Date: 09/07/2019 Time: 1425-1440 SLP Time Calculation (min) (ACUTE ONLY): 15 min  Assessment / Plan / Recommendation Clinical Impression  Pt seen at bedside for follow up after BSE completed 09/06/2019. Nursing reports poor po intake today, and that pt requires cues to swallow and to clear pocketing. No overt s/s aspiration observed or reported. Will continue to follow acutely to assess diet tolerance and provide education.   HPI HPI: pt is a 85 yo male with progressive functional decline over the last six months per MD note.  He has h/o fall 03/2019, UTI, AMS, AKI and was found to be COVID +.  He has been at [facility] facility and is on a regular/thin diet- NAS.  Pt also has h/o sacral, ? heel wound, prior smoker, pna, fall, bladder cancer. Swallow evaluation ordered. Upon review of chart, pt was under the care of a palliative team at the facility.      SLP Plan  Continue with current plan of care       Recommendations  Diet recommendations: Dysphagia 3 (mechanical soft);Nectar-thick liquid Liquids provided via: Cup;Straw Medication Administration: Whole meds with puree Supervision: Staff to assist with self feeding;Full supervision/cueing for compensatory strategies Compensations: Slow rate;Small sips/bites Postural Changes and/or Swallow Maneuvers: Seated upright 90 degrees                Oral Care Recommendations: Oral care QID Follow up Recommendations: 24 hour supervision/assistance;Skilled Nursing facility SLP Visit Diagnosis: Dysphagia, unspecified (R13.10) Plan: Continue with current plan of care       GO              Celia B. Quentin Ore, [facility], [place] Speech Language Pathologist Office: [phone] Pager: [phone]  Shonna Chock 09/07/2019, 2:47 PM

## 2019-09-07 NOTE — Progress Notes (Signed)
PROGRESS NOTE    Zachary Obrien  [id number] DOB: 1921-10-27 DOA: 09/05/2019 PCP: Josetta Huddle, MD   Brief Narrative: Zachary Obrien is a 85 y.o. male with medical history significant for bladder cancer, BPH. Patient presented from SNF secondary to concern for UTI. Found to have evidence of dehydration. Empiric treatment for UTI started.   Assessment & Plan:   Principal Problem:   Acute kidney injury ([place]) Active Problems:   AKI (acute kidney injury) ()   Hypernatremia   Hypernatremia Sodium increased with IV fluids. -Watch BMP -Switch to D5 water -Add Lasix as well to regimen at least for one day to help  AKI Likely related to dehydration. Baseline creatinine appears to be about 1. Creatinine of 1.52 on admission and is improving with IV fluids -IV fluids as mentioned above -BMP as above  Dehydration Appears patient has had decreased oral intake. Overall appears to be having some failure to thrive since being at a facility during quarantine for COVID diagnosis.  Aspiration concern Speech therapy consulted and is recommending a dysphagia 3 diet  Possible UTI Urine culture significant for E. Coli. Ceftriaxone started empirically. -Follow-up urine culture sensitivities -Continue ceftriaxone  BPH -Continue finasteride  Pressure injury Mid/upper sacrum, poa   DVT prophylaxis: Heparin Code Status:   Code Status: DNR Family Communication: Son on telephone Disposition Plan: Discharge pending fluid resuscitation and UTI treatment/workup   Consultants:   None  Procedures:   None  Antimicrobials:  Ceftriaxone    Subjective: No concerns today  Objective: Vitals:   09/06/19 1832 09/06/19 2157 09/07/19 0506 09/07/19 1324  BP: 123/77 (!) 138/113 131/76 117/72  Pulse: 99 (!) 109 (!) 106 (!) 103  Resp: 16 20 20 14   Temp: 99.3 F (37.4 C) 98.7 F (37.1 C) 97.9 F (36.6 C) 98.3 F (36.8 C)  TempSrc: Axillary Oral Oral Oral  SpO2: 98% 99%  99% 100%    Intake/Output Summary (Last 24 hours) at 09/07/2019 1640 Last data filed at 09/07/2019 1327 Gross per 24 hour  Intake 1362.86 ml  Output 1025 ml  Net 337.86 ml   There were no vitals filed for this visit.  Examination:  General exam: Appears calm and comfortable Respiratory system: Clear to auscultation. Respiratory effort normal. Cardiovascular system: S1 & S2 heard, RRR. No murmurs, rubs, gallops or clicks. Gastrointestinal system: Abdomen is nondistended, soft and nontender. No organomegaly or masses felt. Normal bowel sounds heard. Central nervous system: Alert and oriented to person. No focal neurological deficits. Extremities: LE edema. No calf tenderness Skin: No cyanosis. No rashes Psychiatry: Judgement and insight appear impaired. Mood & affect appropriate.     Data Reviewed: I have personally reviewed following labs and imaging studies  CBC: Recent Labs  Lab 09/05/19 1710 09/06/19 0328  WBC 9.8 8.4  HGB 9.9* 9.8*  HCT 34.0* 33.6*  MCV 103.7* 104.0*  PLT 255 [id number]   Basic Metabolic Panel: Recent Labs  Lab 09/05/19 1710 09/06/19 0328 09/06/19 1458 09/07/19 0753  NA 152* 153* 152* 151*  K 3.5 3.5 3.4* 3.2*  CL 125* 125* 125* 124*  CO2 19* 19* 19* 17*  GLUCOSE 116* 163* 156* 118*  BUN 74* 65* 63* 59*  CREATININE 1.52* 1.27* 1.27* 1.31*  CALCIUM 8.8* 8.5* 8.3* 8.5*  MG  --  2.0  --   --   PHOS  --  3.4  --   --    GFR: CrCl cannot be calculated (Unknown ideal weight.). Liver Function Tests: Recent Labs  Lab 09/05/19 1710  AST 22  ALT 31  ALKPHOS 70  BILITOT 0.6  PROT 7.5  ALBUMIN 2.8*   No results for input(s): LIPASE, AMYLASE in the last 168 hours. No results for input(s): AMMONIA in the last 168 hours. Coagulation Profile: No results for input(s): INR, PROTIME in the last 168 hours. Cardiac Enzymes: No results for input(s): CKTOTAL, CKMB, CKMBINDEX, TROPONINI in the last 168 hours. BNP (last 3 results) No results for  input(s): PROBNP in the last 8760 hours. HbA1C: No results for input(s): HGBA1C in the last 72 hours. CBG: No results for input(s): GLUCAP in the last 168 hours. Lipid Profile: No results for input(s): CHOL, HDL, LDLCALC, TRIG, CHOLHDL, LDLDIRECT in the last 72 hours. Thyroid Function Tests: No results for input(s): TSH, T4TOTAL, FREET4, T3FREE, THYROIDAB in the last 72 hours. Anemia Panel: No results for input(s): VITAMINB12, FOLATE, FERRITIN, TIBC, IRON, RETICCTPCT in the last 72 hours. Sepsis Labs: Recent Labs  Lab 09/05/19 1711  LATICACIDVEN 1.5    Recent Results (from the past 240 hour(s))  SARS CORONAVIRUS 2 (TAT 6-24 HRS) Nasopharyngeal Nasopharyngeal Swab     Status: None   Collection Time: 09/05/19  4:37 PM   Specimen: Nasopharyngeal Swab  Result Value Ref Range Status   SARS Coronavirus 2 NEGATIVE NEGATIVE Final    Comment: (NOTE) SARS-CoV-2 target nucleic acids are NOT DETECTED. The SARS-CoV-2 RNA is generally detectable in upper and lower respiratory specimens during the acute phase of infection. Negative results do not preclude SARS-CoV-2 infection, do not rule out co-infections with other pathogens, and should not be used as the sole basis for treatment or other patient management decisions. Negative results must be combined with clinical observations, patient history, and epidemiological information. The expected result is Negative. Fact Sheet for Patients: SugarRoll.be Fact Sheet for Healthcare Providers: https://www.woods-mathews.com/ This test is not yet approved or cleared by the [place] FDA and  has been authorized for detection and/or diagnosis of SARS-CoV-2 by FDA under an Emergency Use Authorization (EUA). This EUA will remain  in effect (meaning this test can be used) for the duration of the COVID-19 declaration under Section 56 4(b)(1) of the Act, 21 U.S.C. section 360bbb-3(b)(1), unless the  authorization is terminated or revoked sooner. Performed at [facility], [place] [address] [place], [place] [address]   Culture, blood (single) w Reflex to ID Panel     Status: None (Preliminary result)   Collection Time: 09/05/19  5:09 PM   Specimen: BLOOD  Result Value Ref Range Status   Specimen Description   Final    BLOOD RIGHT ARM Performed at [facility] [address] [place], [place] [address]    Special Requests   Final    BOTTLES DRAWN AEROBIC AND ANAEROBIC Blood Culture adequate volume Performed at [facility] [address], [place], [place] [address]    Culture   Final    NO GROWTH 2 DAYS Performed at [facility] [address] [place], [place] [address]    Report Status PENDING  Incomplete  Culture, Urine     Status: Abnormal (Preliminary result)   Collection Time: 09/05/19  6:00 PM   Specimen: Urine, Clean Catch  Result Value Ref Range Status   Specimen Description   Final    URINE, CLEAN CATCH Performed at [facility], [place] [address], [place], [place] [address]    Special Requests   Final    NONE Performed at  [facility], [place] [address], [place], [place] [address]    Culture (A)  Final    >=100,000 COLONIES/mL ESCHERICHIA COLI SUSCEPTIBILITIES TO FOLLOW CULTURE REINCUBATED FOR BETTER GROWTH Performed at [facility], [place] [address], [place],  [address]    Report Status PENDING  Incomplete         Radiology Studies: Dg Chest Port 1 View  Result Date: 09/05/2019 CLINICAL DATA:  85 year old male with history of choking on food. Possible aspiration. EXAM: PORTABLE CHEST 1 VIEW COMPARISON:  Chest x-ray 03/18/2019. FINDINGS: Lung volumes are low. No consolidative airspace disease. No pleural effusions. No pneumothorax. No pulmonary nodule or mass noted. Mild cardiomegaly. The patient is rotated to the right on today's exam, resulting in distortion  of the mediastinal contours and reduced diagnostic sensitivity and specificity for mediastinal pathology. Aortic atherosclerosis. IMPRESSION: 1. Low lung volumes without radiographic evidence of acute cardiopulmonary disease. 2. Aortic atherosclerosis. Electronically Signed   By: Vinnie Langton M.D.   On: 09/05/2019 16:47   Dg Abd Portable 1v  Result Date: 09/07/2019 CLINICAL DATA:  History of bladder cancer. EXAM: PORTABLE ABDOMEN - 1 VIEW COMPARISON:  CT abdomen/pelvis 08/15/2014 FINDINGS: The bowel gas pattern is normal. No radio-opaque calculi or other significant radiographic abnormality are seen. IMPRESSION: No acute abdominal abnormality. Electronically Signed   By: Kathreen Devoid   On: 09/07/2019 11:40        Scheduled Meds: . aspirin  81 mg Oral Daily  . ferrous sulfate  325 mg Oral BID WC  . finasteride  5 mg Oral Daily  . furosemide  20 mg Intravenous Daily  . heparin  5,000 Units Subcutaneous Q8H  . mouth rinse  15 mL Mouth Rinse BID  . metoprolol tartrate  5 mg Intravenous Q8H  . vitamin B-12  500 mcg Oral Daily   Continuous Infusions: . cefTRIAXone (ROCEPHIN)  IV 1 g (09/07/19 0418)  . dextrose 50 mL/hr at 09/07/19 1348     LOS: 1 day     Cordelia Poche, MD [facility] 09/07/2019, 4:40 PM  If 7PM-7AM, please contact night-coverage [email or web address]

## 2019-09-08 ENCOUNTER — Inpatient Hospital Stay (HOSPITAL_COMMUNITY): Payer: Medicare Other

## 2019-09-08 LAB — BASIC METABOLIC PANEL
Anion gap: 10 (ref 5–15)
Anion gap: 9 (ref 5–15)
Anion gap: 10 (ref 5–15)
BUN: 65 mg/dL — ABNORMAL HIGH (ref 8–23)
BUN: 68 mg/dL — ABNORMAL HIGH (ref 8–23)
BUN: 65 mg/dL — ABNORMAL HIGH (ref 8–23)
CO2: 18 mmol/L — ABNORMAL LOW (ref 22–32)
CO2: 20 mmol/L — ABNORMAL LOW (ref 22–32)
CO2: 19 mmol/L — ABNORMAL LOW (ref 22–32)
Calcium: 8.6 mg/dL — ABNORMAL LOW (ref 8.9–10.3)
Calcium: 8.6 mg/dL — ABNORMAL LOW (ref 8.9–10.3)
Calcium: 8.7 mg/dL — ABNORMAL LOW (ref 8.9–10.3)
Chloride: 120 mmol/L — ABNORMAL HIGH (ref 98–111)
Chloride: 121 mmol/L — ABNORMAL HIGH (ref 98–111)
Chloride: 121 mmol/L — ABNORMAL HIGH (ref 98–111)
Creatinine, Ser: 1.66 mg/dL — ABNORMAL HIGH (ref 0.61–1.24)
Creatinine, Ser: 1.65 mg/dL — ABNORMAL HIGH (ref 0.61–1.24)
Creatinine, Ser: 1.63 mg/dL — ABNORMAL HIGH (ref 0.61–1.24)
GFR calc Af Amer: 40 mL/min — ABNORMAL LOW (ref >60)
GFR calc Af Amer: 40 mL/min — ABNORMAL LOW (ref >60)
GFR calc Af Amer: 41 mL/min — ABNORMAL LOW (ref >60)
GFR calc non Af Amer: 34 mL/min — ABNORMAL LOW (ref >60)
GFR calc non Af Amer: 35 mL/min — ABNORMAL LOW (ref >60)
GFR calc non Af Amer: 35 mL/min — ABNORMAL LOW (ref >60)
Glucose, Bld: 128 mg/dL — ABNORMAL HIGH (ref 70–99)
Glucose, Bld: 123 mg/dL — ABNORMAL HIGH (ref 70–99)
Glucose, Bld: 100 mg/dL — ABNORMAL HIGH (ref 70–99)
Potassium: 3.5 mmol/L (ref 3.5–5.1)
Potassium: 3.4 mmol/L — ABNORMAL LOW (ref 3.5–5.1)
Potassium: 3.6 mmol/L (ref 3.5–5.1)
Sodium: 148 mmol/L — ABNORMAL HIGH (ref 135–145)
Sodium: 150 mmol/L — ABNORMAL HIGH (ref 135–145)
Sodium: 150 mmol/L — ABNORMAL HIGH (ref 135–145)

## 2019-09-08 LAB — URINE CULTURE: Culture: >=100000 — AB

## 2019-09-08 LAB — OSMOLALITY, URINE: Osmolality, Ur: 365 mOsm/kg (ref 300–900)

## 2019-09-08 LAB — SODIUM, URINE, RANDOM: Sodium, Ur: 61 mmol/L

## 2019-09-08 MED ORDER — FUROSEMIDE 10 MG/ML IJ SOLN
20.0000 mg | Freq: Two times a day (BID) | INTRAMUSCULAR | Status: DC
  Administered 2019-09-08: 17:00:00 20 mg via INTRAVENOUS
  Filled 2019-09-08: qty 2

## 2019-09-08 MED ORDER — STARCH (THICKENING) PO POWD: ORAL | Status: DC | PRN

## 2019-09-08 MED ORDER — RESOURCE THICKENUP CLEAR PO POWD
ORAL | Status: DC | PRN
  Filled 2019-09-08: qty 125

## 2019-09-08 NOTE — Progress Notes (Signed)
PROGRESS NOTE    Zachary Obrien  [id number] DOB: 03/10/1921 DOA: 09/05/2019 PCP: Josetta Huddle, MD   Brief Narrative: Zachary Obrien is a 85 y.o. male with medical history significant for bladder cancer, BPH. Patient presented from SNF secondary to concern for UTI. Found to have evidence of dehydration. Empiric treatment for UTI started.   Assessment & Plan:   Principal Problem:   Acute kidney injury ([place]) Active Problems:   AKI (acute kidney injury) ([place])   Hypernatremia   Hypernatremia Sodium increased with IV fluids initially, now decreasing once switched D5 water.  -Watch BMP -Lasix  AKI Likely related to dehydration. Baseline creatinine appears to be about 1. Creatinine of 1.52 on admission and is improving with IV fluids. Renal ultrasound unremarkable. -IV fluids held -BMP as above  Dehydration Appears patient has had decreased oral intake. Overall appears to be having some failure to thrive since being at a facility during quarantine for COVID diagnosis.  Fluid overload In part secondary to fluid resuscitation. Possible component of heart failure. -Transthoracic Echocardiogram -Lasix 20 mg BID; watch urine out put and weight  Aspiration concern Speech therapy consulted and is recommending a dysphagia 3 diet  Possible UTI Urine culture significant for E. Coli. Ceftriaxone started empirically. -Follow-up urine culture sensitivities -Continue ceftriaxone  BPH -Continue finasteride  Pressure injury Mid/upper sacrum, poa   DVT prophylaxis: Heparin Code Status:   Code Status: DNR Family Communication: Son on telephone Disposition Plan: Discharge pending fluid resuscitation and UTI treatment/workup   Consultants:   None  Procedures:   None  Antimicrobials:  Ceftriaxone    Subjective: No issues. Wants to be repositioned in bed.  Objective: Vitals:   09/07/19 1324 09/07/19 2229 09/08/19 0500 09/08/19 0607  BP: 117/72 116/80  114/64   Pulse: (!) 103 (!) 108  94  Resp: 14 18  20   Temp: 98.3 F (36.8 C) 98.2 F (36.8 C)  97.9 F (36.6 C)  TempSrc: Oral Oral  Oral  SpO2: 100% 98%  100%  Weight:   75.4 kg     Intake/Output Summary (Last 24 hours) at 09/08/2019 1325 Last data filed at 09/08/2019 0847 Gross per 24 hour  Intake 1213.94 ml  Output 650 ml  Net 563.94 ml   Filed Weights   09/08/19 0500  Weight: 75.4 kg    Examination:  General exam: Appears calm and comfortable  Respiratory system: Clear to auscultation. Respiratory effort normal. Cardiovascular system: S1 & S2 heard, RRR. No murmurs, rubs, gallops or clicks. Gastrointestinal system: Abdomen is nondistended, soft and nontender. No organomegaly or masses felt. Normal bowel sounds heard. Central nervous system: Alert and oriented to person and place. No focal neurological deficits. Extremities: 2-3+ LE edema. No calf tenderness Skin: No cyanosis. No rashes    Data Reviewed: I have personally reviewed following labs and imaging studies  CBC: Recent Labs  Lab 09/05/19 1710 09/06/19 0328  WBC 9.8 8.4  HGB 9.9* 9.8*  HCT 34.0* 33.6*  MCV 103.7* 104.0*  PLT 255 [id number]   Basic Metabolic Panel: Recent Labs  Lab 09/06/19 0328 09/06/19 1458 09/07/19 0753 09/07/19 1648 09/08/19 0325 09/08/19 1128  NA 153* 152* 151* 151* 148* 150*  K 3.5 3.4* 3.2* 3.2* 3.5 3.4*  CL 125* 125* 124* 123* 120* 121*  CO2 19* 19* 17* 17* 18* 20*  GLUCOSE 163* 156* 118* 155* 128* 123*  BUN 65* 63* 59* 63* 65* 68*  CREATININE 1.27* 1.27* 1.31* 1.43* 1.66* 1.65*  CALCIUM 8.5* 8.3* 8.5*  8.6* 8.6* 8.6*  MG 2.0  --   --   --   --   --   PHOS 3.4  --   --   --   --   --    GFR: Estimated Creatinine Clearance: 27 mL/min (A) (by C-G formula based on SCr of 1.65 mg/dL (H)). Liver Function Tests: Recent Labs  Lab 09/05/19 1710  AST 22  ALT 31  ALKPHOS 70  BILITOT 0.6  PROT 7.5  ALBUMIN 2.8*   No results for input(s): LIPASE, AMYLASE in the last 168 hours.  No results for input(s): AMMONIA in the last 168 hours. Coagulation Profile: No results for input(s): INR, PROTIME in the last 168 hours. Cardiac Enzymes: No results for input(s): CKTOTAL, CKMB, CKMBINDEX, TROPONINI in the last 168 hours. BNP (last 3 results) No results for input(s): PROBNP in the last 8760 hours. HbA1C: No results for input(s): HGBA1C in the last 72 hours. CBG: No results for input(s): GLUCAP in the last 168 hours. Lipid Profile: No results for input(s): CHOL, HDL, LDLCALC, TRIG, CHOLHDL, LDLDIRECT in the last 72 hours. Thyroid Function Tests: No results for input(s): TSH, T4TOTAL, FREET4, T3FREE, THYROIDAB in the last 72 hours. Anemia Panel: No results for input(s): VITAMINB12, FOLATE, FERRITIN, TIBC, IRON, RETICCTPCT in the last 72 hours. Sepsis Labs: Recent Labs  Lab 09/05/19 1711  LATICACIDVEN 1.5    Recent Results (from the past 240 hour(s))  SARS CORONAVIRUS 2 (TAT 6-24 HRS) Nasopharyngeal Nasopharyngeal Swab     Status: None   Collection Time: 09/05/19  4:37 PM   Specimen: Nasopharyngeal Swab  Result Value Ref Range Status   SARS Coronavirus 2 NEGATIVE NEGATIVE Final    Comment: (NOTE) SARS-CoV-2 target nucleic acids are NOT DETECTED. The SARS-CoV-2 RNA is generally detectable in upper and lower respiratory specimens during the acute phase of infection. Negative results do not preclude SARS-CoV-2 infection, do not rule out co-infections with other pathogens, and should not be used as the sole basis for treatment or other patient management decisions. Negative results must be combined with clinical observations, patient history, and epidemiological information. The expected result is Negative. Fact Sheet for Patients: SugarRoll.be Fact Sheet for Healthcare Providers: https://www.woods-mathews.com/ This test is not yet approved or cleared by the [place] FDA and  has been authorized for detection and/or  diagnosis of SARS-CoV-2 by FDA under an Emergency Use Authorization (EUA). This EUA will remain  in effect (meaning this test can be used) for the duration of the COVID-19 declaration under Section 56 4(b)(1) of the Act, 21 U.S.C. section 360bbb-3(b)(1), unless the authorization is terminated or revoked sooner. Performed at [facility], [place] [address], [place], [place] [address]   Culture, blood (single) w Reflex to ID Panel     Status: None (Preliminary result)   Collection Time: 09/05/19  5:09 PM   Specimen: BLOOD  Result Value Ref Range Status   Specimen Description   Final    BLOOD RIGHT ARM Performed at [facility] [address], [place], [place] [address]    Special Requests   Final    BOTTLES DRAWN AEROBIC AND ANAEROBIC Blood Culture adequate volume Performed at [facility] [address] [place], [place] [address]    Culture   Final    NO GROWTH 3 DAYS Performed at [facility], [place] [address] [place],  [address]    Report Status PENDING  Incomplete  Culture, Urine  Status: Abnormal (Preliminary result)   Collection Time: 09/05/19  6:00 PM   Specimen: Urine, Clean Catch  Result Value Ref Range Status   Specimen Description   Final    URINE, CLEAN CATCH Performed at [facility], [place] [address], [place], [place] [address]    Special Requests   Final    NONE Performed at [facility], [place] [address], [place], [place] [address]    Culture (A)  Final    >=100,000 COLONIES/mL ESCHERICHIA COLI SUSCEPTIBILITIES TO FOLLOW CULTURE REINCUBATED FOR BETTER GROWTH Performed at [facility], [place] [address] [place], [place] [address]    Report Status PENDING  Incomplete         Radiology Studies: US Renal  Result Date: 09/08/2019 CLINICAL DATA:  34 year old with acute kidney injury. EXAM: RENAL / URINARY TRACT ULTRASOUND COMPLETE COMPARISON:  CT of the  abdomen 08/15/2014 FINDINGS: Right Kidney: Renal measurements: 9.1 x 4.8 x 5.3 cm = volume: 121 mL. Right kidney is not well visualized. There is a large anechoic cyst involving the upper pole that measures 5.2 x 7.3 x 6.2 cm. No evidence for right hydronephrosis. Hypoechoic material in the renal sinus area probably represents known cysts. Left Kidney: Renal measurements: 7.5 x 3.0 x 3.8 cm = volume: 45 mL. Negative for hydronephrosis. Limited evaluation of the left kidney. Anechoic cyst near the upper pole that measures 4.0 x 4.4 x 3.8 cm. Bladder: Appears normal for degree of bladder distention. IMPRESSION: 1. Limited evaluation of both kidneys. No evidence for hydronephrosis. 2. Bilateral renal cysts. Electronically Signed   By: Markus Daft M.D.   On: 09/08/2019 09:39   Dg Abd Portable 1v  Result Date: 09/07/2019 CLINICAL DATA:  History of bladder cancer. EXAM: PORTABLE ABDOMEN - 1 VIEW COMPARISON:  CT abdomen/pelvis 08/15/2014 FINDINGS: The bowel gas pattern is normal. No radio-opaque calculi or other significant radiographic abnormality are seen. IMPRESSION: No acute abdominal abnormality. Electronically Signed   By: Kathreen Devoid   On: 09/07/2019 11:40        Scheduled Meds: . aspirin  81 mg Oral Daily  . ferrous sulfate  325 mg Oral BID WC  . finasteride  5 mg Oral Daily  . furosemide  20 mg Intravenous BID  . heparin  5,000 Units Subcutaneous Q8H  . mouth rinse  15 mL Mouth Rinse BID  . metoprolol tartrate  5 mg Intravenous Q8H  . vitamin B-12  500 mcg Oral Daily   Continuous Infusions: . cefTRIAXone (ROCEPHIN)  IV 1 g (09/08/19 0430)     LOS: 2 days     Cordelia Poche, MD [facility] 09/08/2019, 1:25 PM  If 7PM-7AM, please contact night-coverage [email or web address]

## 2019-09-09 ENCOUNTER — Inpatient Hospital Stay (HOSPITAL_COMMUNITY): Payer: Medicare Other

## 2019-09-09 ENCOUNTER — Encounter (HOSPITAL_COMMUNITY): Payer: Self-pay | Admitting: Nephrology

## 2019-09-09 DIAGNOSIS — I361 Nonrheumatic tricuspid (valve) insufficiency: Secondary | ICD-10-CM

## 2019-09-09 DIAGNOSIS — I351 Nonrheumatic aortic (valve) insufficiency: Secondary | ICD-10-CM

## 2019-09-09 LAB — CREATININE, URINE, RANDOM: Creatinine, Urine: 11.66 mg/dL

## 2019-09-09 LAB — BASIC METABOLIC PANEL
Anion gap: 10 (ref 5–15)
Anion gap: 10 (ref 5–15)
BUN: 66 mg/dL — ABNORMAL HIGH (ref 8–23)
BUN: 64 mg/dL — ABNORMAL HIGH (ref 8–23)
CO2: 19 mmol/L — ABNORMAL LOW (ref 22–32)
CO2: 19 mmol/L — ABNORMAL LOW (ref 22–32)
Calcium: 8.7 mg/dL — ABNORMAL LOW (ref 8.9–10.3)
Calcium: 8.7 mg/dL — ABNORMAL LOW (ref 8.9–10.3)
Chloride: 120 mmol/L — ABNORMAL HIGH (ref 98–111)
Chloride: 121 mmol/L — ABNORMAL HIGH (ref 98–111)
Creatinine, Ser: 1.64 mg/dL — ABNORMAL HIGH (ref 0.61–1.24)
Creatinine, Ser: 1.69 mg/dL — ABNORMAL HIGH (ref 0.61–1.24)
GFR calc Af Amer: 40 mL/min — ABNORMAL LOW (ref >60)
GFR calc Af Amer: 39 mL/min — ABNORMAL LOW (ref >60)
GFR calc non Af Amer: 35 mL/min — ABNORMAL LOW (ref >60)
GFR calc non Af Amer: 34 mL/min — ABNORMAL LOW (ref >60)
Glucose, Bld: 113 mg/dL — ABNORMAL HIGH (ref 70–99)
Glucose, Bld: 162 mg/dL — ABNORMAL HIGH (ref 70–99)
Potassium: 3.6 mmol/L (ref 3.5–5.1)
Potassium: 3.3 mmol/L — ABNORMAL LOW (ref 3.5–5.1)
Sodium: 149 mmol/L — ABNORMAL HIGH (ref 135–145)
Sodium: 150 mmol/L — ABNORMAL HIGH (ref 135–145)

## 2019-09-09 LAB — ECHOCARDIOGRAM COMPLETE: Weight: 2603.19 oz

## 2019-09-09 LAB — SODIUM, URINE, RANDOM: Sodium, Ur: 120 mmol/L

## 2019-09-09 MED ORDER — FUROSEMIDE 10 MG/ML IJ SOLN
40.0000 mg | Freq: Every day | INTRAMUSCULAR | Status: DC
  Administered 2019-09-09: 40 mg via INTRAVENOUS
  Filled 2019-09-09: qty 4

## 2019-09-09 MED ORDER — FUROSEMIDE 10 MG/ML IJ SOLN
80.0000 mg | Freq: Three times a day (TID) | INTRAMUSCULAR | Status: DC
  Administered 2019-09-09 – 2019-09-10 (×2): 80 mg via INTRAVENOUS
  Filled 2019-09-09 (×2): qty 8

## 2019-09-09 MED ORDER — METOPROLOL TARTRATE 5 MG/5ML IV SOLN
5.0000 mg | Freq: Three times a day (TID) | INTRAVENOUS | Status: DC
  Administered 2019-09-09 – 2019-09-14 (×13): 5 mg via INTRAVENOUS
  Filled 2019-09-09 (×13): qty 5

## 2019-09-09 NOTE — Consult Note (Addendum)
Renal Service Consult Note [facility]  Zachary Obrien 09/09/2019 Zachary Obrien Requesting Physician:  Dr Lonny Prude, R.   Reason for Consult:  Renal failure HPI: The patient is a 85 y.o. year-old with hx of BPH, bladder cancer (sp TURBT 2013), OA presented on 09/05/19 to ED brought from [facility] for possible aspirating event and abnormal labs. Hx of dementia w/ MOST form. Was in hospital 03/2019 for fall and was rx'd for [facility] UTI, he also had incidental COVID+ testing but was not very symptomatic. He returned to a SNF at [place].  Per family pt has become weaker since the May admission, and may be bedridden, family is not sure due to limited visitation.  Has become confused in the past when having a UTI.  Code status is DNR.  Family is focused on quality of life for Zachary Obrien.    Serum Na+'s have been high at 152 on admission and down to 150 today.  Has had 3/ 4 days w/ IV lasix here. Has received D5 1/2 at 100/hr then D5W at 50cc/hr, now dc'd. Getting IV rocephin for UTI. No contrast , nsaids, ACEi/ ARB.  BP's were normal on admission and have remained so. HR was 110-120's on admission w/ slight fever, fevers resolved and HR is lower 98- 118 now.   Pt is confused and not answering questions well.       Date  Creat   eGFR  2010- 2017 0.99- 1.49  May 2020 1.46 >> 1.06  40 > 59 ml/min, CKD III  09/05/2019 1.52 >> 1.69 today      Admitted Jan 2017 - for CAP and BPH     Admitted May 2020 - for fall and head abrasion, found to have COVID-19 infection +, minimally symptomatic.  For fall had telemetry, echo delayed for COVID purpose. EColi UTI rx'd w/ Rocephin. AI 1.4 > 1.0 at discharge.    ROS  denies CP  no joint pain   no HA  no blurry vision  no rash  no diarrhea  no nausea/ vomiting  no dysuria  no difficulty voiding  no change in urine color    Past Medical History  Past Medical History:  Diagnosis Date  . 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 Bladder cancer ([place])   . Bladder neoplasm   . 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 BPH (benign prostatic hyperplasia)   . Dermatophytosis of nail   . No pertinent past medical history   . Osteoarthritis    Past Surgical History  Past Surgical History:  Procedure Laterality Date  . bilatera lens replacement sfor cataracts  age 70  . CYSTOSCOPY  09/13/2012   Procedure: CYSTOSCOPY;  Surgeon: Molli Hazard, MD;  Location: [facility];  Service: Urology;  Laterality: N/A;  . CYSTOSCOPY WITH BIOPSY  10/25/2012   Procedure: CYSTOSCOPY WITH BIOPSY;  Surgeon: Molli Hazard, MD;  Location: [facility];  Service: Urology;  Laterality: N/A;  . detached retina surgery left eye  age 58  . EYE SURGERY  age 66   partial cornea tranplants  . hydrocele surgery  age 69  . JOINT REPLACEMENT  2010   right knee replacement  . TONSILLECTOMY  age 74  . TRANSURETHRAL RESECTION OF BLADDER TUMOR  09/13/2012   Procedure: TRANSURETHRAL RESECTION OF BLADDER TUMOR (TURBT);  Surgeon: Molli Hazard, MD;  Location: [facility];  Service: Urology;  Laterality: N/A;  fulguration of bladder tumor  Family History  Family History  Problem Relation Age of Onset  . Heart disease Father   . Hypertension Father    Social History  reports that he quit smoking about 53 years ago. His smoking use included cigarettes. He has a 40.00 pack-year smoking history. He has never used smokeless tobacco. He reports current alcohol use of about 2.0 standard drinks of alcohol per week. He reports that he does not use drugs. Allergies No Known Allergies Home medications Prior to Admission medications   Medication Sig Start Date End Date Taking? Authorizing Provider  acetaminophen (TYLENOL) 325 MG tablet Take 2 tablets (650 mg total) by mouth every 6 (six) hours as needed for mild pain, fever or headache (fever >/= 101). Patient taking differently: Take 650 mg by mouth daily.  03/22/19  Yes Elgergawy,  Dawood S, MD  ascorbic acid (VITAMIN C) 500 MG tablet Take 500 mg by mouth daily.   Yes [provider]  aspirin 81 MG chewable tablet Chew 81 mg by mouth daily.   Yes [provider]  cholecalciferol (VITAMIN D) 1000 units tablet Take 1,000 Units by mouth daily.   Yes [provider]  docusate sodium (COLACE) 100 MG capsule Take 100 mg by mouth daily as needed for mild constipation.   Yes [provider]  ferrous sulfate 325 (65 FE) MG tablet Take 325 mg by mouth 2 (two) times a day.    Yes [provider]  finasteride (PROSCAR) 5 MG tablet Take 5 mg by mouth daily.    Yes [provider]  metoprolol tartrate (LOPRESSOR) 25 MG tablet Take 25 mg by mouth 2 (two) times daily.   Yes [provider]  Multiple Vitamins-Minerals (DAILY MULTIVITAMIN PO) Take 1 capsule by mouth. decuvi viite qd   Yes [provider]  Nutritional Supplements (NUTRITIONAL DRINK PO) Take 120 mLs by mouth 2 (two) times daily. Medpass 2.0   Yes [provider]  polyethylene glycol (MIRALAX / GLYCOLAX) 17 g packet Take 17 g by mouth daily.   Yes [provider]  senna (SENOKOT) 8.6 MG TABS tablet Take 1 tablet by mouth.   Yes [provider]  traMADol (ULTRAM) 50 MG tablet Take 50 mg by mouth every 8 (eight) hours as needed.   Yes [provider]  vitamin B-12 (CYANOCOBALAMIN) 500 MCG tablet Take 500 mcg by mouth daily.    Yes [provider]   Liver Function Tests Recent Labs  Lab 09/05/19 1710  AST 22  ALT 31  ALKPHOS 70  BILITOT 0.6  PROT 7.5  ALBUMIN 2.8*   No results for input(s): LIPASE, AMYLASE in the last 168 hours. CBC Recent Labs  Lab 09/05/19 1710 09/06/19 0328  WBC 9.8 8.4  HGB 9.9* 9.8*  HCT 34.0* 33.6*  MCV 103.7* 104.0*  PLT 255 226   Basic Metabolic Panel Recent Labs  Lab 09/06/19 0328  09/07/19 0753 09/07/19 1648 09/08/19 0325 09/08/19 1128 09/08/19 1700 09/09/19 0309  09/09/19 1140  NA 153*   < > 151* 151* 148* 150* 150* 149* 150*  K 3.5   < > 3.2* 3.2* 3.5 3.4* 3.6 3.6 3.3*  CL 125*   < > 124* 123* 120* 121* 121* 120* 121*  CO2 19*   < > 17* 17* 18* 20* 19* 19* 19*  GLUCOSE 163*   < > 118* 155* 128* 123* 100* 113* 162*  BUN 65*   < > 59* 63* 65* 68* 65* 66* 64*  CREATININE   1.27*   < > 1.31* 1.43* 1.66* 1.65* 1.63* 1.64* 1.69*  CALCIUM 8.5*   < > 8.5* 8.6* 8.6* 8.6* 8.7* 8.7* 8.7*  PHOS 3.4  --   --   --   --   --   --   --   --    < > = values in this interval not displayed.   Iron/TIBC/Ferritin/ %Sat    Component Value Date/Time   FERRITIN 1,052 (H) 03/18/2019 2234    Vitals:   09/08/19 2217 09/09/19 0443 09/09/19 0457 09/09/19 1232  BP: 117/68 102/61  115/67  Pulse: 100 (!) 103  96  Resp: 19 16  17  Temp: 98.3 F (36.8 C) 97.9 F (36.6 C)  97.8 F (36.6 C)  TempSrc: Oral Oral  Oral  SpO2: 95% 100%  100%  Weight:   73.8 kg     Exam Gen pt is debilitated and confused, very weak No rash, cyanosis or gangrene Sclera anicteric, throat clear  No jvd or bruits Chest clear bilat to bases no rales or wheezing RRR no MRG  Abd soft ntnd no mass or ascites +bs GU normal male w/ condom cath on draining darkish urine  MS no joint effusions or deformity Ext diffuse 2-3+ bilat leg edema, no abd wall edema Neuro is not aware I'm in the room unless speaking to him, very confused, not answering questions    Home meds:  - metoprolol 25 bid  - aspirin 81  - tramadol 50 tid prn  - finasteride 5 qd  - prn's/ vitamins/ supplements   UA from 09/05/19 > cloudy, 30 protein, many bact, 6-10 rbc, > 50 wbc  UCx + >100k Ecoli mostly sensitive , R to bactrim, >100k Aerococcus species   Blood cx negative     CXR - 10/28 is negative   Assessment/ Plan: 1. AKI - creat rising 1.2 > 1.6 here w/ IV lasix although I/O are even and in spite of markedly edematous legs. Good UOP on lasix.  CXR and lung exam are however normal, mouth is dry. Could be  intravasc dry, could have cirrhosis , or some pelvic malignancy blocking venous/ lymphatic drainage of the legs. LVEF is 45% but no pulm edema. BP's are wnl. Could be CHF that needs higher lasix dose. Hard to say . Have d/w primary, will double IV lasix dose and watch creatinine.  Will get pelvic/ abd CT. If cannot fix would consider hospice care.  Not a dialysis candidate. Will d/w primary.  2. Hypernatremia - would need 100- 150 cc/hr D5W to correct this. Will d/w primary.  3. H/o bladder cancer 4. UTI - Ecoli and aerococcus, per primary team.       Rob   MD 09/09/2019, 4:38 PM   

## 2019-09-09 NOTE — Progress Notes (Signed)
Initial Nutrition Assessment  INTERVENTION:   -Ensure Enlive po BID, each supplement provides 350 kcal and 20 grams of protein -Multivitamin with minerals daily  NUTRITION DIAGNOSIS:   Increased nutrient needs related to wound healing as evidenced by estimated needs  GOAL:   Patient will meet greater than or equal to 90% of their needs  MONITOR:   PO intake, Supplement acceptance, Labs, Weight trends, I & O's, Skin  REASON FOR ASSESSMENT:   Consult Assessment of nutrition requirement/status  ASSESSMENT:   85 y.o. male with medical history significant for bladder cancer, BPH. Patient presented from SNF secondary to concern for UTI. Found to have evidence of dehydration.  **RD working remotely**  Patient currently alert/oriented x 2. Pt was evaluated by SLP and recommended dysphagia 3 diet with nectar thick liquids. Pt is currently consuming 50-100% of meals today. Will add Ensure and daily MVI d/t pt having stage 3 pressure injury.  Per weight records, pt has lost 12 lbs since 5/12 (6% wt loss x 5.5 months, insignificant for time frame).  I/Os: -83 ml since admit UOP: 450 ml so far today  Medications: Ferrous sulfate tablet BID,  IV Lasix, Vitamin B-12 Labs reviewed: Elevated Na Low K  NUTRITION - FOCUSED PHYSICAL EXAM:  Unable to perform -working remotely.  Diet Order:   Diet Order            DIET DYS 3 Room service appropriate? No; Fluid consistency: Nectar Thick  Diet effective now              EDUCATION NEEDS:   Not appropriate for education at this time  Skin:  Skin Assessment: Skin Integrity Issues: Skin Integrity Issues:: Stage III Stage III: sacrum  Last BM:  PTA  Height:   Ht Readings from Last 1 Encounters:  03/20/19 5\' 10"  (1.778 m)    Weight:   Wt Readings from Last 1 Encounters:  09/09/19 73.8 kg    Ideal Body Weight:  75.5 kg  BMI:  Body mass index is 23.34 kg/m.  Estimated Nutritional Needs:   Kcal:   1700-1900  Protein:  75-85g  Fluid:  1.9L/day  Clayton Bibles, MS, RD, LDN Inpatient Clinical Dietitian Pager: [phone] After Hours Pager: [phone]

## 2019-09-09 NOTE — Progress Notes (Signed)
  Echocardiogram 2D Echocardiogram has been performed.  Jennette Dubin 09/09/2019, 11:56 AM

## 2019-09-09 NOTE — Progress Notes (Signed)
PROGRESS NOTE    Zachary Obrien  [id number] DOB: 1921-03-21 DOA: 09/05/2019 PCP: Josetta Huddle, MD   Brief Narrative: Zachary Obrien is a 85 y.o. male with medical history significant for bladder cancer, BPH. Patient presented from SNF secondary to concern for UTI. Found to have evidence of dehydration. Empiric treatment for UTI started.   Assessment & Plan:   Principal Problem:   Acute kidney injury ([place]) Active Problems:   AKI (acute kidney injury) ([place])   Hypernatremia   Hypernatremia Difficult to manage. Some improvement from admission with combination of fluids/lasix but now stable. Mental status appears to be improved -Watch BMP -Lasix -Nephrology consult  AKI Likely related to dehydration. Baseline creatinine appears to be about 1. Creatinine of 1.52 on admission and initially improved with IV fluids. Renal ultrasound unremarkable. Creatinine now worsening after IV fluids which were discontinued. On lasix now secondary to AKI in setting of anasarca. -IV fluids held -BMP as above -Lasix; Nephrology consult  Dehydration Appears patient has had decreased oral intake. Overall appears to be having some failure to thrive since being at a facility during quarantine for COVID diagnosis.  Fluid overload In part secondary to fluid resuscitation. Possible component of heart failure. Weight appears to be down 4 lbs. -Increase to Lasix 40 mg daily, recheck BMP and possible switch to BID dosing for this evening  Combined systolic and diastolic heart failure Patient with fluid overload. No respiratory compromise. Appears to have some acute exacerbation in setting of IV fluid resuscitation -Daily weights/strict in and out  Aspiration concern Speech therapy consulted and is recommending a dysphagia 3 diet  Possible UTI Urine culture significant for E. Coli and Aerococcus species. Ceftriaxone started empirically. -Follow-up urine culture sensitivities -Continue  ceftriaxone, treat for 5 days  BPH -Continue finasteride  Abdominal distension Possibly secondary anasarca but want to rule out GI causes in setting of decreased stool output. Patient is asymptomatic.  Pressure injury Mid/upper sacrum, poa   DVT prophylaxis: Heparin Code Status:   Code Status: DNR Family Communication: son on telephone Disposition Plan: Discharge pending fluid resuscitation and UTI treatment/workup   Consultants:   Nephrology  Procedures:   10/31: Transthoracic Echocardiogram IMPRESSIONS    1. Left ventricular ejection fraction, by visual estimation, is 45 to 50%. The left ventricle has mildly decreased function. There is moderately increased left ventricular hypertrophy.  2. Left ventricular diastolic parameters are consistent with Grade II diastolic dysfunction (pseudonormalization).  3. Global right ventricle has normal systolic function.The right ventricular size is normal. No increase in right ventricular wall thickness.  4. Left atrial size was normal.  5. Right atrial size was normal.  6. The mitral valve is normal in structure. No evidence of mitral valve regurgitation. No evidence of mitral stenosis.  7. The tricuspid valve is normal in structure. Tricuspid valve regurgitation is mild.  8. The aortic valve is tricuspid. Aortic valve regurgitation is mild. Mild to moderate aortic valve sclerosis/calcification without any evidence of aortic stenosis.  9. There is Moderate calcification of the aortic valve. 10. There is Moderate thickening of the aortic valve. 11. The pulmonic valve was normal in structure. Pulmonic valve regurgitation is trivial. 12. There is mild dilatation of the ascending aorta measuring 40 mm. 13. Normal pulmonary artery systolic pressure. 14. The inferior vena cava is normal in size with greater than 50% respiratory variability, suggesting right atrial pressure of 3 mmHg.  FINDINGS  Left Ventricle: Left ventricular ejection  fraction, by visual estimation, is 45 to  50%. The left ventricle has mildly decreased function. The left ventricle is not well visualized. There is moderately increased left ventricular hypertrophy. Septal left  ventricular hypertrophy. Left ventricular diastolic parameters are consistent with Grade II diastolic dysfunction (pseudonormalization). Normal left atrial pressure.  Right Ventricle: The right ventricular size is normal. No increase in right ventricular wall thickness. Global RV systolic function is has normal systolic function. The tricuspid regurgitant velocity is 2.34 m/s, and with an assumed right atrial pressure  of 3 mmHg, the estimated right ventricular systolic pressure is normal at 24.9 mmHg.  Left Atrium: Left atrial size was normal in size.  Right Atrium: Right atrial size was normal in size  Pericardium: There is no evidence of pericardial effusion.  Mitral Valve: The mitral valve is normal in structure. No evidence of mitral valve stenosis by observation. No evidence of mitral valve regurgitation.  Tricuspid Valve: The tricuspid valve is normal in structure. Tricuspid valve regurgitation is mild.  Aortic Valve: The aortic valve is tricuspid. . There is Moderate thickening and Moderate calcification of the aortic valve. Aortic valve regurgitation is mild. Aortic regurgitation PHT measures 236 msec. Mild to moderate aortic valve  sclerosis/calcification is present, without any evidence of aortic stenosis. There is Moderate thickening of the aortic valve. Moderate calcification. Aortic valve mean gradient measures 4.0 mmHg. Aortic valve peak gradient measures 8.3 mmHg. Aortic  valve area, by VTI measures 3.70 cm.  Pulmonic Valve: The pulmonic valve was normal in structure. Pulmonic valve regurgitation is trivial.  Aorta: The aortic root, ascending aorta and aortic arch are all structurally normal, with no evidence of dilitation or obstruction. There is mild  dilatation of the ascending aorta measuring 40 mm.  Venous: The inferior vena cava is normal in size with greater than 50% respiratory variability, suggesting right atrial pressure of 3 mmHg.  IAS/Shunts: No atrial level shunt detected by color flow Doppler. No ventricular septal defect is seen or detected. There is no evidence of an atrial septal defect.  Antimicrobials:  Ceftriaxone    Subjective: No concerns today. No abdominal pain  Objective: Vitals:   09/08/19 2022 09/08/19 2217 09/09/19 0443 09/09/19 0457  BP: 103/73 117/68 102/61   Pulse: 100 100 (!) 103   Resp: 18 19 16    Temp: 97.7 F (36.5 C) 98.3 F (36.8 C) 97.9 F (36.6 C)   TempSrc: Axillary Oral Oral   SpO2: 98% 95% 100%   Weight:    73.8 kg    Intake/Output Summary (Last 24 hours) at 09/09/2019 0804 Last data filed at 09/09/2019 0456 Gross per 24 hour  Intake 660 ml  Output 1325 ml  Net -665 ml   Filed Weights   09/08/19 0500 09/09/19 0457  Weight: 75.4 kg 73.8 kg    Examination:  General exam: Appears calm and comfortable Respiratory system: Clear to auscultation. Respiratory effort normal. Cardiovascular system: S1 & S2 heard, RRR. No murmurs, rubs, gallops or clicks. Gastrointestinal system: Abdomen is distended, soft and nontender. No organomegaly or masses felt. Normal bowel sounds heard. Central nervous system: Alert and oriented to person. Extremities: 2+ edema. No calf tenderness Skin: No cyanosis. No rashes Psychiatry: Judgement and insight appear impaired. Mood & affect appropriate.   Data Reviewed: I have personally reviewed following labs and imaging studies  CBC: Recent Labs  Lab 09/05/19 1710 09/06/19 0328  WBC 9.8 8.4  HGB 9.9* 9.8*  HCT 34.0* 33.6*  MCV 103.7* 104.0*  PLT 255 [id number]   Basic Metabolic Panel: Recent Labs  Lab 09/06/19 0328  09/07/19 1648 09/08/19 0325 09/08/19 1128 09/08/19 1700 09/09/19 0309  NA 153*   < > 151* 148* 150* 150* 149*  K 3.5   < >  3.2* 3.5 3.4* 3.6 3.6  CL 125*   < > 123* 120* 121* 121* 120*  CO2 19*   < > 17* 18* 20* 19* 19*  GLUCOSE 163*   < > 155* 128* 123* 100* 113*  BUN 65*   < > 63* 65* 68* 65* 66*  CREATININE 1.27*   < > 1.43* 1.66* 1.65* 1.63* 1.64*  CALCIUM 8.5*   < > 8.6* 8.6* 8.6* 8.7* 8.7*  MG 2.0  --   --   --   --   --   --   PHOS 3.4  --   --   --   --   --   --    < > = values in this interval not displayed.   GFR: Estimated Creatinine Clearance: 27.2 mL/min (A) (by C-G formula based on SCr of 1.64 mg/dL (H)). Liver Function Tests: Recent Labs  Lab 09/05/19 1710  AST 22  ALT 31  ALKPHOS 70  BILITOT 0.6  PROT 7.5  ALBUMIN 2.8*   No results for input(s): LIPASE, AMYLASE in the last 168 hours. No results for input(s): AMMONIA in the last 168 hours. Coagulation Profile: No results for input(s): INR, PROTIME in the last 168 hours. Cardiac Enzymes: No results for input(s): CKTOTAL, CKMB, CKMBINDEX, TROPONINI in the last 168 hours. BNP (last 3 results) No results for input(s): PROBNP in the last 8760 hours. HbA1C: No results for input(s): HGBA1C in the last 72 hours. CBG: No results for input(s): GLUCAP in the last 168 hours. Lipid Profile: No results for input(s): CHOL, HDL, LDLCALC, TRIG, CHOLHDL, LDLDIRECT in the last 72 hours. Thyroid Function Tests: No results for input(s): TSH, T4TOTAL, FREET4, T3FREE, THYROIDAB in the last 72 hours. Anemia Panel: No results for input(s): VITAMINB12, FOLATE, FERRITIN, TIBC, IRON, RETICCTPCT in the last 72 hours. Sepsis Labs: Recent Labs  Lab 09/05/19 1711  LATICACIDVEN 1.5    Recent Results (from the past 240 hour(s))  SARS CORONAVIRUS 2 (TAT 6-24 HRS) Nasopharyngeal Nasopharyngeal Swab     Status: None   Collection Time: 09/05/19  4:37 PM   Specimen: Nasopharyngeal Swab  Result Value Ref Range Status   SARS Coronavirus 2 NEGATIVE NEGATIVE Final    Comment: (NOTE) SARS-CoV-2 target nucleic acids are NOT DETECTED. The SARS-CoV-2 RNA is  generally detectable in upper and lower respiratory specimens during the acute phase of infection. Negative results do not preclude SARS-CoV-2 infection, do not rule out co-infections with other pathogens, and should not be used as the sole basis for treatment or other patient management decisions. Negative results must be combined with clinical observations, patient history, and epidemiological information. The expected result is Negative. Fact Sheet for Patients: SugarRoll.be Fact Sheet for Healthcare Providers: https://www.woods-mathews.com/ This test is not yet approved or cleared by the [place] FDA and  has been authorized for detection and/or diagnosis of SARS-CoV-2 by FDA under an Emergency Use Authorization (EUA). This EUA will remain  in effect (meaning this test can be used) for the duration of the COVID-19 declaration under Section 56 4(b)(1) of the Act, 21 U.S.C. section 360bbb-3(b)(1), unless the authorization is terminated or revoked sooner. Performed at [facility], [place] [address], [place], [place] [address]   Culture, blood (single) w Reflex to ID Panel  Status: None (Preliminary result)   Collection Time: 09/05/19  5:09 PM   Specimen: BLOOD  Result Value Ref Range Status   Specimen Description   Final    BLOOD RIGHT ARM Performed at [facility] [address] [place], [place] [address]    Special Requests   Final    BOTTLES DRAWN AEROBIC AND ANAEROBIC Blood Culture adequate volume Performed at [facility] [address] [place], [place] [address]    Culture   Final    NO GROWTH 4 DAYS Performed at [facility], [place] [address], [place], [place] [address]    Report Status PENDING  Incomplete  Culture, Urine     Status: Abnormal   Collection Time: 09/05/19  6:00 PM   Specimen: Urine, Clean Catch  Result Value Ref Range Status   Specimen Description    Final    URINE, CLEAN CATCH Performed at [facility], [place] [address], [place], [place] [address]    Special Requests   Final    NONE Performed at [facility], [place] [address], [place], [place] [address]    Culture (A)  Final    >=100,000 COLONIES/mL ESCHERICHIA COLI >=100,000 COLONIES/mL AEROCOCCUS SPECIES Standardized susceptibility testing for this organism is not available. Performed at [facility], [place] [address] [place], [place] [address]    Report Status 09/08/2019 FINAL  Final   Organism ID, Bacteria ESCHERICHIA COLI (A)  Final      Susceptibility   Escherichia coli - MIC*    AMPICILLIN 8 SENSITIVE Sensitive     CEFAZOLIN <=4 SENSITIVE Sensitive     CEFTRIAXONE <=1 SENSITIVE Sensitive     CIPROFLOXACIN <=0.25 SENSITIVE Sensitive     GENTAMICIN <=1 SENSITIVE Sensitive     IMIPENEM <=0.25 SENSITIVE Sensitive     NITROFURANTOIN <=16 SENSITIVE Sensitive     TRIMETH/SULFA >=320 RESISTANT Resistant     AMPICILLIN/SULBACTAM <=2 SENSITIVE Sensitive     PIP/TAZO <=4 SENSITIVE Sensitive     Extended ESBL NEGATIVE Sensitive     * >=100,000 COLONIES/mL ESCHERICHIA COLI         Radiology Studies: US Renal  Result Date: 09/08/2019 CLINICAL DATA:  57 year old with acute kidney injury. EXAM: RENAL / URINARY TRACT ULTRASOUND COMPLETE COMPARISON:  CT of the abdomen 08/15/2014 FINDINGS: Right Kidney: Renal measurements: 9.1 x 4.8 x 5.3 cm = volume: 121 mL. Right kidney is not well visualized. There is a large anechoic cyst involving the upper pole that measures 5.2 x 7.3 x 6.2 cm. No evidence for right hydronephrosis. Hypoechoic material in the renal sinus area probably represents known cysts. Left Kidney: Renal measurements: 7.5 x 3.0 x 3.8 cm = volume: 45 mL. Negative for hydronephrosis. Limited evaluation of the left kidney. Anechoic cyst near the upper pole that measures 4.0 x 4.4 x 3.8 cm. Bladder: Appears normal for degree of bladder  distention. IMPRESSION: 1. Limited evaluation of both kidneys. No evidence for hydronephrosis. 2. Bilateral renal cysts. Electronically Signed   By: Markus Daft M.D.   On: 09/08/2019 09:39   Dg Abd Portable 1v  Result Date: 09/07/2019 CLINICAL DATA:  History of bladder cancer. EXAM: PORTABLE ABDOMEN - 1 VIEW COMPARISON:  CT abdomen/pelvis 08/15/2014 FINDINGS: The bowel gas pattern is normal. No radio-opaque calculi or other significant radiographic abnormality are seen. IMPRESSION: No acute abdominal abnormality. Electronically Signed   By: Kathreen Devoid   On: 09/07/2019 11:40  Scheduled Meds: . aspirin  81 mg Oral Daily  . ferrous sulfate  325 mg Oral BID WC  . finasteride  5 mg Oral Daily  . furosemide  40 mg Intravenous Daily  . heparin  5,000 Units Subcutaneous Q8H  . mouth rinse  15 mL Mouth Rinse BID  . metoprolol tartrate  5 mg Intravenous Q8H  . vitamin B-12  500 mcg Oral Daily   Continuous Infusions: . cefTRIAXone (ROCEPHIN)  IV 1 g (09/09/19 0349)     LOS: 3 days     Cordelia Poche, MD [facility] 09/09/2019, 8:04 AM  If 7PM-7AM, please contact night-coverage [email or web address]

## 2019-09-10 ENCOUNTER — Inpatient Hospital Stay (HOSPITAL_COMMUNITY): Payer: Medicare Other

## 2019-09-10 LAB — COMPREHENSIVE METABOLIC PANEL
ALT: 39 U/L (ref 0–44)
AST: 25 U/L (ref 15–41)
Albumin: 2.5 g/dL — ABNORMAL LOW (ref 3.5–5.0)
Alkaline Phosphatase: 84 U/L (ref 38–126)
Anion gap: 13 (ref 5–15)
BUN: 67 mg/dL — ABNORMAL HIGH (ref 8–23)
CO2: 16 mmol/L — ABNORMAL LOW (ref 22–32)
Calcium: 8.6 mg/dL — ABNORMAL LOW (ref 8.9–10.3)
Chloride: 118 mmol/L — ABNORMAL HIGH (ref 98–111)
Creatinine, Ser: 1.88 mg/dL — ABNORMAL HIGH (ref 0.61–1.24)
GFR calc Af Amer: 34 mL/min — ABNORMAL LOW (ref >60)
GFR calc non Af Amer: 29 mL/min — ABNORMAL LOW (ref >60)
Glucose, Bld: 134 mg/dL — ABNORMAL HIGH (ref 70–99)
Potassium: 3 mmol/L — ABNORMAL LOW (ref 3.5–5.1)
Sodium: 147 mmol/L — ABNORMAL HIGH (ref 135–145)
Total Bilirubin: 0.3 mg/dL (ref 0.3–1.2)
Total Protein: 7.4 g/dL (ref 6.5–8.1)

## 2019-09-10 LAB — CULTURE, BLOOD (SINGLE)
Culture: NO GROWTH
Special Requests: ADEQUATE

## 2019-09-10 LAB — MRSA PCR SCREENING: MRSA by PCR: POSITIVE — AB

## 2019-09-10 MED ORDER — BISACODYL 10 MG RE SUPP
10.0000 mg | Freq: Once | RECTAL | Status: AC
  Administered 2019-09-10: 10 mg via RECTAL
  Filled 2019-09-10: qty 1

## 2019-09-10 MED ORDER — FUROSEMIDE 10 MG/ML IJ SOLN
80.0000 mg | Freq: Two times a day (BID) | INTRAMUSCULAR | Status: DC
  Administered 2019-09-10 – 2019-09-11 (×2): 80 mg via INTRAVENOUS
  Filled 2019-09-10 (×3): qty 8

## 2019-09-10 MED ORDER — MUPIROCIN 2 % EX OINT
1.0000 "application " | TOPICAL_OINTMENT | Freq: Two times a day (BID) | CUTANEOUS | Status: DC
  Administered 2019-09-10 – 2019-09-14 (×8): 1 via NASAL
  Filled 2019-09-10 (×2): qty 22

## 2019-09-10 MED ORDER — CHLORHEXIDINE GLUCONATE CLOTH 2 % EX PADS
6.0000 | MEDICATED_PAD | Freq: Every day | CUTANEOUS | Status: DC
  Administered 2019-09-12 – 2019-09-14 (×3): 6 via TOPICAL

## 2019-09-10 MED ORDER — SORBITOL 70 % SOLN
960.0000 mL | TOPICAL_OIL | Freq: Once | ORAL | Status: AC
  Administered 2019-09-11: 960 mL via RECTAL
  Filled 2019-09-10: qty 473

## 2019-09-10 MED ORDER — POTASSIUM CHLORIDE CRYS ER 20 MEQ PO TBCR
40.0000 meq | EXTENDED_RELEASE_TABLET | Freq: Once | ORAL | Status: AC
  Administered 2019-09-10: 40 meq via ORAL
  Filled 2019-09-10: qty 2

## 2019-09-10 MED ORDER — CHLORHEXIDINE GLUCONATE CLOTH 2 % EX PADS
6.0000 | MEDICATED_PAD | Freq: Every day | CUTANEOUS | Status: DC
  Administered 2019-09-11 – 2019-09-14 (×4): 6 via TOPICAL

## 2019-09-10 NOTE — TOC Initial Note (Signed)
Transition of Care [facility]) - Initial/Assessment Note    Patient Details  Name: Zachary Obrien MRN: [id number] Date of Birth: 1921-10-27  [facility] CM/SW Contact:    Joaquin Courts, RN Phone Number: 09/10/2019, 1:14 PM  Clinical Narrative:    Patient from Zachary Obrien. Per admissions rep, patient is long term resident and family is holding patient's bed and he can return to facility at dc. Facility will need updates FL2 and negative Covid test within 48-72 hours of dc. Pt will need insurance auth.                 Expected Discharge Plan: Skilled Nursing Facility Barriers to Discharge: Continued Medical Work up   Patient Goals and CMS Choice Patient states their goals for this hospitalization and ongoing recovery are:: to return to [facility]      Expected Discharge Plan and Services Expected Discharge Plan: [place]   Discharge Planning Services: CM Consult Post Acute Care Choice: Resumption of Svcs/PTA Provider Living arrangements for the past 2 months: Zachary Obrien                 DME Arranged: N/A DME Agency: NA       HH Arranged: NA [place] Agency: NA        Prior Living Arrangements/Services Living arrangements for the past 2 months: Zachary Obrien Lives with:: Facility Resident Patient language and need for interpreter reviewed:: Yes Do you feel safe going back to the place where you live?: Yes      Need for Family Participation in Patient Care: Yes (Comment) Care giver support system in place?: Yes (comment)   Criminal Activity/Legal Involvement Pertinent to Current Situation/Hospitalization: No - Comment as needed  Activities of Daily Living Home Assistive Devices/Equipment: Walker (specify type) ADL Screening (condition at time of admission) Patient's cognitive ability adequate to safely complete daily activities?: Yes Is the patient deaf or have difficulty hearing?: No Does the patient have  difficulty seeing, even when wearing glasses/contacts?: Yes(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) Does the patient have difficulty concentrating, remembering, or making decisions?: Yes Patient able to express need for assistance with ADLs?: Yes Does the patient have difficulty dressing or bathing?: Yes Independently performs ADLs?: No Communication: Independent Dressing (OT): Needs assistance Is this a change from baseline?: Pre-admission baseline Grooming: Needs assistance Is this a change from baseline?: Pre-admission baseline Feeding: Independent with device (comment) Bathing: Dependent Is this a change from baseline?: Pre-admission baseline Toileting: Dependent Is this a change from baseline?: Pre-admission baseline In/Out Bed: Dependent Is this a change from baseline?: Pre-admission baseline Walks in Home: Dependent Is this a change from baseline?: Pre-admission baseline Does the patient have difficulty walking or climbing stairs?: No Weakness of Legs: Both Weakness of Arms/Hands: None  Permission Sought/Granted                  Emotional Assessment Appearance:: Appears stated age         Psych Involvement: No (comment)  Admission diagnosis:  Dehydration [E86.0] Hypernatremia [E87.0] Weakness [R53.1] Patient Active Problem List   Diagnosis Date Noted  . Acute kidney injury ([place]) 09/05/2019  . Hypernatremia 09/05/2019  . COVID-19 virus infection 03/18/2019  . UTI (urinary tract infection) 03/18/2019  . Fall 03/18/2019  . Sinus tachycardia 03/18/2019  . AKI (acute kidney injury) ([place]) 03/18/2019  . CAP (community acquired pneumonia) 11/08/2015  . Pneumonia 11/08/2015  . Dermatophytosis of nail   . Bladder cancer ([place]) 10/25/2012  PCP:  Josetta Huddle, MD Pharmacy:  No Pharmacies Listed    Social Determinants of Health (SDOH) Interventions    Readmission Risk Interventions Readmission Risk Prevention Plan 09/10/2019  Transportation Screening Complete  PCP  or Specialist Appt within 3-5 Days Not Complete  Not Complete comments not yet ready for dc  HRI or [place] Not Complete  HRI or Home Care Consult comments will dc back to SNF  Social Work Consult for [place] Planning/Counseling Complete  Palliative Care Screening Not Applicable  Medication Review (RN Care Manager) Complete  Some recent data might be hidden

## 2019-09-10 NOTE — Progress Notes (Signed)
Called MD concerning bloody urine .  Urine was clear at first, then milky, now it's bloody. Orders given.

## 2019-09-10 NOTE — Progress Notes (Signed)
Dr. Marval Regal notified of scan results, order given.

## 2019-09-10 NOTE — Progress Notes (Addendum)
Patient ID: Zachary Obrien, male   DOB: 10/08/1921, 85 y.o.   MRN: [id number] S: No major events overnight. O:BP 101/81 (BP Location: Left Arm)   Pulse (!) 110   Temp (!) 97.4 F (36.3 C) (Axillary)   Resp 16   Wt 75.8 kg   SpO2 99%   BMI 23.98 kg/m   Intake/Output Summary (Last 24 hours) at 09/10/2019 1651 Last data filed at 09/10/2019 1626 Gross per 24 hour  Intake 1120 ml  Output 5225 ml  Net -4105 ml   Intake/Output: I/O last 3 completed shifts: In: 1480 [P.O.:1280; IV Piggyback:200] Out: 3875 [Urine:3875]  Intake/Output this shift:  Total I/O In: 300 [P.O.:300] Out: 2625 [Urine:2625] Weight change: 2 kg Gen: frail, elderly WM lying in bed, minimally verbal CVS: no rub Resp: decreased BS at bases and poor inspiratory effort Abd: +BS, soft, NT/ND Ext: 1+ edema  Recent Labs  Lab 09/05/19 1710 09/06/19 0328  09/07/19 1648 09/08/19 0325 09/08/19 1128 09/08/19 1700 09/09/19 0309 09/09/19 1140 09/10/19 0336  NA 152* 153*   < > 151* 148* 150* 150* 149* 150* 147*  K 3.5 3.5   < > 3.2* 3.5 3.4* 3.6 3.6 3.3* 3.0*  CL 125* 125*   < > 123* 120* 121* 121* 120* 121* 118*  CO2 19* 19*   < > 17* 18* 20* 19* 19* 19* 16*  GLUCOSE 116* 163*   < > 155* 128* 123* 100* 113* 162* 134*  BUN 74* 65*   < > 63* 65* 68* 65* 66* 64* 67*  CREATININE 1.52* 1.27*   < > 1.43* 1.66* 1.65* 1.63* 1.64* 1.69* 1.88*  ALBUMIN 2.8*  --   --   --   --   --   --   --   --  2.5*  CALCIUM 8.8* 8.5*   < > 8.6* 8.6* 8.6* 8.7* 8.7* 8.7* 8.6*  PHOS  --  3.4  --   --   --   --   --   --   --   --   AST 22  --   --   --   --   --   --   --   --  25  ALT 31  --   --   --   --   --   --   --   --  39   < > = values in this interval not displayed.   Liver Function Tests: Recent Labs  Lab 09/05/19 1710 09/10/19 0336  AST 22 25  ALT 31 39  ALKPHOS 70 84  BILITOT 0.6 0.3  PROT 7.5 7.4  ALBUMIN 2.8* 2.5*   No results for input(s): LIPASE, AMYLASE in the last 168 hours. No results for input(s):  AMMONIA in the last 168 hours. CBC: Recent Labs  Lab 09/05/19 1710 09/06/19 0328  WBC 9.8 8.4  HGB 9.9* 9.8*  HCT 34.0* 33.6*  MCV 103.7* 104.0*  PLT 255 226   Cardiac Enzymes: No results for input(s): CKTOTAL, CKMB, CKMBINDEX, TROPONINI in the last 168 hours. CBG: No results for input(s): GLUCAP in the last 168 hours.  Iron Studies: No results for input(s): IRON, TIBC, TRANSFERRIN, FERRITIN in the last 72 hours. Studies/Results: Ct Abdomen Pelvis Wo Contrast  Result Date: 09/10/2019 CLINICAL DATA:  Concern for UTI, dehydration, history of bladder cancer EXAM: CT ABDOMEN AND PELVIS WITHOUT CONTRAST TECHNIQUE: Multidetector CT imaging of the abdomen and pelvis was performed following the standard protocol without IV  contrast. COMPARISON:  CT abdomen pelvis 08/12/2014, CTA chest 03/19/2019 FINDINGS: Lower chest: Basilar bronchiectatic changes and mucous plugging within the airways of the right left lower lobes. Some adjacent ground-glass opacity. Mild cardiomegaly. Coronary artery calcifications. Calcifications on the aortic leaflets. No pericardial effusion. Hepatobiliary: Punctate calcifications in the otherwise normal liver, likely sequela of granulomatous disease. Gallbladder distention with few partially calcified dependently layering gallstones. No visible intraductal gallstones. No biliary ductal dilatation. Pancreas: Fatty atrophy of the pancreas. No concerning pancreatic lesions or peripancreatic inflammation. No pancreatic ductal dilatation. Spleen: Normal in size without focal abnormality. Adrenals/Urinary Tract: Normal adrenal glands. Stable hyperdense cysts in the upper pole right kidney. Largest measuring 12 mm in size (2/39. Larger fluid attenuation cyst measuring up to 8.2 cm, and creased size from comparison exam large 7.3 cm exophytic lower pole right renal cyst is unchanged from prior. Multiple smaller cysts are seen in both kidneys. Asymmetric atrophy of the left kidney is  present. There is severe bilateral hydroureteronephrosis to the level of the severely distended bladder which demonstrates some asymmetric posterior bladder wall thickening. Additionally there is indentation of the bladder base by the enlarged prostate. Mild circumferential bladder wall thickening. Stomach/Bowel: Distal esophagus, stomach and duodenal sweep are unremarkable. No small bowel wall thickening or dilatation. Fecalization of the distal small bowel contents. A normal appendix is visualized. No colonic dilatation or wall thickening. Scattered colonic diverticula without focal pericolonic inflammation to suggest diverticulitis. Inspissated stool ball at the rectum with some pericolonic stranding and presacral stranding. Vascular/Lymphatic: Atherosclerotic plaque within the normal caliber aorta. Focal region of mid mesenteric hazy stranding with numerous reactive appearing clustered mid mesenteric lymph nodes compatible with mesenteritis. Few reactive nodes are present in the upper abdomen. Reproductive: Borderline prostatomegaly with few coarse calcifications. Other: Skin thickening superficial to the sacrum and coccyx with underlying subcutaneous gas tracking to the level of the bone with some associated lucent features. Presacral stranding seen in close vicinity. Circumferential body wall edema. No bowel containing hernias. Musculoskeletal: Lucent changes of the coccyx near region of presacral fat stranding. Multilevel degenerative changes are present in the imaged portions of the spine. Additional degenerative changes noted in the SI joints hips. IMPRESSION: 1. Severe bilateral hydroureteronephrosis to the level of the markedly distended bladder with asymmetric posterior bladder wall thickening concerning for an underlying malignancy on a background chronic outlet obstruction. 2. Skin thickening superficial to the sacrum and coccyx with underlying subcutaneous gas tracking to the level of the bone with  some associated lucent features, concerning for a sacral decubitus ulcer with features concerning for osteomyelitis. 3. Inspissated stool ball at the rectum. Perirectal and presacral stranding could reflect a stercoral colitis or be secondary to the decubitus process, as detailed above. 4. Fecalization of the distal small bowel contents, suggestive of delayed transit. 5. Focal region of mid mesenteric hazy stranding with numerous reactive appearing clustered mid mesenteric lymph nodes compatible with mesenteritis. 6. Basilar bronchiectasis with areas of mucous plugging and features suggestive for a more acute infectious or inflammatory process in the right lung base. 7. Aortic Atherosclerosis (ICD10-I70.0). These results will be called to the ordering clinician or representative by the Radiologist Assistant, and communication documented in the PACS or zVision Dashboard. Electronically Signed   By: Lovena Le M.D.   On: 09/10/2019 15:18   Dg Abd Portable 1v  Result Date: 09/09/2019 CLINICAL DATA:  Abdominal distention. EXAM: PORTABLE ABDOMEN - 1 VIEW COMPARISON:  09/07/2019 FINDINGS: The bowel gas pattern is normal. No evidence of  dilated bowel loops. No radiopaque calculi identified. Vascular calcification incidentally noted. IMPRESSION: Unremarkable bowel gas pattern.  No acute findings. Electronically Signed   By: Marlaine Hind M.D.   On: 09/09/2019 15:39   . aspirin  81 mg Oral Daily  . Chlorhexidine Gluconate Cloth  6 each Topical Daily  . ferrous sulfate  325 mg Oral BID WC  . finasteride  5 mg Oral Daily  . furosemide  80 mg Intravenous BID  . heparin  5,000 Units Subcutaneous Q8H  . mouth rinse  15 mL Mouth Rinse BID  . metoprolol tartrate  5 mg Intravenous Q8H  . vitamin B-12  500 mcg Oral Daily    BMET    Component Value Date/Time   NA 147 (H) 09/10/2019 0336   K 3.0 (L) 09/10/2019 0336   CL 118 (H) 09/10/2019 0336   CO2 16 (L) 09/10/2019 0336   GLUCOSE 134 (H) 09/10/2019 0336    BUN 67 (H) 09/10/2019 0336   CREATININE 1.88 (H) 09/10/2019 0336   CALCIUM 8.6 (L) 09/10/2019 0336   GFRNONAA 29 (L) 09/10/2019 0336   GFRAA 34 (L) 09/10/2019 0336   CBC    Component Value Date/Time   WBC 8.4 09/06/2019 0328   RBC 3.23 (L) 09/06/2019 0328   HGB 9.8 (L) 09/06/2019 0328   HCT 33.6 (L) 09/06/2019 0328   PLT 226 09/06/2019 0328   MCV 104.0 (H) 09/06/2019 0328   MCH 30.3 09/06/2019 0328   MCHC 29.2 (L) 09/06/2019 0328   RDW 15.5 09/06/2019 0328   LYMPHSABS 0.7 03/18/2019 2234   MONOABS 0.7 03/18/2019 2234   EOSABS 0.0 03/18/2019 2234   BASOSABS 0.0 03/18/2019 2234     Assessment/Plan:  1. AKI/CKD- non-oliguric.  Likely hemodynamically mediated ATN.  Renal US without evidence of hydronephrosis.  BUN/Cr rising with use of escalating lasix.  Urine Na elevated. 1. CT Scan revealed severe bilateral hydronephrosis and markedly distended bladder with bladder wall irregularity concerning for possible malignancy 2. Nurse instructed to place foley catheter and will follow UOP and Scr but may need to repeat imaging if obstruction is above the bladder.   2. Hypernatremia- improved with IVF"s.   3. Metabolic acidosis- due to #1. Cont to follow 4. UTI- e coli and aerococcus- on rocephin 5. Hypokalemia 6. Anemia  7. Anasarca/CHF/volume overload- diuresing but will decrease frequency to bid rather than q8 as he has 3rd spacing.  ECHO pending.   8. Dementia  9. Disposition- pt is appropriate for DNR.  Continue with conservative medical management.  Donetta Potts, MD [employer] [phone]

## 2019-09-10 NOTE — Care Management Important Message (Signed)
Important Message  Patient Details IM Letter given to Nancy Marus RN to present to the Patient Name: Zachary Obrien MRN: [id number] Date of Birth: Oct 05, 1921   Medicare Important Message Given:  Yes     Kerin Salen 09/10/2019, 10:37 AM

## 2019-09-10 NOTE — Progress Notes (Signed)
PROGRESS NOTE    ELVERN DENBY  [id number] DOB: 12-20-1920 DOA: 09/05/2019 PCP: Josetta Huddle, MD   Brief Narrative: SHALE CLEAVENGER is a 85 y.o. male with medical history significant for bladder cancer, BPH. Patient presented from SNF secondary to concern for UTI. Found to have evidence of dehydration. Empiric treatment for UTI started.   Assessment & Plan:   Principal Problem:   Acute kidney injury ([place]) Active Problems:   AKI (acute kidney injury) ([place])   Hypernatremia   Hypernatremia Difficult to manage. Some improvement from admission with combination of fluids/lasix but now stable. Mental status appears to be improved -Watch BMP -Lasix -Nephrology consult  AKI Likely related to dehydration. Baseline creatinine appears to be about 1. Creatinine of 1.52 on admission and initially improved with IV fluids. Renal ultrasound unremarkable. Creatinine now worsening after IV fluids which were discontinued. On lasix now secondary to AKI in setting of anasarca. -BMP as above -Nephrology recommendations: Lasix  Dehydration Appears patient has had decreased oral intake. Overall appears to be having some failure to thrive since being at a facility during quarantine for COVID diagnosis.  Fluid overload In part secondary to fluid resuscitation. Possible component of heart failure. Weight appears to be down 4 lbs. -Increase to Lasix 40 mg daily, recheck BMP and possible switch to BID dosing for this evening  Combined systolic and diastolic heart failure Patient with fluid overload. No respiratory compromise. Appears to have some acute exacerbation in setting of IV fluid resuscitation -Daily weights/strict in and out  Aspiration concern Speech therapy consulted and is recommending a dysphagia 3 diet  UTI Urine culture significant for E. Coli and Aerococcus species. Ceftriaxone started empirically. Treated for 5 days.  BPH -Continue finasteride  Abdominal distension  Possibly secondary anasarca but want to rule out GI causes in setting of decreased stool output. Patient is asymptomatic.  Pressure injury Mid/upper sacrum, poa   DVT prophylaxis: Heparin Code Status:   Code Status: DNR Family Communication: None Disposition Plan: Discharge back to facility pending improvement of fluid balance   Consultants:   Nephrology  Procedures:   10/31: Transthoracic Echocardiogram IMPRESSIONS    1. Left ventricular ejection fraction, by visual estimation, is 45 to 50%. The left ventricle has mildly decreased function. There is moderately increased left ventricular hypertrophy.  2. Left ventricular diastolic parameters are consistent with Grade II diastolic dysfunction (pseudonormalization).  3. Global right ventricle has normal systolic function.The right ventricular size is normal. No increase in right ventricular wall thickness.  4. Left atrial size was normal.  5. Right atrial size was normal.  6. The mitral valve is normal in structure. No evidence of mitral valve regurgitation. No evidence of mitral stenosis.  7. The tricuspid valve is normal in structure. Tricuspid valve regurgitation is mild.  8. The aortic valve is tricuspid. Aortic valve regurgitation is mild. Mild to moderate aortic valve sclerosis/calcification without any evidence of aortic stenosis.  9. There is Moderate calcification of the aortic valve. 10. There is Moderate thickening of the aortic valve. 11. The pulmonic valve was normal in structure. Pulmonic valve regurgitation is trivial. 12. There is mild dilatation of the ascending aorta measuring 40 mm. 13. Normal pulmonary artery systolic pressure. 14. The inferior vena cava is normal in size with greater than 50% respiratory variability, suggesting right atrial pressure of 3 mmHg.  FINDINGS  Left Ventricle: Left ventricular ejection fraction, by visual estimation, is 45 to 50%. The left ventricle has mildly decreased function.  The left  ventricle is not well visualized. There is moderately increased left ventricular hypertrophy. Septal left  ventricular hypertrophy. Left ventricular diastolic parameters are consistent with Grade II diastolic dysfunction (pseudonormalization). Normal left atrial pressure.  Right Ventricle: The right ventricular size is normal. No increase in right ventricular wall thickness. Global RV systolic function is has normal systolic function. The tricuspid regurgitant velocity is 2.34 m/s, and with an assumed right atrial pressure  of 3 mmHg, the estimated right ventricular systolic pressure is normal at 24.9 mmHg.  Left Atrium: Left atrial size was normal in size.  Right Atrium: Right atrial size was normal in size  Pericardium: There is no evidence of pericardial effusion.  Mitral Valve: The mitral valve is normal in structure. No evidence of mitral valve stenosis by observation. No evidence of mitral valve regurgitation.  Tricuspid Valve: The tricuspid valve is normal in structure. Tricuspid valve regurgitation is mild.  Aortic Valve: The aortic valve is tricuspid. . There is Moderate thickening and Moderate calcification of the aortic valve. Aortic valve regurgitation is mild. Aortic regurgitation PHT measures 236 msec. Mild to moderate aortic valve  sclerosis/calcification is present, without any evidence of aortic stenosis. There is Moderate thickening of the aortic valve. Moderate calcification. Aortic valve mean gradient measures 4.0 mmHg. Aortic valve peak gradient measures 8.3 mmHg. Aortic  valve area, by VTI measures 3.70 cm.  Pulmonic Valve: The pulmonic valve was normal in structure. Pulmonic valve regurgitation is trivial.  Aorta: The aortic root, ascending aorta and aortic arch are all structurally normal, with no evidence of dilitation or obstruction. There is mild dilatation of the ascending aorta measuring 40 mm.  Venous: The inferior vena cava is normal in size  with greater than 50% respiratory variability, suggesting right atrial pressure of 3 mmHg.  IAS/Shunts: No atrial level shunt detected by color flow Doppler. No ventricular septal defect is seen or detected. There is no evidence of an atrial septal defect.  Antimicrobials:  Ceftriaxone    Subjective: No issues overnight  Objective: Vitals:   09/09/19 2209 09/10/19 0500 09/10/19 0605 09/10/19 1331  BP: 122/71  102/67 101/81  Pulse: 99  97 (!) 110  Resp: 18  17 16   Temp: 97.8 F (36.6 C)  97.8 F (36.6 C) (!) 97.4 F (36.3 C)  TempSrc: Axillary  Oral Oral  SpO2: 99%  99% 99%  Weight:  75.8 kg      Intake/Output Summary (Last 24 hours) at 09/10/2019 1339 Last data filed at 09/10/2019 0842 Gross per 24 hour  Intake 940 ml  Output 2600 ml  Net -1660 ml   Filed Weights   09/08/19 0500 09/09/19 0457 09/10/19 0500  Weight: 75.4 kg 73.8 kg 75.8 kg    Examination:  General exam: Appears calm and comfortable Respiratory system: Clear to auscultation. Respiratory effort normal. Cardiovascular system: S1 & S2 heard, RRR. No murmurs, rubs, gallops or clicks. Gastrointestinal system: Abdomen is mildly distended, soft and nontender. Normal bowel sounds heard. Central nervous system: Sleeping Extremities: 2+ LE edema (improved). No calf tenderness Skin: No cyanosis. No rashes  Data Reviewed: I have personally reviewed following labs and imaging studies  CBC: Recent Labs  Lab 09/05/19 1710 09/06/19 0328  WBC 9.8 8.4  HGB 9.9* 9.8*  HCT 34.0* 33.6*  MCV 103.7* 104.0*  PLT 255 [id number]   Basic Metabolic Panel: Recent Labs  Lab 09/06/19 0328  09/08/19 1128 09/08/19 1700 09/09/19 0309 09/09/19 1140 09/10/19 0336  NA 153*   < > 150* 150* 149*  150* 147*  K 3.5   < > 3.4* 3.6 3.6 3.3* 3.0*  CL 125*   < > 121* 121* 120* 121* 118*  CO2 19*   < > 20* 19* 19* 19* 16*  GLUCOSE 163*   < > 123* 100* 113* 162* 134*  BUN 65*   < > 68* 65* 66* 64* 67*  CREATININE 1.27*   < > 1.65*  1.63* 1.64* 1.69* 1.88*  CALCIUM 8.5*   < > 8.6* 8.7* 8.7* 8.7* 8.6*  MG 2.0  --   --   --   --   --   --   PHOS 3.4  --   --   --   --   --   --    < > = values in this interval not displayed.   GFR: Estimated Creatinine Clearance: 23.7 mL/min (A) (by C-G formula based on SCr of 1.88 mg/dL (H)). Liver Function Tests: Recent Labs  Lab 09/05/19 1710 09/10/19 0336  AST 22 25  ALT 31 39  ALKPHOS 70 84  BILITOT 0.6 0.3  PROT 7.5 7.4  ALBUMIN 2.8* 2.5*   No results for input(s): LIPASE, AMYLASE in the last 168 hours. No results for input(s): AMMONIA in the last 168 hours. Coagulation Profile: No results for input(s): INR, PROTIME in the last 168 hours. Cardiac Enzymes: No results for input(s): CKTOTAL, CKMB, CKMBINDEX, TROPONINI in the last 168 hours. BNP (last 3 results) No results for input(s): PROBNP in the last 8760 hours. HbA1C: No results for input(s): HGBA1C in the last 72 hours. CBG: No results for input(s): GLUCAP in the last 168 hours. Lipid Profile: No results for input(s): CHOL, HDL, LDLCALC, TRIG, CHOLHDL, LDLDIRECT in the last 72 hours. Thyroid Function Tests: No results for input(s): TSH, T4TOTAL, FREET4, T3FREE, THYROIDAB in the last 72 hours. Anemia Panel: No results for input(s): VITAMINB12, FOLATE, FERRITIN, TIBC, IRON, RETICCTPCT in the last 72 hours. Sepsis Labs: Recent Labs  Lab 09/05/19 1711  LATICACIDVEN 1.5    Recent Results (from the past 240 hour(s))  SARS CORONAVIRUS 2 (TAT 6-24 HRS) Nasopharyngeal Nasopharyngeal Swab     Status: None   Collection Time: 09/05/19  4:37 PM   Specimen: Nasopharyngeal Swab  Result Value Ref Range Status   SARS Coronavirus 2 NEGATIVE NEGATIVE Final    Comment: (NOTE) SARS-CoV-2 target nucleic acids are NOT DETECTED. The SARS-CoV-2 RNA is generally detectable in upper and lower respiratory specimens during the acute phase of infection. Negative results do not preclude SARS-CoV-2 infection, do not rule out  co-infections with other pathogens, and should not be used as the sole basis for treatment or other patient management decisions. Negative results must be combined with clinical observations, patient history, and epidemiological information. The expected result is Negative. Fact Sheet for Patients: SugarRoll.be Fact Sheet for Healthcare Providers: https://www.woods-mathews.com/ This test is not yet approved or cleared by the [place] FDA and  has been authorized for detection and/or diagnosis of SARS-CoV-2 by FDA under an Emergency Use Authorization (EUA). This EUA will remain  in effect (meaning this test can be used) for the duration of the COVID-19 declaration under Section 56 4(b)(1) of the Act, 21 U.S.C. section 360bbb-3(b)(1), unless the authorization is terminated or revoked sooner. Performed at [facility], [place] [address], [place], [place] [address]   Culture, blood (single) w Reflex to ID Panel     Status: None   Collection Time: 09/05/19  5:09 PM   Specimen: BLOOD  Result Value Ref Range Status   Specimen Description   Final    BLOOD RIGHT ARM Performed at [place] [address], [place], [place] [address]    Special Requests   Final    BOTTLES DRAWN AEROBIC AND ANAEROBIC Blood Culture adequate volume Performed at [place] [address] [place], [place] [address]    Culture   Final    NO GROWTH 5 DAYS Performed at [facility], [place] [address], [place], [place] [address]    Report Status 09/10/2019 FINAL  Final  Culture, Urine     Status: Abnormal   Collection Time: 09/05/19  6:00 PM   Specimen: Urine, Clean Catch  Result Value Ref Range Status   Specimen Description   Final    URINE, CLEAN CATCH Performed at [facility], [place] [address] [place], [place] [address]    Special Requests   Final    NONE Performed at [facility], [place] [address], [place], [place] [address]    Culture (A)  Final    >=100,000 COLONIES/mL ESCHERICHIA COLI >=100,000 COLONIES/mL AEROCOCCUS SPECIES Standardized susceptibility testing for this organism is not available. Performed at [facility], [place] [address], [place], [place] [address]    Report Status 09/08/2019 FINAL  Final   Organism ID, Bacteria ESCHERICHIA COLI (A)  Final      Susceptibility   Escherichia coli - MIC*    AMPICILLIN 8 SENSITIVE Sensitive     CEFAZOLIN <=4 SENSITIVE Sensitive     CEFTRIAXONE <=1 SENSITIVE Sensitive     CIPROFLOXACIN <=0.25 SENSITIVE Sensitive     GENTAMICIN <=1 SENSITIVE Sensitive     IMIPENEM <=0.25 SENSITIVE Sensitive     NITROFURANTOIN <=16 SENSITIVE Sensitive     TRIMETH/SULFA >=320 RESISTANT Resistant     AMPICILLIN/SULBACTAM <=2 SENSITIVE Sensitive     PIP/TAZO <=4 SENSITIVE Sensitive     Extended ESBL NEGATIVE Sensitive     * >=100,000 COLONIES/mL ESCHERICHIA COLI  MRSA PCR Screening     Status: Abnormal   Collection Time: 09/09/19  3:17 PM   Specimen: Nasal Mucosa; Nasopharyngeal  Result Value Ref Range Status   MRSA by PCR POSITIVE (A) NEGATIVE Final    Comment:        The GeneXpert MRSA Assay (FDA approved for NASAL specimens only), is one component of a comprehensive MRSA colonization surveillance program. It is not intended to diagnose MRSA infection nor to guide or monitor treatment for MRSA infections. RESULT CALLED TO, READ BACK BY AND VERIFIED WITH: T.THURMAN,RN [id number] @1158  BY V.WILKINS Performed at [facility] [address], [place],  [address]          Radiology Studies: Dg Abd Portable 1v  Result Date: 09/09/2019 CLINICAL DATA:  Abdominal distention. EXAM: PORTABLE ABDOMEN - 1 VIEW COMPARISON:  09/07/2019 FINDINGS: The bowel gas pattern is normal. No evidence of dilated bowel loops. No radiopaque calculi identified. Vascular calcification incidentally noted.  IMPRESSION: Unremarkable bowel gas pattern.  No acute findings. Electronically Signed   By: Marlaine Hind M.D.   On: 09/09/2019 15:39        Scheduled Meds: . aspirin  81 mg Oral Daily  . ferrous sulfate  325 mg Oral BID WC  . finasteride  5 mg Oral Daily  . furosemide  80 mg Intravenous BID  . heparin  5,000 Units Subcutaneous Q8H  . mouth rinse  15 mL Mouth Rinse BID  .  metoprolol tartrate  5 mg Intravenous Q8H  . vitamin B-12  500 mcg Oral Daily   Continuous Infusions: . cefTRIAXone (ROCEPHIN)  IV 1 g (09/10/19 0434)     LOS: 4 days     Cordelia Poche, MD [facility] 09/10/2019, 1:39 PM  If 7PM-7AM, please contact night-coverage [email or web address]

## 2019-09-10 NOTE — NC FL2 (Signed)
Startup LEVEL OF CARE SCREENING TOOL     IDENTIFICATION  Patient Name: Zachary Obrien Birthdate: 1921-07-23 Sex: male Admission Date (Current Location): 09/05/2019  [facility] and [place] Number:  Herbalist and Address:  [facility],  [place], [place]      Provider Number: [id number]  Attending Physician Name and Address:  Mariel Aloe, MD  Relative Name and Phone Number:       Current Level of Care: Hospital Recommended Level of Care: [place] Prior Approval Number:    Date Approved/Denied:   PASRR Number:    Discharge Plan: SNF    Current Diagnoses: Patient Active Problem List   Diagnosis Date Noted  . Acute kidney injury ([place]) 09/05/2019  . Hypernatremia 09/05/2019  . COVID-19 virus infection 03/18/2019  . UTI (urinary tract infection) 03/18/2019  . Fall 03/18/2019  . Sinus tachycardia 03/18/2019  . AKI (acute kidney injury) ([place]) 03/18/2019  . CAP (community acquired pneumonia) 11/08/2015  . Pneumonia 11/08/2015  . Dermatophytosis of nail   . Bladder cancer ([place]) 10/25/2012    Orientation RESPIRATION BLADDER Height & Weight     Self   Normal Incontinent Weight: 167 lb 1.7 oz (75.8 kg) Height:     BEHAVIORAL SYMPTOMS/MOOD NEUROLOGICAL BOWEL NUTRITION STATUS      Incontinent  Dysphagia 3  AMBULATORY STATUS COMMUNICATION OF NEEDS Skin   Extensive Assist Verbally PU Stage and Appropriate Care(Stage III pressure injury, Sacrum Medical Upper, full thickness tissue loss subcutaneous fat visible.)   PU Stage 2 Dressing: Daily                   Personal Care Assistance Level of Assistance  Bathing, Feeding, Dressing Bathing Assistance: Maximum assistance Feeding assistance: Independent Dressing Assistance: Maximum assistance     Functional Limitations Info  Sight, Hearing, Speech Sight Info: Impaired(Wears Glasses) Hearing Info: Adequate Speech Info: Adequate    SPECIAL  CARE FACTORS FREQUENCY  PT (By licensed PT), OT (By licensed OT)     PT Frequency: 5x/week OT Frequency: 5x/week            Contractures Contractures Info: Not present    Additional Factors Info  Code Status, Allergies, Psychotropic Code Status Info: DNR Allergies Info: Allergies: No Known Allergies           Current Medications (09/10/2019):  This is the current hospital active medication list Current Facility-Administered Medications  Medication Dose Route Frequency Provider Last Rate Last Dose  . acetaminophen (TYLENOL) tablet 650 mg  650 mg Oral Q6H PRN Lenore Cordia, MD       Or  . acetaminophen (TYLENOL) suppository 650 mg  650 mg Rectal Q6H PRN Zada Finders R, MD      . aspirin chewable tablet 81 mg  81 mg Oral Daily Lenore Cordia, MD   81 mg at 09/10/19 [id number]  . ferrous sulfate tablet 325 mg  325 mg Oral BID WC Lenore Cordia, MD   325 mg at 09/10/19 0811  . finasteride (PROSCAR) tablet 5 mg  5 mg Oral Daily Zada Finders R, MD   5 mg at 09/10/19 0811  . furosemide (LASIX) injection 80 mg  80 mg Intravenous BID Donato Heinz, MD   80 mg at 09/10/19 1220  . heparin injection 5,000 Units  5,000 Units Subcutaneous Q8H Lenore Cordia, MD   5,000 Units at 09/10/19 0606  . MEDLINE mouth rinse  15 mL Mouth  Rinse BID Mariel Aloe, MD   15 mL at 09/09/19 2212  . metoprolol tartrate (LOPRESSOR) injection 5 mg  5 mg Intravenous Q8H Mariel Aloe, MD   Stopped at 09/10/19 [phone]  . Resource ThickenUp Clear   Oral PRN Mariel Aloe, MD      . vitamin B-12 (CYANOCOBALAMIN) tablet 500 mcg  500 mcg Oral Daily Lenore Cordia, MD   500 mcg at 09/10/19 [id number]     Discharge Medications: Please see discharge summary for a list of discharge medications.  Relevant Imaging Results:  Relevant Lab Results:   Additional Information SSN:[id number]  Lia Hopping, LCSW

## 2019-09-11 DIAGNOSIS — R140 Abdominal distension (gaseous): Secondary | ICD-10-CM

## 2019-09-11 DIAGNOSIS — R531 Weakness: Secondary | ICD-10-CM

## 2019-09-11 LAB — CBC
HCT: 34.3 % — ABNORMAL LOW (ref 39.0–52.0)
Hemoglobin: 10.1 g/dL — ABNORMAL LOW (ref 13.0–17.0)
MCH: 30.2 pg (ref 26.0–34.0)
MCHC: 29.4 g/dL — ABNORMAL LOW (ref 30.0–36.0)
MCV: 102.7 fL — ABNORMAL HIGH (ref 80.0–100.0)
Platelets: 222 10*3/uL (ref 150–400)
RBC: 3.34 MIL/uL — ABNORMAL LOW (ref 4.22–5.81)
RDW: 14.9 % (ref 11.5–15.5)
WBC: 7.8 10*3/uL (ref 4.0–10.5)
nRBC: 0 % (ref 0.0–0.2)

## 2019-09-11 LAB — RENAL FUNCTION PANEL
Albumin: 2.6 g/dL — ABNORMAL LOW (ref 3.5–5.0)
Anion gap: 11 (ref 5–15)
BUN: 66 mg/dL — ABNORMAL HIGH (ref 8–23)
CO2: 20 mmol/L — ABNORMAL LOW (ref 22–32)
Calcium: 8.9 mg/dL (ref 8.9–10.3)
Chloride: 115 mmol/L — ABNORMAL HIGH (ref 98–111)
Creatinine, Ser: 1.89 mg/dL — ABNORMAL HIGH (ref 0.61–1.24)
GFR calc Af Amer: 34 mL/min — ABNORMAL LOW (ref >60)
GFR calc non Af Amer: 29 mL/min — ABNORMAL LOW (ref >60)
Glucose, Bld: 138 mg/dL — ABNORMAL HIGH (ref 70–99)
Phosphorus: 4.7 mg/dL — ABNORMAL HIGH (ref 2.5–4.6)
Potassium: 3.5 mmol/L (ref 3.5–5.1)
Sodium: 146 mmol/L — ABNORMAL HIGH (ref 135–145)

## 2019-09-11 NOTE — Progress Notes (Signed)
SLP Cancellation Note  Patient Details Name: ARJENIS INDOVINA MRN: [id number] DOB: 1921/07/20   Cancelled treatment:       Reason Eval/Treat Not Completed: Other (comment)(RN reports she has not been able to wake pt adequately this am for po)   Macario Golds 09/11/2019, 10:18 AM  Luanna Salk, [place] SLP Acute Rehab Services Pager [phone] Office [phone]

## 2019-09-11 NOTE — Progress Notes (Addendum)
PROGRESS NOTE    Zachary Obrien  [id number] DOB: [birth year] DOA: 09/05/2019 PCP: Josetta Huddle, MD   Brief Narrative: Zachary Obrien is a 85 y.o. male with medical history significant for bladder cancer, BPH. Patient presented from SNF secondary to concern for UTI. Found to have evidence of dehydration. Empiric treatment for UTI completed. 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   Assessment & Plan:   Principal Problem:   Acute kidney injury ([place]) Active Problems:   AKI (acute kidney injury) ([place])   Hypernatremia   Hypernatremia Difficult to manage. Some improvement from admission with combination of fluids/lasix but now stable. Mental status appears to be improve. Continues to improve with recent Lasix treatment  AKI Likely related to dehydration. Baseline creatinine appears to be about 1. Creatinine of 1.52 on admission and initially improved with IV fluids. Renal ultrasound unremarkable. Creatinine now worsening after IV fluids which were discontinued. Was transitioned to lasix with continued worsening. CT abdomen/pelvis significant for urinary retention and hydronephrosis. Foley placed on 11/2 -Nephrology recommendations: lasix held, foley catheter, repeat imaging on 11/4  Dehydration Appears patient has had decreased oral intake. Overall appears to be having some failure to thrive since being at a facility during quarantine for COVID diagnosis.  Fluid overload In part secondary to fluid resuscitation. Possible component of heart failure vs hypoalbuminemia. Treated with lasix but appears to have reached maximum diuresis in setting of soft blood pressures.  Combined systolic and diastolic heart failure Patient with fluid overload. No respiratory compromise. Appears to have some acute exacerbation in setting of IV fluid resuscitation -Daily weights/strict in and out  Aspiration concern Speech therapy consulted and is  recommending a dysphagia 3 diet  UTI Urine culture significant for E. Coli and Aerococcus species. Ceftriaxone started empirically. Treated for 5 days.  BPH -Continue finasteride  Abdominal distension Possibly secondary anasarca but want to rule out GI causes in setting of decreased stool output. Patient is asymptomatic.  Possible osteomyelitis In setting of sacral decubitus ulcer. Currently stable. Discussed possible diagnosis with son. For now, will hold on treatment. Palliative care consulted. Will likely pursue more comfort approach. -Palliative care recommendations  Mesenteric hazy stranding Associated numerous reactive lymph nodes. Concern for mesenteritis. Patient is currently asymptomatic.  -Goals of care discussions prior to management. Patient comfortable.  Pressure injury Mid/upper sacrum, poa   DVT prophylaxis: Heparin Code Status:   Code Status: DNR Family Communication: Son on telephone Disposition Plan: Discharge back to facility pending improvement of fluid balance and [place] discussions   Consultants:   Nephrology  Procedures:   10/31: Transthoracic Echocardiogram IMPRESSIONS    1. Left ventricular ejection fraction, by visual estimation, is 45 to 50%. The left ventricle has mildly decreased function. There is moderately increased left ventricular hypertrophy.  2. Left ventricular diastolic parameters are consistent with Grade II diastolic dysfunction (pseudonormalization).  3. Global right ventricle has normal systolic function.The right ventricular size is normal. No increase in right ventricular wall thickness.  4. Left atrial size was normal.  5. Right atrial size was normal.  6. The mitral valve is normal in structure. No evidence of mitral valve regurgitation. No evidence of mitral stenosis.  7. The tricuspid valve is normal in structure. Tricuspid valve regurgitation is mild.  8. The aortic valve is tricuspid. Aortic valve regurgitation is mild.  Mild to moderate aortic valve sclerosis/calcification without any evidence of aortic stenosis.  9. There is Moderate calcification of the aortic valve. 10. There  is Moderate thickening of the aortic valve. 11. The pulmonic valve was normal in structure. Pulmonic valve regurgitation is trivial. 12. There is mild dilatation of the ascending aorta measuring 40 mm. 13. Normal pulmonary artery systolic pressure. 14. The inferior vena cava is normal in size with greater than 50% respiratory variability, suggesting right atrial pressure of 3 mmHg.  FINDINGS  Left Ventricle: Left ventricular ejection fraction, by visual estimation, is 45 to 50%. The left ventricle has mildly decreased function. The left ventricle is not well visualized. There is moderately increased left ventricular hypertrophy. Septal left  ventricular hypertrophy. Left ventricular diastolic parameters are consistent with Grade II diastolic dysfunction (pseudonormalization). Normal left atrial pressure.  Right Ventricle: The right ventricular size is normal. No increase in right ventricular wall thickness. Global RV systolic function is has normal systolic function. The tricuspid regurgitant velocity is 2.34 m/s, and with an assumed right atrial pressure  of 3 mmHg, the estimated right ventricular systolic pressure is normal at 24.9 mmHg.  Left Atrium: Left atrial size was normal in size.  Right Atrium: Right atrial size was normal in size  Pericardium: There is no evidence of pericardial effusion.  Mitral Valve: The mitral valve is normal in structure. No evidence of mitral valve stenosis by observation. No evidence of mitral valve regurgitation.  Tricuspid Valve: The tricuspid valve is normal in structure. Tricuspid valve regurgitation is mild.  Aortic Valve: The aortic valve is tricuspid. . There is Moderate thickening and Moderate calcification of the aortic valve. Aortic valve regurgitation is mild. Aortic  regurgitation PHT measures 236 msec. Mild to moderate aortic valve  sclerosis/calcification is present, without any evidence of aortic stenosis. There is Moderate thickening of the aortic valve. Moderate calcification. Aortic valve mean gradient measures 4.0 mmHg. Aortic valve peak gradient measures 8.3 mmHg. Aortic  valve area, by VTI measures 3.70 cm.  Pulmonic Valve: The pulmonic valve was normal in structure. Pulmonic valve regurgitation is trivial.  Aorta: The aortic root, ascending aorta and aortic arch are all structurally normal, with no evidence of dilitation or obstruction. There is mild dilatation of the ascending aorta measuring 40 mm.  Venous: The inferior vena cava is normal in size with greater than 50% respiratory variability, suggesting right atrial pressure of 3 mmHg.  IAS/Shunts: No atrial level shunt detected by color flow Doppler. No ventricular septal defect is seen or detected. There is no evidence of an atrial septal defect.  Antimicrobials:  Ceftriaxone    Subjective: No issues overnight. No concerns today.  Objective: Vitals:   09/11/19 0144 09/11/19 0529 09/11/19 0712 09/11/19 1318  BP: 103/70 97/72  105/66  Pulse:  95  100  Resp: 16 20  20   Temp:  97.9 F (36.6 C)  97.6 F (36.4 C)  TempSrc:  Oral  Oral  SpO2: 100% 100%  99%  Weight:   70 kg     Intake/Output Summary (Last 24 hours) at 09/11/2019 1717 Last data filed at 09/11/2019 1400 Gross per 24 hour  Intake 120 ml  Output 2751 ml  Net -2631 ml   Filed Weights   09/09/19 0457 09/10/19 0500 09/11/19 0712  Weight: 73.8 kg 75.8 kg 70 kg    Examination:  General exam: Appears calm and comfortable Respiratory system: Clear to auscultation. Respiratory effort normal. Cardiovascular system: S1 & S2 heard, RRR. No murmurs, rubs, gallops or clicks. Gastrointestinal system: Abdomen is nondistended, soft and nontender. No organomegaly or masses felt. Normal bowel sounds heard. Central nervous  system:  Alert. No focal neurological deficits. Extremities: 2+ LE edema up in thighs. No calf tenderness Skin: No cyanosis. No rashes  Data Reviewed: I have personally reviewed following labs and imaging studies  CBC: Recent Labs  Lab 09/05/19 1710 09/06/19 0328 09/11/19 0326  WBC 9.8 8.4 7.8  HGB 9.9* 9.8* 10.1*  HCT 34.0* 33.6* 34.3*  MCV 103.7* 104.0* 102.7*  PLT 255 226 [id number]   Basic Metabolic Panel: Recent Labs  Lab 09/06/19 0328  09/08/19 1700 09/09/19 0309 09/09/19 1140 09/10/19 0336 09/11/19 0326  NA 153*   < > 150* 149* 150* 147* 146*  K 3.5   < > 3.6 3.6 3.3* 3.0* 3.5  CL 125*   < > 121* 120* 121* 118* 115*  CO2 19*   < > 19* 19* 19* 16* 20*  GLUCOSE 163*   < > 100* 113* 162* 134* 138*  BUN 65*   < > 65* 66* 64* 67* 66*  CREATININE 1.27*   < > 1.63* 1.64* 1.69* 1.88* 1.89*  CALCIUM 8.5*   < > 8.7* 8.7* 8.7* 8.6* 8.9  MG 2.0  --   --   --   --   --   --   PHOS 3.4  --   --   --   --   --  4.7*   < > = values in this interval not displayed.   GFR: Estimated Creatinine Clearance: 22.6 mL/min (A) (by C-G formula based on SCr of 1.89 mg/dL (H)). Liver Function Tests: Recent Labs  Lab 09/05/19 1710 09/10/19 0336 09/11/19 0326  AST 22 25  --   ALT 31 39  --   ALKPHOS 70 84  --   BILITOT 0.6 0.3  --   PROT 7.5 7.4  --   ALBUMIN 2.8* 2.5* 2.6*   No results for input(s): LIPASE, AMYLASE in the last 168 hours. No results for input(s): AMMONIA in the last 168 hours. Coagulation Profile: No results for input(s): INR, PROTIME in the last 168 hours. Cardiac Enzymes: No results for input(s): CKTOTAL, CKMB, CKMBINDEX, TROPONINI in the last 168 hours. BNP (last 3 results) No results for input(s): PROBNP in the last 8760 hours. HbA1C: No results for input(s): HGBA1C in the last 72 hours. CBG: No results for input(s): GLUCAP in the last 168 hours. Lipid Profile: No results for input(s): CHOL, HDL, LDLCALC, TRIG, CHOLHDL, LDLDIRECT in the last 72 hours. Thyroid  Function Tests: No results for input(s): TSH, T4TOTAL, FREET4, T3FREE, THYROIDAB in the last 72 hours. Anemia Panel: No results for input(s): VITAMINB12, FOLATE, FERRITIN, TIBC, IRON, RETICCTPCT in the last 72 hours. Sepsis Labs: Recent Labs  Lab 09/05/19 1711  LATICACIDVEN 1.5    Recent Results (from the past 240 hour(s))  SARS CORONAVIRUS 2 (TAT 6-24 HRS) Nasopharyngeal Nasopharyngeal Swab     Status: None   Collection Time: 09/05/19  4:37 PM   Specimen: Nasopharyngeal Swab  Result Value Ref Range Status   SARS Coronavirus 2 NEGATIVE NEGATIVE Final    Comment: (NOTE) SARS-CoV-2 target nucleic acids are NOT DETECTED. The SARS-CoV-2 RNA is generally detectable in upper and lower respiratory specimens during the acute phase of infection. Negative results do not preclude SARS-CoV-2 infection, do not rule out co-infections with other pathogens, and should not be used as the sole basis for treatment or other patient management decisions. Negative results must be combined with clinical observations, patient history, and epidemiological information. The expected result is Negative. Fact Sheet for Patients: SugarRoll.be Fact Sheet for  Healthcare Providers: https://www.woods-mathews.com/ This test is not yet approved or cleared by the [place] and  has been authorized for detection and/or diagnosis of SARS-CoV-2 by FDA under an Emergency Use Authorization (EUA). This EUA will remain  in effect (meaning this test can be used) for the duration of the COVID-19 declaration under Section 56 4(b)(1) of the Act, 21 U.S.C. section 360bbb-3(b)(1), unless the authorization is terminated or revoked sooner. Performed at [facility], [place] [address], [place], [place] [address]   Culture, blood (single) w Reflex to ID Panel     Status: None   Collection Time: 09/05/19  5:09 PM   Specimen: BLOOD  Result Value Ref Range Status   Specimen  Description   Final    BLOOD RIGHT ARM Performed at [place] [address], [place], [place] [address]    Special Requests   Final    BOTTLES DRAWN AEROBIC AND ANAEROBIC Blood Culture adequate volume Performed at [place] [address], [place], [place] [address]    Culture   Final    NO GROWTH 5 DAYS Performed at [facility], [place] [address] [place], [place] [address]    Report Status 09/10/2019 FINAL  Final  Culture, Urine     Status: Abnormal   Collection Time: 09/05/19  6:00 PM   Specimen: Urine, Clean Catch  Result Value Ref Range Status   Specimen Description   Final    URINE, CLEAN CATCH Performed at [facility], [place] [address], [place], [place] [address]    Special Requests   Final    NONE Performed at [facility], [place] [address], [place], [place] [address]    Culture (A)  Final    >=100,000 COLONIES/mL ESCHERICHIA COLI >=100,000 COLONIES/mL AEROCOCCUS SPECIES Standardized susceptibility testing for this organism is not available. Performed at [facility], [place] [address] [place], [place] [address]    Report Status 09/08/2019 FINAL  Final   Organism ID, Bacteria ESCHERICHIA COLI (A)  Final      Susceptibility   Escherichia coli - MIC*    AMPICILLIN 8 SENSITIVE Sensitive     CEFAZOLIN <=4 SENSITIVE Sensitive     CEFTRIAXONE <=1 SENSITIVE Sensitive     CIPROFLOXACIN <=0.25 SENSITIVE Sensitive     GENTAMICIN <=1 SENSITIVE Sensitive     IMIPENEM <=0.25 SENSITIVE Sensitive     NITROFURANTOIN <=16 SENSITIVE Sensitive     TRIMETH/SULFA >=320 RESISTANT Resistant     AMPICILLIN/SULBACTAM <=2 SENSITIVE Sensitive     PIP/TAZO <=4 SENSITIVE Sensitive     Extended ESBL NEGATIVE Sensitive     * >=100,000 COLONIES/mL ESCHERICHIA COLI  MRSA PCR Screening     Status: Abnormal   Collection Time: 09/09/19  3:17 PM   Specimen: Nasal Mucosa; Nasopharyngeal  Result Value Ref  Range Status   MRSA by PCR POSITIVE (A) NEGATIVE Final    Comment:        The GeneXpert MRSA Assay (FDA approved for NASAL specimens only), is one component of a comprehensive MRSA colonization surveillance program. It is not intended to diagnose MRSA infection nor to guide or monitor treatment for MRSA infections. RESULT CALLED TO, READ BACK BY AND VERIFIED WITH: T.THURMAN,RN [id number] @1158  BY V.WILKINS Performed at [place] [address], [place], [place] [address]          Radiology Studies: Ct Abdomen Pelvis Wo Contrast  Result Date: 09/10/2019 CLINICAL  DATA:  Concern for UTI, dehydration, history of bladder cancer EXAM: CT ABDOMEN AND PELVIS WITHOUT CONTRAST TECHNIQUE: Multidetector CT imaging of the abdomen and pelvis was performed following the standard protocol without IV contrast. COMPARISON:  CT abdomen pelvis 08/12/2014, CTA chest 03/19/2019 FINDINGS: Lower chest: Basilar bronchiectatic changes and mucous plugging within the airways of the right left lower lobes. Some adjacent ground-glass opacity. Mild cardiomegaly. Coronary artery calcifications. Calcifications on the aortic leaflets. No pericardial effusion. Hepatobiliary: Punctate calcifications in the otherwise normal liver, likely sequela of granulomatous disease. Gallbladder distention with few partially calcified dependently layering gallstones. No visible intraductal gallstones. No biliary ductal dilatation. Pancreas: Fatty atrophy of the pancreas. No concerning pancreatic lesions or peripancreatic inflammation. No pancreatic ductal dilatation. Spleen: Normal in size without focal abnormality. Adrenals/Urinary Tract: Normal adrenal glands. Stable hyperdense cysts in the upper pole right kidney. Largest measuring 12 mm in size (2/39. Larger fluid attenuation cyst measuring up to 8.2 cm, and creased size from comparison exam large 7.3 cm exophytic lower pole right renal cyst is unchanged from prior.  Multiple smaller cysts are seen in both kidneys. Asymmetric atrophy of the left kidney is present. There is severe bilateral hydroureteronephrosis to the level of the severely distended bladder which demonstrates some asymmetric posterior bladder wall thickening. Additionally there is indentation of the bladder base by the enlarged prostate. Mild circumferential bladder wall thickening. Stomach/Bowel: Distal esophagus, stomach and duodenal sweep are unremarkable. No small bowel wall thickening or dilatation. Fecalization of the distal small bowel contents. A normal appendix is visualized. No colonic dilatation or wall thickening. Scattered colonic diverticula without focal pericolonic inflammation to suggest diverticulitis. Inspissated stool ball at the rectum with some pericolonic stranding and presacral stranding. Vascular/Lymphatic: Atherosclerotic plaque within the normal caliber aorta. Focal region of mid mesenteric hazy stranding with numerous reactive appearing clustered mid mesenteric lymph nodes compatible with mesenteritis. Few reactive nodes are present in the upper abdomen. Reproductive: Borderline prostatomegaly with few coarse calcifications. Other: Skin thickening superficial to the sacrum and coccyx with underlying subcutaneous gas tracking to the level of the bone with some associated lucent features. Presacral stranding seen in close vicinity. Circumferential body wall edema. No bowel containing hernias. Musculoskeletal: Lucent changes of the coccyx near region of presacral fat stranding. Multilevel degenerative changes are present in the imaged portions of the spine. Additional degenerative changes noted in the SI joints hips. IMPRESSION: 1. Severe bilateral hydroureteronephrosis to the level of the markedly distended bladder with asymmetric posterior bladder wall thickening concerning for an underlying malignancy on a background chronic outlet obstruction. 2. Skin thickening superficial to the  sacrum and coccyx with underlying subcutaneous gas tracking to the level of the bone with some associated lucent features, concerning for a sacral decubitus ulcer with features concerning for osteomyelitis. 3. Inspissated stool ball at the rectum. Perirectal and presacral stranding could reflect a stercoral colitis or be secondary to the decubitus process, as detailed above. 4. Fecalization of the distal small bowel contents, suggestive of delayed transit. 5. Focal region of mid mesenteric hazy stranding with numerous reactive appearing clustered mid mesenteric lymph nodes compatible with mesenteritis. 6. Basilar bronchiectasis with areas of mucous plugging and features suggestive for a more acute infectious or inflammatory process in the right lung base. 7. Aortic Atherosclerosis (ICD10-I70.0). These results will be called to the ordering clinician or representative by the Radiologist Assistant, and communication documented in the PACS or zVision Dashboard. Electronically Signed   By: Lovena Le M.D.   On: 09/10/2019 15:18  Scheduled Meds:  aspirin  81 mg Oral Daily   Chlorhexidine Gluconate Cloth  6 each Topical Daily   Chlorhexidine Gluconate Cloth  6 each Topical Q0600   ferrous sulfate  325 mg Oral BID WC   finasteride  5 mg Oral Daily   heparin  5,000 Units Subcutaneous Q8H   mouth rinse  15 mL Mouth Rinse BID   metoprolol tartrate  5 mg Intravenous Q8H   mupirocin ointment  1 application Nasal BID   vitamin B-12  500 mcg Oral Daily   Continuous Infusions:    LOS: 5 days     Cordelia Poche, MD [facility] 09/11/2019, 5:17 PM  If 7PM-7AM, please contact night-coverage [email or web address]

## 2019-09-11 NOTE — Progress Notes (Signed)
Patient ID: Zachary Obrien, male   DOB: 1921-01-06, 85 y.o.   MRN: [id number] S: Events overnight noted.  Foley catheter in place O:BP 97/72 (BP Location: Left Arm)   Pulse 95   Temp 97.9 F (36.6 C) (Oral)   Resp 20   Wt 70 kg   SpO2 100%   BMI 22.14 kg/m   Intake/Output Summary (Last 24 hours) at 09/11/2019 1225 Last data filed at 09/11/2019 1134 Gross per 24 hour  Intake 300 ml  Output 5376 ml  Net -5076 ml   Intake/Output: I/O last 3 completed shifts: In: 1000 [P.O.:800; IV Piggyback:200] Out: 7176 [Urine:7175; Stool:1]  Intake/Output this shift:  Total I/O In: 0  Out: 600 [Urine:600] Weight change:  Gen: frail elderly WM in NAD CVS: no rub Resp: cta Abd: +BS, soft, NT/ND [id number] edema  Recent Labs  Lab 09/05/19 1710 09/06/19 0328  09/08/19 0325 09/08/19 1128 09/08/19 1700 09/09/19 0309 09/09/19 1140 09/10/19 0336 09/11/19 0326  NA 152* 153*   < > 148* 150* 150* 149* 150* 147* 146*  K 3.5 3.5   < > 3.5 3.4* 3.6 3.6 3.3* 3.0* 3.5  CL 125* 125*   < > 120* 121* 121* 120* 121* 118* 115*  CO2 19* 19*   < > 18* 20* 19* 19* 19* 16* 20*  GLUCOSE 116* 163*   < > 128* 123* 100* 113* 162* 134* 138*  BUN 74* 65*   < > 65* 68* 65* 66* 64* 67* 66*  CREATININE 1.52* 1.27*   < > 1.66* 1.65* 1.63* 1.64* 1.69* 1.88* 1.89*  ALBUMIN 2.8*  --   --   --   --   --   --   --  2.5* 2.6*  CALCIUM 8.8* 8.5*   < > 8.6* 8.6* 8.7* 8.7* 8.7* 8.6* 8.9  PHOS  --  3.4  --   --   --   --   --   --   --  4.7*  AST 22  --   --   --   --   --   --   --  25  --   ALT 31  --   --   --   --   --   --   --  39  --    < > = values in this interval not displayed.   Liver Function Tests: Recent Labs  Lab 09/05/19 1710 09/10/19 0336 09/11/19 0326  AST 22 25  --   ALT 31 39  --   ALKPHOS 70 84  --   BILITOT 0.6 0.3  --   PROT 7.5 7.4  --   ALBUMIN 2.8* 2.5* 2.6*   No results for input(s): LIPASE, AMYLASE in the last 168 hours. No results for input(s): AMMONIA in the last 168  hours. CBC: Recent Labs  Lab 09/05/19 1710 09/06/19 0328 09/11/19 0326  WBC 9.8 8.4 7.8  HGB 9.9* 9.8* 10.1*  HCT 34.0* 33.6* 34.3*  MCV 103.7* 104.0* 102.7*  PLT 255 226 222   Cardiac Enzymes: No results for input(s): CKTOTAL, CKMB, CKMBINDEX, TROPONINI in the last 168 hours. CBG: No results for input(s): GLUCAP in the last 168 hours.  Iron Studies: No results for input(s): IRON, TIBC, TRANSFERRIN, FERRITIN in the last 72 hours. Studies/Results: Ct Abdomen Pelvis Wo Contrast  Result Date: 09/10/2019 CLINICAL DATA:  Concern for UTI, dehydration, history of bladder cancer EXAM: CT ABDOMEN AND PELVIS WITHOUT CONTRAST TECHNIQUE: Multidetector CT imaging of  the abdomen and pelvis was performed following the standard protocol without IV contrast. COMPARISON:  CT abdomen pelvis 08/12/2014, CTA chest 03/19/2019 FINDINGS: Lower chest: Basilar bronchiectatic changes and mucous plugging within the airways of the right left lower lobes. Some adjacent ground-glass opacity. Mild cardiomegaly. Coronary artery calcifications. Calcifications on the aortic leaflets. No pericardial effusion. Hepatobiliary: Punctate calcifications in the otherwise normal liver, likely sequela of granulomatous disease. Gallbladder distention with few partially calcified dependently layering gallstones. No visible intraductal gallstones. No biliary ductal dilatation. Pancreas: Fatty atrophy of the pancreas. No concerning pancreatic lesions or peripancreatic inflammation. No pancreatic ductal dilatation. Spleen: Normal in size without focal abnormality. Adrenals/Urinary Tract: Normal adrenal glands. Stable hyperdense cysts in the upper pole right kidney. Largest measuring 12 mm in size (2/39. Larger fluid attenuation cyst measuring up to 8.2 cm, and creased size from comparison exam large 7.3 cm exophytic lower pole right renal cyst is unchanged from prior. Multiple smaller cysts are seen in both kidneys. Asymmetric atrophy of the  left kidney is present. There is severe bilateral hydroureteronephrosis to the level of the severely distended bladder which demonstrates some asymmetric posterior bladder wall thickening. Additionally there is indentation of the bladder base by the enlarged prostate. Mild circumferential bladder wall thickening. Stomach/Bowel: Distal esophagus, stomach and duodenal sweep are unremarkable. No small bowel wall thickening or dilatation. Fecalization of the distal small bowel contents. A normal appendix is visualized. No colonic dilatation or wall thickening. Scattered colonic diverticula without focal pericolonic inflammation to suggest diverticulitis. Inspissated stool ball at the rectum with some pericolonic stranding and presacral stranding. Vascular/Lymphatic: Atherosclerotic plaque within the normal caliber aorta. Focal region of mid mesenteric hazy stranding with numerous reactive appearing clustered mid mesenteric lymph nodes compatible with mesenteritis. Few reactive nodes are present in the upper abdomen. Reproductive: Borderline prostatomegaly with few coarse calcifications. Other: Skin thickening superficial to the sacrum and coccyx with underlying subcutaneous gas tracking to the level of the bone with some associated lucent features. Presacral stranding seen in close vicinity. Circumferential body wall edema. No bowel containing hernias. Musculoskeletal: Lucent changes of the coccyx near region of presacral fat stranding. Multilevel degenerative changes are present in the imaged portions of the spine. Additional degenerative changes noted in the SI joints hips. IMPRESSION: 1. Severe bilateral hydroureteronephrosis to the level of the markedly distended bladder with asymmetric posterior bladder wall thickening concerning for an underlying malignancy on a background chronic outlet obstruction. 2. Skin thickening superficial to the sacrum and coccyx with underlying subcutaneous gas tracking to the level of  the bone with some associated lucent features, concerning for a sacral decubitus ulcer with features concerning for osteomyelitis. 3. Inspissated stool ball at the rectum. Perirectal and presacral stranding could reflect a stercoral colitis or be secondary to the decubitus process, as detailed above. 4. Fecalization of the distal small bowel contents, suggestive of delayed transit. 5. Focal region of mid mesenteric hazy stranding with numerous reactive appearing clustered mid mesenteric lymph nodes compatible with mesenteritis. 6. Basilar bronchiectasis with areas of mucous plugging and features suggestive for a more acute infectious or inflammatory process in the right lung base. 7. Aortic Atherosclerosis (ICD10-I70.0). These results will be called to the ordering clinician or representative by the Radiologist Assistant, and communication documented in the PACS or zVision Dashboard. Electronically Signed   By: Lovena Le M.D.   On: 09/10/2019 15:18   Dg Abd Portable 1v  Result Date: 09/09/2019 CLINICAL DATA:  Abdominal distention. EXAM: PORTABLE ABDOMEN - 1 VIEW COMPARISON:  09/07/2019 FINDINGS: The bowel gas pattern is normal. No evidence of dilated bowel loops. No radiopaque calculi identified. Vascular calcification incidentally noted. IMPRESSION: Unremarkable bowel gas pattern.  No acute findings. Electronically Signed   By: Marlaine Hind M.D.   On: 09/09/2019 15:39   . aspirin  81 mg Oral Daily  . Chlorhexidine Gluconate Cloth  6 each Topical Daily  . Chlorhexidine Gluconate Cloth  6 each Topical Q0600  . ferrous sulfate  325 mg Oral BID WC  . finasteride  5 mg Oral Daily  . furosemide  80 mg Intravenous BID  . heparin  5,000 Units Subcutaneous Q8H  . mouth rinse  15 mL Mouth Rinse BID  . metoprolol tartrate  5 mg Intravenous Q8H  . mupirocin ointment  1 application Nasal BID  . vitamin B-12  500 mcg Oral Daily    BMET    Component Value Date/Time   NA 146 (H) 09/11/2019 0326   K 3.5  09/11/2019 0326   CL 115 (H) 09/11/2019 0326   CO2 20 (L) 09/11/2019 0326   GLUCOSE 138 (H) 09/11/2019 0326   BUN 66 (H) 09/11/2019 0326   CREATININE 1.89 (H) 09/11/2019 0326   CALCIUM 8.9 09/11/2019 0326   GFRNONAA 29 (L) 09/11/2019 0326   GFRAA 34 (L) 09/11/2019 0326   CBC    Component Value Date/Time   WBC 7.8 09/11/2019 0326   RBC 3.34 (L) 09/11/2019 0326   HGB 10.1 (L) 09/11/2019 0326   HCT 34.3 (L) 09/11/2019 0326   PLT 222 09/11/2019 0326   MCV 102.7 (H) 09/11/2019 0326   MCH 30.2 09/11/2019 0326   MCHC 29.4 (L) 09/11/2019 0326   RDW 14.9 09/11/2019 0326   LYMPHSABS 0.7 03/18/2019 2234   MONOABS 0.7 03/18/2019 2234   EOSABS 0.0 03/18/2019 2234   BASOSABS 0.0 03/18/2019 2234     Assessment/Plan:  1. AKI/CKD- non-oliguric.  Likely hemodynamically mediated ATN.  Renal US without evidence of hydronephrosis.  BUN/Cr rising with use of escalating lasix.  Urine Na elevated. 1. CT Scan revealed severe bilateral hydronephrosis and markedly distended bladder with bladder wall irregularity concerning for possible malignancy 2. S/p foley catheter placement 09/10/19 with marked improvement of UOP but no significant change in BUN/Cr. 3. Consider urology evaluation for bladder mass 4. Will check renal US tomorrow to make sure that obstruction has been relieved with foley catheter placement.   2. Hypernatremia- improved with IVF"s.   3. Metabolic acidosis- due to #1. Cont to follow 4. UTI- e coli and aerococcus- on rocephin 5. Hypokalemia 6. Anemia  7. Anasarca/CHF/volume overload- diuresing after foley catheter placed.  Will hold lasix and follow.  1. ECHO revealed EF 45-50% with moderate LVH and grade II diastolic dysfunction 2. 3rd spacing from hypoalbuminemia and protein malnutrition.   8. Moderate protein and caloric malnutrition 9. Dementia  10. Disposition- pt is appropriate for DNR.  Continue with conservative medical management.  Donetta Potts, MD [employer] [phone]

## 2019-09-12 DIAGNOSIS — L8990 Pressure ulcer of unspecified site, unspecified stage: Secondary | ICD-10-CM | POA: Insufficient documentation

## 2019-09-12 LAB — RENAL FUNCTION PANEL
Albumin: 2.4 g/dL — ABNORMAL LOW (ref 3.5–5.0)
Anion gap: 12 (ref 5–15)
BUN: 65 mg/dL — ABNORMAL HIGH (ref 8–23)
CO2: 23 mmol/L (ref 22–32)
Calcium: 8.6 mg/dL — ABNORMAL LOW (ref 8.9–10.3)
Chloride: 117 mmol/L — ABNORMAL HIGH (ref 98–111)
Creatinine, Ser: 1.74 mg/dL — ABNORMAL HIGH (ref 0.61–1.24)
GFR calc Af Amer: 38 mL/min — ABNORMAL LOW (ref >60)
GFR calc non Af Amer: 32 mL/min — ABNORMAL LOW (ref >60)
Glucose, Bld: 105 mg/dL — ABNORMAL HIGH (ref 70–99)
Phosphorus: 4.2 mg/dL (ref 2.5–4.6)
Potassium: 2.8 mmol/L — ABNORMAL LOW (ref 3.5–5.1)
Sodium: 152 mmol/L — ABNORMAL HIGH (ref 135–145)

## 2019-09-12 MED ORDER — POTASSIUM CHLORIDE CRYS ER 20 MEQ PO TBCR: 40.0000 meq | EXTENDED_RELEASE_TABLET | Freq: Once | ORAL | Status: DC

## 2019-09-12 MED ORDER — KCL IN DEXTROSE-NACL 40-5-0.45 MEQ/L-%-% IV SOLN
INTRAVENOUS | Status: DC
  Administered 2019-09-12: 15:00:00 via INTRAVENOUS
  Filled 2019-09-12 (×2): qty 1000

## 2019-09-12 MED ORDER — POTASSIUM CHLORIDE 20 MEQ PO PACK
40.0000 meq | PACK | Freq: Once | ORAL | Status: DC
  Filled 2019-09-12: qty 2

## 2019-09-12 MED ORDER — POTASSIUM CHLORIDE CRYS ER 20 MEQ PO TBCR: 40.0000 meq | EXTENDED_RELEASE_TABLET | Freq: Two times a day (BID) | ORAL | Status: DC

## 2019-09-12 MED ORDER — POTASSIUM CHLORIDE 2 MEQ/ML IV SOLN: INTRAVENOUS | Status: DC

## 2019-09-12 MED ORDER — SODIUM CHLORIDE 0.45 % IV SOLN: INTRAVENOUS | Status: DC

## 2019-09-12 NOTE — Progress Notes (Signed)
Patient ID: ZIGMUNT CAUGHELL, male   DOB: 1920/11/12, 85 y.o.   MRN: [id number] S: Marked diuresis after obstruction was relieved but not taking anything by mouth O:BP (!) 113/99 (BP Location: Left Arm)   Pulse 94   Temp 98.4 F (36.9 C) (Tympanic)   Resp 15   Wt 68.9 kg   SpO2 97%   BMI 21.79 kg/m   Intake/Output Summary (Last 24 hours) at 09/12/2019 1152 Last data filed at 09/12/2019 1011 Gross per 24 hour  Intake 150 ml  Output 1425 ml  Net -1275 ml   Intake/Output: I/O last 3 completed shifts: In: 150 [P.O.:150] Out: 3001 [Urine:3000; Stool:1]  Intake/Output this shift:  Total I/O In: 120 [P.O.:120] Out: 675 [Urine:675] Weight change:  Gen: frail, cachectic WM in NAD CVS: no rub Resp: cta Abd: +BS, soft, NT/ND Ext: trace edema  Recent Labs  Lab 09/05/19 1710 09/06/19 0328  09/08/19 1128 09/08/19 1700 09/09/19 0309 09/09/19 1140 09/10/19 0336 09/11/19 0326 09/12/19 0301  NA 152* 153*   < > 150* 150* 149* 150* 147* 146* 152*  K 3.5 3.5   < > 3.4* 3.6 3.6 3.3* 3.0* 3.5 2.8*  CL 125* 125*   < > 121* 121* 120* 121* 118* 115* 117*  CO2 19* 19*   < > 20* 19* 19* 19* 16* 20* 23  GLUCOSE 116* 163*   < > 123* 100* 113* 162* 134* 138* 105*  BUN 74* 65*   < > 68* 65* 66* 64* 67* 66* 65*  CREATININE 1.52* 1.27*   < > 1.65* 1.63* 1.64* 1.69* 1.88* 1.89* 1.74*  ALBUMIN 2.8*  --   --   --   --   --   --  2.5* 2.6* 2.4*  CALCIUM 8.8* 8.5*   < > 8.6* 8.7* 8.7* 8.7* 8.6* 8.9 8.6*  PHOS  --  3.4  --   --   --   --   --   --  4.7* 4.2  AST 22  --   --   --   --   --   --  25  --   --   ALT 31  --   --   --   --   --   --  39  --   --    < > = values in this interval not displayed.   Liver Function Tests: Recent Labs  Lab 09/05/19 1710 09/10/19 0336 09/11/19 0326 09/12/19 0301  AST 22 25  --   --   ALT 31 39  --   --   ALKPHOS 70 84  --   --   BILITOT 0.6 0.3  --   --   PROT 7.5 7.4  --   --   ALBUMIN 2.8* 2.5* 2.6* 2.4*   No results for input(s): LIPASE, AMYLASE in  the last 168 hours. No results for input(s): AMMONIA in the last 168 hours. CBC: Recent Labs  Lab 09/05/19 1710 09/06/19 0328 09/11/19 0326  WBC 9.8 8.4 7.8  HGB 9.9* 9.8* 10.1*  HCT 34.0* 33.6* 34.3*  MCV 103.7* 104.0* 102.7*  PLT 255 226 222   Cardiac Enzymes: No results for input(s): CKTOTAL, CKMB, CKMBINDEX, TROPONINI in the last 168 hours. CBG: No results for input(s): GLUCAP in the last 168 hours.  Iron Studies: No results for input(s): IRON, TIBC, TRANSFERRIN, FERRITIN in the last 72 hours. Studies/Results: Ct Abdomen Pelvis Wo Contrast  Result Date: 09/10/2019 CLINICAL DATA:  Concern for  UTI, dehydration, history of bladder cancer EXAM: CT ABDOMEN AND PELVIS WITHOUT CONTRAST TECHNIQUE: Multidetector CT imaging of the abdomen and pelvis was performed following the standard protocol without IV contrast. COMPARISON:  CT abdomen pelvis 08/12/2014, CTA chest 03/19/2019 FINDINGS: Lower chest: Basilar bronchiectatic changes and mucous plugging within the airways of the right left lower lobes. Some adjacent ground-glass opacity. Mild cardiomegaly. Coronary artery calcifications. Calcifications on the aortic leaflets. No pericardial effusion. Hepatobiliary: Punctate calcifications in the otherwise normal liver, likely sequela of granulomatous disease. Gallbladder distention with few partially calcified dependently layering gallstones. No visible intraductal gallstones. No biliary ductal dilatation. Pancreas: Fatty atrophy of the pancreas. No concerning pancreatic lesions or peripancreatic inflammation. No pancreatic ductal dilatation. Spleen: Normal in size without focal abnormality. Adrenals/Urinary Tract: Normal adrenal glands. Stable hyperdense cysts in the upper pole right kidney. Largest measuring 12 mm in size (2/39. Larger fluid attenuation cyst measuring up to 8.2 cm, and creased size from comparison exam large 7.3 cm exophytic lower pole right renal cyst is unchanged from prior.  Multiple smaller cysts are seen in both kidneys. Asymmetric atrophy of the left kidney is present. There is severe bilateral hydroureteronephrosis to the level of the severely distended bladder which demonstrates some asymmetric posterior bladder wall thickening. Additionally there is indentation of the bladder base by the enlarged prostate. Mild circumferential bladder wall thickening. Stomach/Bowel: Distal esophagus, stomach and duodenal sweep are unremarkable. No small bowel wall thickening or dilatation. Fecalization of the distal small bowel contents. A normal appendix is visualized. No colonic dilatation or wall thickening. Scattered colonic diverticula without focal pericolonic inflammation to suggest diverticulitis. Inspissated stool ball at the rectum with some pericolonic stranding and presacral stranding. Vascular/Lymphatic: Atherosclerotic plaque within the normal caliber aorta. Focal region of mid mesenteric hazy stranding with numerous reactive appearing clustered mid mesenteric lymph nodes compatible with mesenteritis. Few reactive nodes are present in the upper abdomen. Reproductive: Borderline prostatomegaly with few coarse calcifications. Other: Skin thickening superficial to the sacrum and coccyx with underlying subcutaneous gas tracking to the level of the bone with some associated lucent features. Presacral stranding seen in close vicinity. Circumferential body wall edema. No bowel containing hernias. Musculoskeletal: Lucent changes of the coccyx near region of presacral fat stranding. Multilevel degenerative changes are present in the imaged portions of the spine. Additional degenerative changes noted in the SI joints hips. IMPRESSION: 1. Severe bilateral hydroureteronephrosis to the level of the markedly distended bladder with asymmetric posterior bladder wall thickening concerning for an underlying malignancy on a background chronic outlet obstruction. 2. Skin thickening superficial to the  sacrum and coccyx with underlying subcutaneous gas tracking to the level of the bone with some associated lucent features, concerning for a sacral decubitus ulcer with features concerning for osteomyelitis. 3. Inspissated stool ball at the rectum. Perirectal and presacral stranding could reflect a stercoral colitis or be secondary to the decubitus process, as detailed above. 4. Fecalization of the distal small bowel contents, suggestive of delayed transit. 5. Focal region of mid mesenteric hazy stranding with numerous reactive appearing clustered mid mesenteric lymph nodes compatible with mesenteritis. 6. Basilar bronchiectasis with areas of mucous plugging and features suggestive for a more acute infectious or inflammatory process in the right lung base. 7. Aortic Atherosclerosis (ICD10-I70.0). These results will be called to the ordering clinician or representative by the Radiologist Assistant, and communication documented in the PACS or zVision Dashboard. Electronically Signed   By: Lovena Le M.D.   On: 09/10/2019 15:18   . aspirin  81 mg Oral Daily  . Chlorhexidine Gluconate Cloth  6 each Topical Daily  . Chlorhexidine Gluconate Cloth  6 each Topical Q0600  . ferrous sulfate  325 mg Oral BID WC  . finasteride  5 mg Oral Daily  . heparin  5,000 Units Subcutaneous Q8H  . mouth rinse  15 mL Mouth Rinse BID  . metoprolol tartrate  5 mg Intravenous Q8H  . mupirocin ointment  1 application Nasal BID  . potassium chloride  40 mEq Oral BID  . vitamin B-12  500 mcg Oral Daily    BMET    Component Value Date/Time   NA 152 (H) 09/12/2019 0301   K 2.8 (L) 09/12/2019 0301   CL 117 (H) 09/12/2019 0301   CO2 23 09/12/2019 0301   GLUCOSE 105 (H) 09/12/2019 0301   BUN 65 (H) 09/12/2019 0301   CREATININE 1.74 (H) 09/12/2019 0301   CALCIUM 8.6 (L) 09/12/2019 0301   GFRNONAA 32 (L) 09/12/2019 0301   GFRAA 38 (L) 09/12/2019 0301   CBC    Component Value Date/Time   WBC 7.8 09/11/2019 0326   RBC  3.34 (L) 09/11/2019 0326   HGB 10.1 (L) 09/11/2019 0326   HCT 34.3 (L) 09/11/2019 0326   PLT 222 09/11/2019 0326   MCV 102.7 (H) 09/11/2019 0326   MCH 30.2 09/11/2019 0326   MCHC 29.4 (L) 09/11/2019 0326   RDW 14.9 09/11/2019 0326   LYMPHSABS 0.7 03/18/2019 2234   MONOABS 0.7 03/18/2019 2234   EOSABS 0.0 03/18/2019 2234   BASOSABS 0.0 03/18/2019 2234   Assessment/Plan:  1. AKI/CKD- non-oliguric. Likely hemodynamically mediated ATN. Renal US without evidence of hydronephrosis. BUN/Cr rising with use of escalating lasix. Urine Na elevated. 1. CT Scan revealed severe bilateral hydronephrosis and markedly distended bladder with bladder wall irregularity concerning for possible malignancy 2. S/p foley catheter placement 09/10/19 with marked improvement of UOP but no significant change in BUN/Cr. 3. Recommend urology evaluation for bladder mass and obstruction 4. Will check renal US today to make sure that obstruction has been relieved with foley catheter placement. 2. Hypernatremia- not taking po.  Free water deficit is ~ 3.5 liters.  Will start 1/2 NS and follow and encourage po intake of water.  3. Metabolic acidosis- due to #1. Cont to follow 4. UTI- e coli and aerococcus- on rocephin 5. 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 Hypokalemia- replete and follow 7. Anemia  8. Anasarca/CHF/volume overload- diuresing after foley catheter placed.  Will hold lasix and follow.  1. ECHO revealed EF 45-50% with moderate LVH and grade II diastolic dysfunction 2. 3rd spacing from hypoalbuminemia and protein malnutrition.  9. Moderate protein and caloric malnutrition 10. Dementia  11. Disposition- pt is appropriate for DNR. Continue with conservative medical management.  Consider transition to comfort measures.  Donetta Potts, MD [employer] [phone]

## 2019-09-12 NOTE — Progress Notes (Signed)
PROGRESS NOTE    Zachary Obrien  [id number] DOB: [birth year] DOA: 09/05/2019 PCP: Josetta Huddle, MD    Brief Narrative:   85 year old gentleman with prior history of bladder cancers, BPH presented from SNF with dehydration and urinary tract infection his hospital course was complicated by hypernatremia urinary retention and also was found to have sacral osteomyelitis.  In view of poor functional status and clinical deterioration palliative care consulted for goals of care discussion.  Assessment & Plan:   Principal Problem:   Acute kidney injury ([place]) Active Problems:   AKI (acute kidney injury) ([place])   Hypernatremia   Pressure injury of skin     Acute kidney injury secondary to dehydration and poor oral intake Renal ultrasound unremarkable. CT of the abdomen and pelvis shows urinary retention and hydronephrosis, Foley catheter was placed on 09/10/2019. Nephrology consulted and recommended repeat imaging of the kidneys and palliative care consult and possible transition to comfort care.    History of combined systolic and diastolic heart failure Does not appear to be in decompensated failure at this time.  Hold Lasix for AKI continue with strict intake and output and daily weights.    Urinary tract infection Urine cultures growing E. coli and Aerococcus species Completed the course of Rocephin.  Osteomyelitis of the sacrum possibly secondary to sacral decubitus ulcer.  On further discussions with son treatment is on hold pending palliative care consult.    Dehydration/failure to thrive Decreased oral intake poor functional status. Palliative care consulted for goals of care discussion and possible transition to comfort if family desires.  Stage III sacral pressure ulcer present on admission Pressure Injury 09/06/19 Sacrum Medial;Upper Stage III -  Full thickness tissue loss. Subcutaneous fat may be visible but bone, tendon or muscle are NOT exposed. (Active)    09/06/19 0421  Location: Sacrum  Location Orientation: Medial;Upper  Staging: Stage III -  Full thickness tissue loss. Subcutaneous fat may be visible but bone, tendon or muscle are NOT exposed.  Wound Description (Comments):   Present on Admission: Yes     Hypernatremia A fluid deficit from either recent Lasix and poor oral intake Dextrose fluids ordered and repeat BMP in the morning.  Poor prognosis.    DVT prophylaxis: Heparin Code Status: DNR Family Communication: None at bedside Disposition Plan: Pending palliative care consult again [place] discussions   Consultants:   Palliative care  Nephrology  Procedures: Echocardiogram Antimicrobials: Rocephin  Subjective: Patient reports he is feeling weak and tired.  Does not have an appetite and does not want to eat anything at this time.  Has less than 45% of oral intake.  Objective: Vitals:   09/11/19 1318 09/11/19 2249 09/12/19 0550 09/12/19 0555  BP: 105/66 102/82 (!) 113/99   Pulse: 100 94 94   Resp: 20 15 15    Temp: 97.6 F (36.4 C) 98.3 F (36.8 C) 98.4 F (36.9 C)   TempSrc: Oral Oral Tympanic   SpO2: 99% 99% 97%   Weight:    68.9 kg    Intake/Output Summary (Last 24 hours) at 09/12/2019 1258 Last data filed at 09/12/2019 1011 Gross per 24 hour  Intake 150 ml  Output 1425 ml  Net -1275 ml   Filed Weights   09/10/19 0500 09/11/19 0712 09/12/19 0555  Weight: 75.8 kg 70 kg 68.9 kg    Examination:  General exam: Cachectic looking, ill appearing gentleman not in any kind of distress Respiratory system: Clear to auscultation. Respiratory effort normal. Cardiovascular system: S1 &  S2 heard, RRR. No JVD,  Gastrointestinal system: Abdomen is nondistended, soft and nontender. No organomegaly or masses felt. Normal bowel sounds heard. Central nervous system: Alert but confused Extremities: No cyanosis or clubbing leg edema present Skin: No rashes, lesions or ulcers Psychiatry: Mood appropriate    Data  Reviewed: I have personally reviewed following labs and imaging studies  CBC: Recent Labs  Lab 09/05/19 1710 09/06/19 0328 09/11/19 0326  WBC 9.8 8.4 7.8  HGB 9.9* 9.8* 10.1*  HCT 34.0* 33.6* 34.3*  MCV 103.7* 104.0* 102.7*  PLT 255 226 [id number]   Basic Metabolic Panel: Recent Labs  Lab 09/06/19 0328  09/09/19 0309 09/09/19 1140 09/10/19 0336 09/11/19 0326 09/12/19 0301  NA 153*   < > 149* 150* 147* 146* 152*  K 3.5   < > 3.6 3.3* 3.0* 3.5 2.8*  CL 125*   < > 120* 121* 118* 115* 117*  CO2 19*   < > 19* 19* 16* 20* 23  GLUCOSE 163*   < > 113* 162* 134* 138* 105*  BUN 65*   < > 66* 64* 67* 66* 65*  CREATININE 1.27*   < > 1.64* 1.69* 1.88* 1.89* 1.74*  CALCIUM 8.5*   < > 8.7* 8.7* 8.6* 8.9 8.6*  MG 2.0  --   --   --   --   --   --   PHOS 3.4  --   --   --   --  4.7* 4.2   < > = values in this interval not displayed.   GFR: Estimated Creatinine Clearance: 24.2 mL/min (A) (by C-G formula based on SCr of 1.74 mg/dL (H)). Liver Function Tests: Recent Labs  Lab 09/05/19 1710 09/10/19 0336 09/11/19 0326 09/12/19 0301  AST 22 25  --   --   ALT 31 39  --   --   ALKPHOS 70 84  --   --   BILITOT 0.6 0.3  --   --   PROT 7.5 7.4  --   --   ALBUMIN 2.8* 2.5* 2.6* 2.4*   No results for input(s): LIPASE, AMYLASE in the last 168 hours. No results for input(s): AMMONIA in the last 168 hours. Coagulation Profile: No results for input(s): INR, PROTIME in the last 168 hours. Cardiac Enzymes: No results for input(s): CKTOTAL, CKMB, CKMBINDEX, TROPONINI in the last 168 hours. BNP (last 3 results) No results for input(s): PROBNP in the last 8760 hours. HbA1C: No results for input(s): HGBA1C in the last 72 hours. CBG: No results for input(s): GLUCAP in the last 168 hours. Lipid Profile: No results for input(s): CHOL, HDL, LDLCALC, TRIG, CHOLHDL, LDLDIRECT in the last 72 hours. Thyroid Function Tests: No results for input(s): TSH, T4TOTAL, FREET4, T3FREE, THYROIDAB in the last 72  hours. Anemia Panel: No results for input(s): VITAMINB12, FOLATE, FERRITIN, TIBC, IRON, RETICCTPCT in the last 72 hours. Sepsis Labs: Recent Labs  Lab 09/05/19 1711  LATICACIDVEN 1.5    Recent Results (from the past 240 hour(s))  SARS CORONAVIRUS 2 (TAT 6-24 HRS) Nasopharyngeal Nasopharyngeal Swab     Status: None   Collection Time: 09/05/19  4:37 PM   Specimen: Nasopharyngeal Swab  Result Value Ref Range Status   SARS Coronavirus 2 NEGATIVE NEGATIVE Final    Comment: (NOTE) SARS-CoV-2 target nucleic acids are NOT DETECTED. The SARS-CoV-2 RNA is generally detectable in upper and lower respiratory specimens during the acute phase of infection. Negative results do not preclude SARS-CoV-2 infection, do not rule out co-infections  with other pathogens, and should not be used as the sole basis for treatment or other patient management decisions. Negative results must be combined with clinical observations, patient history, and epidemiological information. The expected result is Negative. Fact Sheet for Patients: SugarRoll.be Fact Sheet for Healthcare Providers: https://www.woods-mathews.com/ This test is not yet approved or cleared by the [place] FDA and  has been authorized for detection and/or diagnosis of SARS-CoV-2 by FDA under an Emergency Use Authorization (EUA). This EUA will remain  in effect (meaning this test can be used) for the duration of the COVID-19 declaration under Section 56 4(b)(1) of the Act, 21 U.S.C. section 360bbb-3(b)(1), unless the authorization is terminated or revoked sooner. Performed at [facility], [place] [address] [place], [place] [address]   Culture, blood (single) w Reflex to ID Panel     Status: None   Collection Time: 09/05/19  5:09 PM   Specimen: BLOOD  Result Value Ref Range Status   Specimen Description   Final    BLOOD RIGHT ARM Performed at [facility]  [address], [place], [place] [address]    Special Requests   Final    BOTTLES DRAWN AEROBIC AND ANAEROBIC Blood Culture adequate volume Performed at [facility] [address], [place], [place] [address]    Culture   Final    NO GROWTH 5 DAYS Performed at [facility], [place] [address], [place], [place] [address]    Report Status 09/10/2019 FINAL  Final  Culture, Urine     Status: Abnormal   Collection Time: 09/05/19  6:00 PM   Specimen: Urine, Clean Catch  Result Value Ref Range Status   Specimen Description   Final    URINE, CLEAN CATCH Performed at [facility], [place] [address], [place], [place] [address]    Special Requests   Final    NONE Performed at [facility], [place] [address], [place], [place] [address]    Culture (A)  Final    >=100,000 COLONIES/mL ESCHERICHIA COLI >=100,000 COLONIES/mL AEROCOCCUS SPECIES Standardized susceptibility testing for this organism is not available. Performed at [facility], [place] [address] [place],  [address]    Report Status 09/08/2019 FINAL  Final   Organism ID, Bacteria ESCHERICHIA COLI (A)  Final      Susceptibility   Escherichia coli - MIC*    AMPICILLIN 8 SENSITIVE Sensitive     CEFAZOLIN <=4 SENSITIVE Sensitive     CEFTRIAXONE <=1 SENSITIVE Sensitive     CIPROFLOXACIN <=0.25 SENSITIVE Sensitive     GENTAMICIN <=1 SENSITIVE Sensitive     IMIPENEM <=0.25 SENSITIVE Sensitive     NITROFURANTOIN <=16 SENSITIVE Sensitive     TRIMETH/SULFA >=320 RESISTANT Resistant     AMPICILLIN/SULBACTAM <=2 SENSITIVE Sensitive     PIP/TAZO <=4 SENSITIVE Sensitive     Extended ESBL NEGATIVE Sensitive     * >=100,000 COLONIES/mL ESCHERICHIA COLI  MRSA PCR Screening     Status: Abnormal   Collection Time: 09/09/19  3:17 PM   Specimen: Nasal Mucosa; Nasopharyngeal  Result Value Ref Range Status   MRSA by PCR POSITIVE (A) NEGATIVE Final    Comment:        The GeneXpert MRSA  Assay (FDA approved for NASAL specimens only), is one component of a comprehensive MRSA colonization surveillance program. It is not intended to diagnose MRSA infection nor to guide or monitor treatment for MRSA infections. RESULT CALLED TO,  READ BACK BY AND VERIFIED WITH: T.THURMAN,RN [id number] @1158  BY V.WILKINS Performed at [facility] [address] [place], [place] [address]          Radiology Studies: Ct Abdomen Pelvis Wo Contrast  Result Date: 09/10/2019 CLINICAL DATA:  Concern for UTI, dehydration, history of bladder cancer EXAM: CT ABDOMEN AND PELVIS WITHOUT CONTRAST TECHNIQUE: Multidetector CT imaging of the abdomen and pelvis was performed following the standard protocol without IV contrast. COMPARISON:  CT abdomen pelvis 08/12/2014, CTA chest 03/19/2019 FINDINGS: Lower chest: Basilar bronchiectatic changes and mucous plugging within the airways of the right left lower lobes. Some adjacent ground-glass opacity. Mild cardiomegaly. Coronary artery calcifications. Calcifications on the aortic leaflets. No pericardial effusion. Hepatobiliary: Punctate calcifications in the otherwise normal liver, likely sequela of granulomatous disease. Gallbladder distention with few partially calcified dependently layering gallstones. No visible intraductal gallstones. No biliary ductal dilatation. Pancreas: Fatty atrophy of the pancreas. No concerning pancreatic lesions or peripancreatic inflammation. No pancreatic ductal dilatation. Spleen: Normal in size without focal abnormality. Adrenals/Urinary Tract: Normal adrenal glands. Stable hyperdense cysts in the upper pole right kidney. Largest measuring 12 mm in size (2/39. Larger fluid attenuation cyst measuring up to 8.2 cm, and creased size from comparison exam large 7.3 cm exophytic lower pole right renal cyst is unchanged from prior. Multiple smaller cysts are seen in both kidneys. Asymmetric atrophy of the left kidney is  present. There is severe bilateral hydroureteronephrosis to the level of the severely distended bladder which demonstrates some asymmetric posterior bladder wall thickening. Additionally there is indentation of the bladder base by the enlarged prostate. Mild circumferential bladder wall thickening. Stomach/Bowel: Distal esophagus, stomach and duodenal sweep are unremarkable. No small bowel wall thickening or dilatation. Fecalization of the distal small bowel contents. A normal appendix is visualized. No colonic dilatation or wall thickening. Scattered colonic diverticula without focal pericolonic inflammation to suggest diverticulitis. Inspissated stool ball at the rectum with some pericolonic stranding and presacral stranding. Vascular/Lymphatic: Atherosclerotic plaque within the normal caliber aorta. Focal region of mid mesenteric hazy stranding with numerous reactive appearing clustered mid mesenteric lymph nodes compatible with mesenteritis. Few reactive nodes are present in the upper abdomen. Reproductive: Borderline prostatomegaly with few coarse calcifications. Other: Skin thickening superficial to the sacrum and coccyx with underlying subcutaneous gas tracking to the level of the bone with some associated lucent features. Presacral stranding seen in close vicinity. Circumferential body wall edema. No bowel containing hernias. Musculoskeletal: Lucent changes of the coccyx near region of presacral fat stranding. Multilevel degenerative changes are present in the imaged portions of the spine. Additional degenerative changes noted in the SI joints hips. IMPRESSION: 1. Severe bilateral hydroureteronephrosis to the level of the markedly distended bladder with asymmetric posterior bladder wall thickening concerning for an underlying malignancy on a background chronic outlet obstruction. 2. Skin thickening superficial to the sacrum and coccyx with underlying subcutaneous gas tracking to the level of the bone with  some associated lucent features, concerning for a sacral decubitus ulcer with features concerning for osteomyelitis. 3. Inspissated stool ball at the rectum. Perirectal and presacral stranding could reflect a stercoral colitis or be secondary to the decubitus process, as detailed above. 4. Fecalization of the distal small bowel contents, suggestive of delayed transit. 5. Focal region of mid mesenteric hazy stranding with numerous reactive appearing clustered mid mesenteric lymph nodes compatible with mesenteritis. 6. Basilar bronchiectasis with areas of mucous plugging and features suggestive for a more acute infectious or inflammatory process in the  right lung base. 7. Aortic Atherosclerosis (ICD10-I70.0). These results will be called to the ordering clinician or representative by the Radiologist Assistant, and communication documented in the PACS or zVision Dashboard. Electronically Signed   By: Lovena Le M.D.   On: 09/10/2019 15:18        Scheduled Meds:  aspirin  81 mg Oral Daily   Chlorhexidine Gluconate Cloth  6 each Topical Daily   Chlorhexidine Gluconate Cloth  6 each Topical Q0600   ferrous sulfate  325 mg Oral BID WC   finasteride  5 mg Oral Daily   heparin  5,000 Units Subcutaneous Q8H   mouth rinse  15 mL Mouth Rinse BID   metoprolol tartrate  5 mg Intravenous Q8H   mupirocin ointment  1 application Nasal BID   potassium chloride  40 mEq Oral BID   vitamin B-12  500 mcg Oral Daily   Continuous Infusions:  sodium chloride       LOS: 6 days        Hosie Poisson, MD [facility] Pager [phone]  If 7PM-7AM, please contact night-coverage [email or web address] Password [facility] 09/12/2019, 12:58 PM

## 2019-09-12 NOTE — Progress Notes (Addendum)
PALLIATIVE NOTE:  Referral received for goals of care. I assessed patient at the bedside. No family present. I called and spoke with patient's son, Zachary Obrien.   I introduced myself and Palliative Medicine's role in his father care. Gwyndolyn Saxon verbalized understanding and appreciation. He is requesting to have his brother involved and would like to meet face to face tomorrow for [place]. Gwyndolyn Saxon is going to discuss with his brother and plans to give me a call back with the selected meeting time for tomorrow. He states he knows it will be first thing in the morning as he has several appointments tomorrow. Verbalized understanding.   I will await to hear back from [place] with specified time. Both he and his brother will be approved to visit at the bedside in order to make decisions and engage in [place] discussion.   Detailed notes and recommendations to follow family meeting on 09/13/2019.  2150: Received notice from sons they would like to meet tomorrow 09/13/19 at 0900 for [place] discussion. Gwyndolyn Saxon and Richardson Landry are aware this will be allowed as an exception given the importance of goals of care discussion for Mr. Alizadeh. Sons acknowledge and understand I will meet them at their father's bedside.   Thank you for your referral and allowing Palliative Medicine to assist in patient's care.   Alda Lea, AGPCNP-BC Palliative Medicine Team  Phone: [phone] Fax: [phone] Pager: [phone] Amion: N. [place]   NO CHARGE

## 2019-09-13 DIAGNOSIS — Z515 Encounter for palliative care: Secondary | ICD-10-CM

## 2019-09-13 DIAGNOSIS — Z66 Do not resuscitate: Secondary | ICD-10-CM

## 2019-09-13 DIAGNOSIS — Z7189 Other specified counseling: Secondary | ICD-10-CM

## 2019-09-13 LAB — RENAL FUNCTION PANEL
Albumin: 2.3 g/dL — ABNORMAL LOW (ref 3.5–5.0)
Anion gap: 11 (ref 5–15)
BUN: 57 mg/dL — ABNORMAL HIGH (ref 8–23)
CO2: 22 mmol/L (ref 22–32)
Calcium: 8.4 mg/dL — ABNORMAL LOW (ref 8.9–10.3)
Chloride: 119 mmol/L — ABNORMAL HIGH (ref 98–111)
Creatinine, Ser: 1.57 mg/dL — ABNORMAL HIGH (ref 0.61–1.24)
GFR calc Af Amer: 42 mL/min — ABNORMAL LOW (ref >60)
GFR calc non Af Amer: 37 mL/min — ABNORMAL LOW (ref >60)
Glucose, Bld: 147 mg/dL — ABNORMAL HIGH (ref 70–99)
Phosphorus: 3.1 mg/dL (ref 2.5–4.6)
Potassium: 3.1 mmol/L — ABNORMAL LOW (ref 3.5–5.1)
Sodium: 152 mmol/L — ABNORMAL HIGH (ref 135–145)

## 2019-09-13 MED ORDER — DEXTROSE 5 % IV SOLN
INTRAVENOUS | Status: DC
  Administered 2019-09-13 (×2): via INTRAVENOUS

## 2019-09-13 MED ORDER — POTASSIUM CHLORIDE CRYS ER 20 MEQ PO TBCR
40.0000 meq | EXTENDED_RELEASE_TABLET | Freq: Two times a day (BID) | ORAL | Status: DC
  Administered 2019-09-13 – 2019-09-14 (×3): 40 meq via ORAL
  Filled 2019-09-13 (×2): qty 2

## 2019-09-13 NOTE — Care Management Important Message (Signed)
Important Message  Patient Details IM Letter given to Velva Harman RN to present to the Patient                                                Name: Zachary Obrien MRN: [id number] Date of Birth: 1920-11-15   Medicare Important Message Given:  Yes     Kerin Salen 09/13/2019, 11:09 AM

## 2019-09-13 NOTE — Progress Notes (Signed)
Patient ID: GAD EMGE, male   DOB: June 16, 1921, 85 y.o.   MRN: [id number] S:No events overnight O:BP 106/66 (BP Location: Left Arm)   Pulse 96   Temp 97.9 F (36.6 C) (Oral)   Resp 14   Wt 68.9 kg   SpO2 100%   BMI 21.79 kg/m   Intake/Output Summary (Last 24 hours) at 09/13/2019 0821 Last data filed at 09/13/2019 0600 Gross per 24 hour  Intake 400.07 ml  Output 1525 ml  Net -1124.93 ml   Intake/Output: I/O last 3 completed shifts: In: 430.1 [P.O.:180; I.V.:250.1] Out: 2175 [Urine:2175]  Intake/Output this shift:  No intake/output data recorded. Weight change:  Gen: frail, cachectic WM in NAD CVS: no rub Resp: cta Abd: +BS, soft, NT Ext: trace pretibial edema  Recent Labs  Lab 09/08/19 1700 09/09/19 0309 09/09/19 1140 09/10/19 0336 09/11/19 0326 09/12/19 0301 09/13/19 0320  NA 150* 149* 150* 147* 146* 152* 152*  K 3.6 3.6 3.3* 3.0* 3.5 2.8* 3.1*  CL 121* 120* 121* 118* 115* 117* 119*  CO2 19* 19* 19* 16* 20* 23 22  GLUCOSE 100* 113* 162* 134* 138* 105* 147*  BUN 65* 66* 64* 67* 66* 65* 57*  CREATININE 1.63* 1.64* 1.69* 1.88* 1.89* 1.74* 1.57*  ALBUMIN  --   --   --  2.5* 2.6* 2.4* 2.3*  CALCIUM 8.7* 8.7* 8.7* 8.6* 8.9 8.6* 8.4*  PHOS  --   --   --   --  4.7* 4.2 3.1  AST  --   --   --  25  --   --   --   ALT  --   --   --  39  --   --   --    Liver Function Tests: Recent Labs  Lab 09/10/19 0336 09/11/19 0326 09/12/19 0301 09/13/19 0320  AST 25  --   --   --   ALT 39  --   --   --   ALKPHOS 84  --   --   --   BILITOT 0.3  --   --   --   PROT 7.4  --   --   --   ALBUMIN 2.5* 2.6* 2.4* 2.3*   No results for input(s): LIPASE, AMYLASE in the last 168 hours. No results for input(s): AMMONIA in the last 168 hours. CBC: Recent Labs  Lab 09/11/19 0326  WBC 7.8  HGB 10.1*  HCT 34.3*  MCV 102.7*  PLT 222   Cardiac Enzymes: No results for input(s): CKTOTAL, CKMB, CKMBINDEX, TROPONINI in the last 168 hours. CBG: No results for input(s): GLUCAP in  the last 168 hours.  Iron Studies: No results for input(s): IRON, TIBC, TRANSFERRIN, FERRITIN in the last 72 hours. Studies/Results: No results found. Marland Kitchen aspirin  81 mg Oral Daily  . Chlorhexidine Gluconate Cloth  6 each Topical Daily  . Chlorhexidine Gluconate Cloth  6 each Topical Q0600  . ferrous sulfate  325 mg Oral BID WC  . finasteride  5 mg Oral Daily  . heparin  5,000 Units Subcutaneous Q8H  . mouth rinse  15 mL Mouth Rinse BID  . metoprolol tartrate  5 mg Intravenous Q8H  . mupirocin ointment  1 application Nasal BID  . potassium chloride  40 mEq Oral BID  . vitamin B-12  500 mcg Oral Daily    BMET    Component Value Date/Time   NA 152 (H) 09/13/2019 0320   K 3.1 (L) 09/13/2019 0320  CL 119 (H) 09/13/2019 0320   CO2 22 09/13/2019 0320   GLUCOSE 147 (H) 09/13/2019 0320   BUN 57 (H) 09/13/2019 0320   CREATININE 1.57 (H) 09/13/2019 0320   CALCIUM 8.4 (L) 09/13/2019 0320   GFRNONAA 37 (L) 09/13/2019 0320   GFRAA 42 (L) 09/13/2019 0320   CBC    Component Value Date/Time   WBC 7.8 09/11/2019 0326   RBC 3.34 (L) 09/11/2019 0326   HGB 10.1 (L) 09/11/2019 0326   HCT 34.3 (L) 09/11/2019 0326   PLT 222 09/11/2019 0326   MCV 102.7 (H) 09/11/2019 0326   MCH 30.2 09/11/2019 0326   MCHC 29.4 (L) 09/11/2019 0326   RDW 14.9 09/11/2019 0326   LYMPHSABS 0.7 03/18/2019 2234   MONOABS 0.7 03/18/2019 2234   EOSABS 0.0 03/18/2019 2234   BASOSABS 0.0 03/18/2019 2234    Assessment/Plan:  1. AKI/CKD- non-oliguric. Likely hemodynamically mediated ATN. Renal US without evidence of hydronephrosis. BUN/Cr rising with use of escalating lasix. Urine Na elevated. 1. CT Scan revealed severe bilateral hydronephrosis and markedly distended bladder with bladder wall irregularity concerning for possible malignancy 2. S/p foley catheter placement 09/10/19 with marked improvement of UOP but no significant change in BUN/Cr. 3. Recommend urology evaluation for bladder mass and obstruction  unless family decides to proceed with palliative care 4. BUN/Cr improving after foley catheter placement.   5. Nothing further to add and will sign off. Please call with questions or concerns. 2. Hypernatremia- not taking po.  Free water deficit remains ~ 3.5 liters.   1. Recommend changing IVF's to D5W with 40 mEq KCL and follow.  Would also increas the rate of fluids to 100 mlhr.  3. Metabolic acidosis- due to #1. resolved 4. UTI- e coli and aerococcus- on rocephin 5. 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 Hypokalemia- replete and follow 7. Anemia  8. Anasarca/CHF/volume overload- diuresingafter foley catheter placed. Will hold lasix and follow.  1. ECHO revealed EF 45-50% with moderate LVH and grade II diastolic dysfunction 2. 3rd spacing from hypoalbuminemia and protein malnutrition.  9. Moderate protein and caloric malnutrition 10. Dementia  11. Disposition- pt is appropriate for DNR. Continue with conservative medical management.  Consider transition to comfort measures and agree with palliative care consult as he is not able to feed himself  Donetta Potts, MD [facility] [phone]

## 2019-09-13 NOTE — Progress Notes (Signed)
PROGRESS NOTE    Zachary Obrien  [id number] DOB: [birth year] DOA: 09/05/2019 PCP: Josetta Huddle, MD    Brief Narrative:   85 year old gentleman with prior history of bladder cancers, BPH presented from SNF with dehydration and urinary tract infection his hospital course was complicated by hypernatremia urinary retention and also was found to have sacral osteomyelitis.  In view of poor functional status and clinical deterioration palliative care consulted for goals of care discussion.  Assessment & Plan:   Principal Problem:   Acute kidney injury ([place]) Active Problems:   AKI (acute kidney injury) ([place])   Hypernatremia   Pressure injury of skin     Acute kidney injury secondary to dehydration and poor oral intake Renal ultrasound unremarkable. CT of the abdomen and pelvis shows urinary retention and hydronephrosis, Foley catheter was placed on 09/10/2019. Nephrology consulted and recommendations given.  In view of his poor functional status and clinical deterioration despite fluids , palliative care consulted , who had [place] discussions with the family and transfer the patient to residential hospice at [facility] when bed is available.     History of combined systolic and diastolic heart failure He appears to be dehydrated at this time continue to hold Lasix for AKI. Transition to comfort care.    Urinary tract infection Urine cultures growing E. coli and Aerococcus species Completed the course of Rocephin.  Osteomyelitis of the sacrum possibly secondary to sacral decubitus ulcer.  Patient's son would like to avoid all interventions at this time and transition to residential hospice when a bed is available.    Hypokalemia: replaced.   Dehydration/failure to thrive Decreased oral intake and poor poor functional status. Palliative care consulted for goals of care discussion and possible transition to comfort with comfort as the goal.  Stage III sacral pressure ulcer  present on admission Pressure Injury 09/06/19 Sacrum Medial;Upper Stage III -  Full thickness tissue loss. Subcutaneous fat may be visible but bone, tendon or muscle are NOT exposed. (Active)  09/06/19 0421  Location: Sacrum  Location Orientation: Medial;Upper  Staging: Stage III -  Full thickness tissue loss. Subcutaneous fat may be visible but bone, tendon or muscle are NOT exposed.  Wound Description (Comments):   Present on Admission: Yes     Hypernatremia Free water deficit.  Change the fluids to dextrose fluids at 100 mL/h  Poor prognosis.    DVT prophylaxis: Heparin Code Status: DNR Family Communication: Discussed with son over the phone Disposition Plan: [facility] placement when bed is available.   Consultants:   Palliative care  Nephrology  Procedures: Echocardiogram Antimicrobials: None  Subjective: Patient reports feeling weak and tired.  No new complaints  Objective: Vitals:   09/12/19 0555 09/12/19 1359 09/12/19 2209 09/13/19 0544  BP:  (!) 101/55 121/64 106/66  Pulse:  94 98 96  Resp:   15 14  Temp:  97.7 F (36.5 C) 98.5 F (36.9 C) 97.9 F (36.6 C)  TempSrc:  Oral Oral Oral  SpO2:  100% 100% 100%  Weight: 68.9 kg       Intake/Output Summary (Last 24 hours) at 09/13/2019 1352 Last data filed at 09/13/2019 1000 Gross per 24 hour  Intake 400.07 ml  Output 1250 ml  Net -849.93 ml   Filed Weights   09/10/19 0500 09/11/19 0712 09/12/19 0555  Weight: 75.8 kg 70 kg 68.9 kg    Examination:  General exam: Cachectic, ill-appearing gentleman not in any kind of distress.  Respiratory system: Clear to auscultation Cardiovascular system:  S1-S2 heard, regular rate rhythm. Gastrointestinal system: Abdomen is soft, nontender, nondistended, bowel sounds normal Central nervous system: Alert and confused Extremities: Leg edema present Skin: pressure ulcer present.  Psychiatry: mood is appropriate.     Data Reviewed: I have personally reviewed following  labs and imaging studies  CBC: Recent Labs  Lab 09/11/19 0326  WBC 7.8  HGB 10.1*  HCT 34.3*  MCV 102.7*  PLT [id number]   Basic Metabolic Panel: Recent Labs  Lab 09/09/19 1140 09/10/19 0336 09/11/19 0326 09/12/19 0301 09/13/19 0320  NA 150* 147* 146* 152* 152*  K 3.3* 3.0* 3.5 2.8* 3.1*  CL 121* 118* 115* 117* 119*  CO2 19* 16* 20* 23 22  GLUCOSE 162* 134* 138* 105* 147*  BUN 64* 67* 66* 65* 57*  CREATININE 1.69* 1.88* 1.89* 1.74* 1.57*  CALCIUM 8.7* 8.6* 8.9 8.6* 8.4*  PHOS  --   --  4.7* 4.2 3.1   GFR: Estimated Creatinine Clearance: 26.8 mL/min (A) (by C-G formula based on SCr of 1.57 mg/dL (H)). Liver Function Tests: Recent Labs  Lab 09/10/19 0336 09/11/19 0326 09/12/19 0301 09/13/19 0320  AST 25  --   --   --   ALT 39  --   --   --   ALKPHOS 84  --   --   --   BILITOT 0.3  --   --   --   PROT 7.4  --   --   --   ALBUMIN 2.5* 2.6* 2.4* 2.3*   No results for input(s): LIPASE, AMYLASE in the last 168 hours. No results for input(s): AMMONIA in the last 168 hours. Coagulation Profile: No results for input(s): INR, PROTIME in the last 168 hours. Cardiac Enzymes: No results for input(s): CKTOTAL, CKMB, CKMBINDEX, TROPONINI in the last 168 hours. BNP (last 3 results) No results for input(s): PROBNP in the last 8760 hours. HbA1C: No results for input(s): HGBA1C in the last 72 hours. CBG: No results for input(s): GLUCAP in the last 168 hours. Lipid Profile: No results for input(s): CHOL, HDL, LDLCALC, TRIG, CHOLHDL, LDLDIRECT in the last 72 hours. Thyroid Function Tests: No results for input(s): TSH, T4TOTAL, FREET4, T3FREE, THYROIDAB in the last 72 hours. Anemia Panel: No results for input(s): VITAMINB12, FOLATE, FERRITIN, TIBC, IRON, RETICCTPCT in the last 72 hours. Sepsis Labs: No results for input(s): PROCALCITON, LATICACIDVEN in the last 168 hours.  Recent Results (from the past 240 hour(s))  SARS CORONAVIRUS 2 (TAT 6-24 HRS) Nasopharyngeal Nasopharyngeal  Swab     Status: None   Collection Time: 09/05/19  4:37 PM   Specimen: Nasopharyngeal Swab  Result Value Ref Range Status   SARS Coronavirus 2 NEGATIVE NEGATIVE Final    Comment: (NOTE) SARS-CoV-2 target nucleic acids are NOT DETECTED. The SARS-CoV-2 RNA is generally detectable in upper and lower respiratory specimens during the acute phase of infection. Negative results do not preclude SARS-CoV-2 infection, do not rule out co-infections with other pathogens, and should not be used as the sole basis for treatment or other patient management decisions. Negative results must be combined with clinical observations, patient history, and epidemiological information. The expected result is Negative. Fact Sheet for Patients: SugarRoll.be Fact Sheet for Healthcare Providers: https://www.woods-mathews.com/ This test is not yet approved or cleared by the [place] FDA and  has been authorized for detection and/or diagnosis of SARS-CoV-2 by FDA under an Emergency Use Authorization (EUA). This EUA will remain  in effect (meaning this test can be used) for the duration  of the COVID-19 declaration under Section 56 4(b)(1) of the Act, 21 U.S.C. section 360bbb-3(b)(1), unless the authorization is terminated or revoked sooner. Performed at [facility], [place] [address] [place], [place] [address]   Culture, blood (single) w Reflex to ID Panel     Status: None   Collection Time: 09/05/19  5:09 PM   Specimen: BLOOD  Result Value Ref Range Status   Specimen Description   Final    BLOOD RIGHT ARM Performed at [facility] [address], [place], [place] [address]    Special Requests   Final    BOTTLES DRAWN AEROBIC AND ANAEROBIC Blood Culture adequate volume Performed at [facility] [address], [place], [place] [address]    Culture   Final    NO GROWTH 5 DAYS Performed at [facility],  [place] [address] [place], [place] [address]    Report Status 09/10/2019 FINAL  Final  Culture, Urine     Status: Abnormal   Collection Time: 09/05/19  6:00 PM   Specimen: Urine, Clean Catch  Result Value Ref Range Status   Specimen Description   Final    URINE, CLEAN CATCH Performed at [facility], [place] [address], [place], [place] [address]    Special Requests   Final    NONE Performed at [facility], [place] [address] [place], [place] [address]    Culture (A)  Final    >=100,000 COLONIES/mL ESCHERICHIA COLI >=100,000 COLONIES/mL AEROCOCCUS SPECIES Standardized susceptibility testing for this organism is not available. Performed at [facility], [place] [address], [place], [place] [address]    Report Status 09/08/2019 FINAL  Final   Organism ID, Bacteria ESCHERICHIA COLI (A)  Final      Susceptibility   Escherichia coli - MIC*    AMPICILLIN 8 SENSITIVE Sensitive     CEFAZOLIN <=4 SENSITIVE Sensitive     CEFTRIAXONE <=1 SENSITIVE Sensitive     CIPROFLOXACIN <=0.25 SENSITIVE Sensitive     GENTAMICIN <=1 SENSITIVE Sensitive     IMIPENEM <=0.25 SENSITIVE Sensitive     NITROFURANTOIN <=16 SENSITIVE Sensitive     TRIMETH/SULFA >=320 RESISTANT Resistant     AMPICILLIN/SULBACTAM <=2 SENSITIVE Sensitive     PIP/TAZO <=4 SENSITIVE Sensitive     Extended ESBL NEGATIVE Sensitive     * >=100,000 COLONIES/mL ESCHERICHIA COLI  MRSA PCR Screening     Status: Abnormal   Collection Time: 09/09/19  3:17 PM   Specimen: Nasal Mucosa; Nasopharyngeal  Result Value Ref Range Status   MRSA by PCR POSITIVE (A) NEGATIVE Final    Comment:        The GeneXpert MRSA Assay (FDA approved for NASAL specimens only), is one component of a comprehensive MRSA colonization surveillance program. It is not intended to diagnose MRSA infection nor to guide or monitor treatment for MRSA infections. RESULT CALLED TO, READ BACK BY AND VERIFIED WITH: T.THURMAN,RN [id number]  @1158  BY V.WILKINS Performed at [facility] [address] [place], [place] [address]          Radiology Studies: No results found.      Scheduled Meds: . aspirin  81 mg Oral Daily  . Chlorhexidine Gluconate Cloth  6 each Topical Daily  . Chlorhexidine Gluconate Cloth  6 each Topical Q0600  . ferrous sulfate  325 mg Oral BID WC  . finasteride  5 mg Oral Daily  . heparin  5,000 Units  Subcutaneous Q8H  . mouth rinse  15 mL Mouth Rinse BID  . metoprolol tartrate  5 mg Intravenous Q8H  . mupirocin ointment  1 application Nasal BID  . potassium chloride  40 mEq Oral BID  . vitamin B-12  500 mcg Oral Daily   Continuous Infusions: . dextrose 100 mL/hr at 09/13/19 1239     LOS: 7 days        Hosie Poisson, MD [facility] Pager [phone]  If 7PM-7AM, please contact night-coverage [email or web address] Password [email or web address] 09/13/2019, 1:52 PM

## 2019-09-13 NOTE — Consult Note (Addendum)
Consultation Note Date: 09/13/2019   Patient Name: Zachary Obrien  DOB: 05-Aug-1921  MRN: [id number]  Age / Sex: 85 y.o., male   PCP: Zachary Huddle, MD Referring Physician: Hosie Poisson, MD   REASON FOR CONSULTATION:Establishing goals of care  Palliative Care consult requested for this 85 y.o. male with multiple medical problems including BPH, bladder cancer, dementia, and cornea transplant. He presented from [facility] facility with concerns of possible episode of choking on food and abnormal labs. He was admitted in May 2020 after falling and was noted to be COVID positive which he was asymptomatic for. At that time he was transferred to [facility] from his ALF. Work-up showed AKI BUN 74, Cr 1.52, UA positive for UTI, blood cultures negative, chest x-ray showed low lung volumes without focal consolidation or  Pleural effusion. He was found to have sacral osteomyelitis.  Since admission patient remains somewhat somnolent, poor po intake, and confused. Palliative Medicine consulted for goals of care.    Clinical Assessment and Goals of Care: I have reviewed medical records including lab results, imaging, Epic notes, and MAR, received report from the bedside RN, and assessed the patient. I met at the bedside with patient, and his two sons Zachary Obrien and Zachary Obrien) to discuss diagnosis prognosis, [place], EOL wishes, disposition and options.   Mr. Zachary Obrien is awake and alert. He will follow some commands. Does not speak much but will respond yes, no, and thank you! Unable to assess mentation or if he recognizes his sons. He denies pain but is mainly saying "No" to all asked questions. I assisted him with feeding of his breakfast tray while at the bedside. Educated sons on restrictions set by SLP. He took in several small bites and drank some of his juice. No choking observed however, continuous prompting required for him to chew or swallow. He refused after 5-10 bites.   I introduced  Palliative Medicine as specialized medical care for people living with serious illness. It focuses on providing relief from the symptoms and stress of a serious illness. The goal is to improve quality of life for both the patient and the family. Family verbalized appreciation of our involvement and support.   We discussed a brief life review of the patient, along with Zachary Obrien functional and nutritional status. Sons report their mother, patients wife for more than 60 years passed away over 10 years ago due to heart failure. They had 3 sons, 1 whom lives in [place]. 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 He is Nurse, learning disability.   Prior to admission he was a long-term resident of [facility] after living in an Assisted Living facility for many years. Sons report after worsening dementia and falls he was no longer able to stay alone. He has been to several facilities for rehab. Sons report they have not been able to see him due to COVID and feel that he has declined greatly since then. His mental status has worsened, he has become bedridden, and his appetite has declined. Prior to [place] he was up and ambulatory going independently to the dining halls for 3 meals, snacks, and playing Bingo.   We discussed His current illness and what it means in the larger context of His on-going co-morbidities. With specific discussions regarding progression of dementia, UTI, dehydration, AKI, and his overall functional and nutritional decline. Natural disease trajectory and expectations at EOL  were discussed.  Both sons verbalized understanding and appreciation of updates. They expressed their initial hopes that he would improve and could return to [facility] however after seeing him and his current state they are concerned that he is now approaching EOL. Sons expressed they do not want him to suffer sharing quality of  life is what is most important to them and to their father based on previous wishes and discussions before his decline. Support given.   I attempted to elicit values and goals of care important to the patient.    The difference between aggressive medical intervention and comfort care was considered in light of the patient's goals of care. I educated patient/family on what comfort care measures would look like. Sons mutually share they are not interested in recurrent hospitalizations making note of possible cycle of dehydration, UTIs, and further decline. They again expressed importance of quality of life sharing their goal is for him to be comfortable in whatever state he is going to be in! Support given.   Advanced directives, concepts specific to code status, artifical feeding and hydration, and rehospitalization were considered and discussed. Family does not wish for artificial feedings such as PEG or NG tube. They confirm wishes for DNR/DNI. Patient does have a documented advanced directive and both sons are his 38.   Given family's expressed wishes and making it known what their father's wishes were Hospice services outpatient were explained and offered. Family verbalized their understanding and awareness of hospice's goals and philosophy of care. Sons asked appropriate questions and requested to discuss further residential hospice facilities. All questions answered. Sons mutually expressed their wishes for patient to be considered for hospice home versus returning to SNF for hospice. They are familiar with [facility] and feels they would be more comfortable with him being in that setting of care for EOL versus SNF.   While meeting with patient and sons, Patient's Zachary Obrien from his [place] arrived. He spoke with sons and patient offering support during Zachary Obrien illness. I offered to allow them time alone, however they requested I remain at the bedside for support. Priest at the bedside  and praying over patient and sons. He offered salvation prayer to patient and Sacrament of the Sick/Rites. Sons tearful expressing their appreciation and peace with their father passing at any point sharing he was heavily involved in the church an attended service regularly up until Jan of this year. Therapeutic listening and support offered.   Questions and concerns were addressed.  The family was encouraged to call with questions or concerns.  PMT will continue to support holistically.   SOCIAL HISTORY:     reports that he quit smoking about 53 years ago. His smoking use included cigarettes. He has a 40.00 pack-year smoking history. He has never used smokeless tobacco. He reports current alcohol use of about 2.0 standard drinks of alcohol per week. He reports that he does not use drugs.  CODE STATUS: DNR  ADVANCE DIRECTIVES: NEXT of KIN (Sons)   SYMPTOM MANAGEMENT: per attending   Palliative Prophylaxis:   Aspiration, Bowel Regimen, Delirium Protocol, Frequent Pain Assessment, Oral Care, Palliative Wound Care and Turn Reposition  PSYCHO-SOCIAL/SPIRITUAL:  Support System: family   Desire for further Chaplaincy support:NO   Additional Recommendations (Limitations, Scope, Preferences):  Continue with current plan of care with no escalation.    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   left helix   BPH (benign prostatic hyperplasia)    Dermatophytosis of nail    No pertinent past medical history    Osteoarthritis     PAST SURGICAL HISTORY:  Past Surgical History:  Procedure Laterality Date   bilatera lens replacement sfor cataracts  age 47   CYSTOSCOPY  09/13/2012   Procedure: CYSTOSCOPY;  Surgeon: Molli Hazard, MD;  Location: [facility];  Service: Urology;  Laterality: N/A;   CYSTOSCOPY WITH BIOPSY   10/25/2012   Procedure: CYSTOSCOPY WITH BIOPSY;  Surgeon: Molli Hazard, MD;  Location: [facility];  Service: Urology;  Laterality: N/A;   detached retina surgery left eye  age 61   EYE SURGERY  age 3   partial cornea tranplants   hydrocele surgery  age 62   JOINT REPLACEMENT  2010   right knee replacement   TONSILLECTOMY  age 42   TRANSURETHRAL RESECTION OF BLADDER TUMOR  09/13/2012   Procedure: TRANSURETHRAL RESECTION OF BLADDER TUMOR (TURBT);  Surgeon: Molli Hazard, MD;  Location: [facility];  Service: Urology;  Laterality: N/A;  fulguration of bladder tumor    ALLERGIES:  has No Known Allergies.   MEDICATIONS:  Current Facility-Administered Medications  Medication Dose Route Frequency Provider Last Rate Last Dose   acetaminophen (TYLENOL) tablet 650 mg  650 mg Oral Q6H PRN Lenore Cordia, MD       Or   acetaminophen (TYLENOL) suppository 650 mg  650 mg Rectal Q6H PRN Lenore Cordia, MD       aspirin chewable tablet 81 mg  81 mg Oral Daily Lenore Cordia, MD   81 mg at 09/13/19 1119   Chlorhexidine Gluconate Cloth 2 % PADS 6 each  6 each Topical Daily Donato Heinz, MD   6 each at 09/13/19 1120   Chlorhexidine Gluconate Cloth 2 % PADS 6 each  6 each Topical Q0600 Mariel Aloe, MD   6 each at 09/13/19 0545   dextrose 5 % solution   Intravenous Continuous Zachary Poisson, MD       ferrous sulfate tablet 325 mg  325 mg Oral BID WC Zada Finders R, MD   325 mg at 09/13/19 1120   finasteride (PROSCAR) tablet 5 mg  5 mg Oral Daily Zada Finders R, MD   5 mg at 09/13/19 1119   heparin injection 5,000 Units  5,000 Units Subcutaneous Q8H Lenore Cordia, MD   5,000 Units at 09/13/19 0541   MEDLINE mouth rinse  15 mL Mouth Rinse BID Mariel Aloe, MD   15 mL at 09/13/19 1121   metoprolol tartrate (LOPRESSOR) injection 5 mg  5 mg Intravenous Q8H Mariel Aloe, MD   5 mg at 09/13/19 0544   mupirocin ointment (BACTROBAN) 2 % 1 application  1 application  Nasal BID Mariel Aloe, MD   1 application at 24/09/73 1119   potassium chloride SA (KLOR-CON) CR tablet 40 mEq  40 mEq Oral BID Zachary Poisson, MD   40 mEq at 09/13/19 1120   Resource ThickenUp Clear   Oral PRN Mariel Aloe, MD       vitamin B-12 (CYANOCOBALAMIN) tablet 500 mcg  500 mcg Oral Daily Zada Finders R, MD   500 mcg at 09/13/19 1123    VITAL SIGNS: BP 106/66 (BP Location: Left Arm)    Pulse 96    Temp 97.9 F (36.6 C) (Oral)  Resp 14    Wt 68.9 kg    SpO2 100%    BMI 21.79 kg/m  Filed Weights   09/10/19 0500 09/11/19 0712 09/12/19 0555  Weight: 75.8 kg 70 kg 68.9 kg    Estimated body mass index is 21.79 kg/m as calculated from the following:   Height as of 03/20/19: '5\' 10"'  (1.778 m).   Weight as of this encounter: 68.9 kg.  LABS: CBC:    Component Value Date/Time   WBC 7.8 09/11/2019 0326   HGB 10.1 (L) 09/11/2019 0326   HCT 34.3 (L) 09/11/2019 0326   PLT 222 09/11/2019 0326   Comprehensive Metabolic Panel:    Component Value Date/Time   NA 152 (H) 09/13/2019 0320   K 3.1 (L) 09/13/2019 0320   CO2 22 09/13/2019 0320   BUN 57 (H) 09/13/2019 0320   CREATININE 1.57 (H) 09/13/2019 0320   ALBUMIN 2.3 (L) 09/13/2019 0320     Review of Systems  Unable to perform ROS: Acuity of condition  Dementia   Physical Exam General: NAD, frail chronically-ill appearing, thin Cardiovascular: regular rate and rhythm Pulmonary: diminished bilaterally  Abdomen: soft, nontender, + bowel sounds Extremities: no edema, no joint deformities Skin: no rashes, scattered bruising, sacral ulceration noted (not observed) Neurological: Weakness, mood is appropriate, alert to self only    Prognosis: weeks in the setting of severe protein calorie malnutrition, albumin 2.2, dehydration, poor po intake, bedbound, pressure ulcer, sacral osteomyelitis, UTI, AMS, Acute on chronic renal disease, hypernatremia, hypokalemia, deconditioning, dementia,CHF (EF 45-50%)   Discharge  Planning:  Hospice facility at family's request.   Recommendations:  DNR/DNI-as confirmed by sons  Continue with current plan of care, no escalation or aggressive interventions.   Sons Educational psychologist after [place] discussion and updates from Dr. Karleen Hampshire. (referral placed).   PMT will continue to support and follow.   Palliative Performance Scale: PPS 20%               Sons Richardson Landry and Zachary Obrien expressed understanding and was in agreement with this plan.   Thank you for allowing the Palliative Medicine Team to assist in the care of this patient.  Time In: 0905 Time Out: 1040 Time Total: 95 min.   Visit consisted of counseling and education dealing with the complex and emotionally intense issues of symptom management and palliative care in the setting of serious and potentially life-threatening illness.Greater than 50%  of this time was spent counseling and coordinating care related to the above assessment and plan.  Signed by:  Alda Lea, AGPCNP-BC Palliative Medicine Team  Phone: [phone] Fax: [phone] Pager: [phone] Amion: Bjorn Pippin

## 2019-09-14 LAB — RENAL FUNCTION PANEL
Albumin: 2.6 g/dL — ABNORMAL LOW (ref 3.5–5.0)
Anion gap: 9 (ref 5–15)
BUN: 46 mg/dL — ABNORMAL HIGH (ref 8–23)
CO2: 22 mmol/L (ref 22–32)
Calcium: 8.5 mg/dL — ABNORMAL LOW (ref 8.9–10.3)
Chloride: 118 mmol/L — ABNORMAL HIGH (ref 98–111)
Creatinine, Ser: 1.27 mg/dL — ABNORMAL HIGH (ref 0.61–1.24)
GFR calc Af Amer: 55 mL/min — ABNORMAL LOW (ref >60)
GFR calc non Af Amer: 47 mL/min — ABNORMAL LOW (ref >60)
Glucose, Bld: 153 mg/dL — ABNORMAL HIGH (ref 70–99)
Phosphorus: 2.1 mg/dL — ABNORMAL LOW (ref 2.5–4.6)
Potassium: 3.5 mmol/L (ref 3.5–5.1)
Sodium: 149 mmol/L — ABNORMAL HIGH (ref 135–145)

## 2019-09-14 MED ORDER — RESOURCE THICKENUP CLEAR PO POWD: ORAL | Status: AC

## 2019-09-14 NOTE — Progress Notes (Signed)
Patient being discharged to [facility], via [facility].  Report called to Helene Kelp, Therapist, sports at [facility].

## 2019-09-14 NOTE — Progress Notes (Addendum)
Manufacturing engineer [facility])   Referral received from PMT for residential hospice at [facility].    Bed availability has not been determined at this time.  Will wait to hear if beds are available, then reach out to Riley and family.  Thank you, [facility] Liaison (in [place]) [phone]  **930 am, [facility] will have a bed for Mr. Midura today. Spoke with son, confirmed interest.  Necessary consents to be completed at noon.  ACC will update TOC manager once consents complete so transportation can be arranged.  RN staff:  Please call report at any time to [phone], bed assigned at that time Please d/c any IVs, if indwelling foley is in place, please leave in.  **245 pm, consents completed, please arrange transport to [facility] via [place]

## 2019-09-14 NOTE — Progress Notes (Signed)
SLP Note  Patient Details Name: ZEKIEL FERRELLI MRN: [id number] DOB: 12-Nov-1920    Discharge  treatment:       Reason Eval/Treat Not Completed: (pt now comfort care and for dc to [facility], will sign off.  thanks.) Luanna Salk, [facility] SLP Acute Rehab Services Pager [phone] Office [phone]   Macario Golds 09/14/2019, 5:02 PM

## 2019-09-14 NOTE — Discharge Summary (Signed)
Physician Discharge Summary  Zachary Obrien [id number] DOB: [birth year] DOA: 09/05/2019  PCP: Josetta Huddle, MD  Admit date: 09/05/2019 Discharge date: 09/14/2019  Admitted From: Home.  Disposition:  [facility].   Recommendations for Outpatient Follow-up:  1. Follow up with hospice MD as needed.     Discharge Condition: hospice.  CODE STATUS:comfort care.  Diet recommendation: comfort feeds.   Brief/Interim Summary: 85 year old gentleman with prior history of bladder cancers, BPH presented from SNF with dehydration and urinary tract infection his hospital course was complicated by hypernatremia urinary retention and also was found to have sacral osteomyelitis.  In view of poor functional status and clinical deterioration palliative care consulted for goals of care discussion. Family decided to transition to residential hospice for EOL care.   Discharge Diagnoses:  Principal Problem:   Acute kidney injury ([place]) Active Problems:   AKI (acute kidney injury) ([place])   Hypernatremia   Pressure injury of skin  Acute kidney injury secondary to dehydration and poor oral intake Renal ultrasound unremarkable. CT of the abdomen and pelvis shows urinary retention and hydronephrosis, Foley catheter was placed on 09/10/2019. Nephrology consulted and recommendations given.  In view of his poor functional status and clinical deterioration despite fluids , palliative care consulted , who had [place] discussions with the family and transfer the patient to residential hospice at [facility] when bed is available.     History of combined systolic and diastolic heart failure He appears to be dehydrated at this time continue to hold Lasix for AKI. And continue with gentle hydration till he gets discharged.     Urinary tract infection Urine cultures growing E. coli and Aerococcus species Completed the course of Rocephin.  Osteomyelitis of the sacrum possibly secondary to sacral decubitus  ulcer.  Patient's son would like to avoid all interventions at this time and transition to residential hospice when a bed is available.    Hypokalemia: replaced.   Dehydration/failure to thrive Decreased oral intake and poor poor functional status. Palliative care consulted for goals of care discussion and possible transition to residential hospice for EOL care.   Stage III sacral pressure ulcer present on admission Pressure Injury 09/06/19 Sacrum Medial;Upper Stage III -  Full thickness tissue loss. Subcutaneous fat may be visible but bone, tendon or muscle are NOT exposed. (Active)  09/06/19 0421  Location: Sacrum  Location Orientation: Medial;Upper  Staging: Stage III -  Full thickness tissue loss. Subcutaneous fat may be visible but bone, tendon or muscle are NOT exposed.  Wound Description (Comments):   Present on Admission: Yes     Hypernatremia Free water deficit.  Change the fluids to dextrose fluids at 100 mL/h, improving sodium levels. Continue with IV fluids till patient is discharged to [facility] place.   Poor prognosis.     Discharge Instructions   Allergies as of 09/14/2019   No Known Allergies     Medication List    STOP taking these medications   ascorbic acid 500 MG tablet Commonly known as: VITAMIN C   aspirin 81 MG chewable tablet   cholecalciferol 25 MCG (1000 UT) tablet Commonly known as: VITAMIN D   DAILY MULTIVITAMIN PO   ferrous sulfate 325 (65 FE) MG tablet   traMADol 50 MG tablet Commonly known as: ULTRAM   vitamin B-12 500 MCG tablet Commonly known as: CYANOCOBALAMIN     TAKE these medications   acetaminophen 325 MG tablet Commonly known as: TYLENOL Take 2 tablets (650 mg total) by mouth every 6 (  six) hours as needed for mild pain, fever or headache (fever >/= 101). What changed: when to take this   docusate sodium 100 MG capsule Commonly known as: COLACE Take 100 mg by mouth daily as needed for mild constipation.    finasteride 5 MG tablet Commonly known as: PROSCAR Take 5 mg by mouth daily.   metoprolol tartrate 25 MG tablet Commonly known as: LOPRESSOR Take 25 mg by mouth 2 (two) times daily.   NUTRITIONAL DRINK PO Take 120 mLs by mouth 2 (two) times daily. Medpass 2.0   polyethylene glycol 17 g packet Commonly known as: MIRALAX / GLYCOLAX Take 17 g by mouth daily.   Resource ThickenUp Clear Powd Use as needed.   senna 8.6 MG Tabs tablet Commonly known as: SENOKOT Take 1 tablet by mouth.       No Known Allergies  Consultations:  Palliative care  Nephrology.    Procedures/Studies: Ct Abdomen Pelvis Wo Contrast  Result Date: 09/10/2019 CLINICAL DATA:  Concern for UTI, dehydration, history of bladder cancer EXAM: CT ABDOMEN AND PELVIS WITHOUT CONTRAST TECHNIQUE: Multidetector CT imaging of the abdomen and pelvis was performed following the standard protocol without IV contrast. COMPARISON:  CT abdomen pelvis 08/12/2014, CTA chest 03/19/2019 FINDINGS: Lower chest: Basilar bronchiectatic changes and mucous plugging within the airways of the right left lower lobes. Some adjacent ground-glass opacity. Mild cardiomegaly. Coronary artery calcifications. Calcifications on the aortic leaflets. No pericardial effusion. Hepatobiliary: Punctate calcifications in the otherwise normal liver, likely sequela of granulomatous disease. Gallbladder distention with few partially calcified dependently layering gallstones. No visible intraductal gallstones. No biliary ductal dilatation. Pancreas: Fatty atrophy of the pancreas. No concerning pancreatic lesions or peripancreatic inflammation. No pancreatic ductal dilatation. Spleen: Normal in size without focal abnormality. Adrenals/Urinary Tract: Normal adrenal glands. Stable hyperdense cysts in the upper pole right kidney. Largest measuring 12 mm in size (2/39. Larger fluid attenuation cyst measuring up to 8.2 cm, and creased size from comparison exam large 7.3  cm exophytic lower pole right renal cyst is unchanged from prior. Multiple smaller cysts are seen in both kidneys. Asymmetric atrophy of the left kidney is present. There is severe bilateral hydroureteronephrosis to the level of the severely distended bladder which demonstrates some asymmetric posterior bladder wall thickening. Additionally there is indentation of the bladder base by the enlarged prostate. Mild circumferential bladder wall thickening. Stomach/Bowel: Distal esophagus, stomach and duodenal sweep are unremarkable. No small bowel wall thickening or dilatation. Fecalization of the distal small bowel contents. A normal appendix is visualized. No colonic dilatation or wall thickening. Scattered colonic diverticula without focal pericolonic inflammation to suggest diverticulitis. Inspissated stool ball at the rectum with some pericolonic stranding and presacral stranding. Vascular/Lymphatic: Atherosclerotic plaque within the normal caliber aorta. Focal region of mid mesenteric hazy stranding with numerous reactive appearing clustered mid mesenteric lymph nodes compatible with mesenteritis. Few reactive nodes are present in the upper abdomen. Reproductive: Borderline prostatomegaly with few coarse calcifications. Other: Skin thickening superficial to the sacrum and coccyx with underlying subcutaneous gas tracking to the level of the bone with some associated lucent features. Presacral stranding seen in close vicinity. Circumferential body wall edema. No bowel containing hernias. Musculoskeletal: Lucent changes of the coccyx near region of presacral fat stranding. Multilevel degenerative changes are present in the imaged portions of the spine. Additional degenerative changes noted in the SI joints hips. IMPRESSION: 1. Severe bilateral hydroureteronephrosis to the level of the markedly distended bladder with asymmetric posterior bladder wall thickening concerning for an underlying  malignancy on a background  chronic outlet obstruction. 2. Skin thickening superficial to the sacrum and coccyx with underlying subcutaneous gas tracking to the level of the bone with some associated lucent features, concerning for a sacral decubitus ulcer with features concerning for osteomyelitis. 3. Inspissated stool ball at the rectum. Perirectal and presacral stranding could reflect a stercoral colitis or be secondary to the decubitus process, as detailed above. 4. Fecalization of the distal small bowel contents, suggestive of delayed transit. 5. Focal region of mid mesenteric hazy stranding with numerous reactive appearing clustered mid mesenteric lymph nodes compatible with mesenteritis. 6. Basilar bronchiectasis with areas of mucous plugging and features suggestive for a more acute infectious or inflammatory process in the right lung base. 7. Aortic Atherosclerosis (ICD10-I70.0). These results will be called to the ordering clinician or representative by the Radiologist Assistant, and communication documented in the PACS or zVision Dashboard. Electronically Signed   By: Lovena Le M.D.   On: 09/10/2019 15:18   US Renal  Result Date: 09/08/2019 CLINICAL DATA:  53 year old with acute kidney injury. EXAM: RENAL / URINARY TRACT ULTRASOUND COMPLETE COMPARISON:  CT of the abdomen 08/15/2014 FINDINGS: Right Kidney: Renal measurements: 9.1 x 4.8 x 5.3 cm = volume: 121 mL. Right kidney is not well visualized. There is a large anechoic cyst involving the upper pole that measures 5.2 x 7.3 x 6.2 cm. No evidence for right hydronephrosis. Hypoechoic material in the renal sinus area probably represents known cysts. Left Kidney: Renal measurements: 7.5 x 3.0 x 3.8 cm = volume: 45 mL. Negative for hydronephrosis. Limited evaluation of the left kidney. Anechoic cyst near the upper pole that measures 4.0 x 4.4 x 3.8 cm. Bladder: Appears normal for degree of bladder distention. IMPRESSION: 1. Limited evaluation of both kidneys. No evidence for  hydronephrosis. 2. Bilateral renal cysts. Electronically Signed   By: Markus Daft M.D.   On: 09/08/2019 09:39   Dg Chest Port 1 View  Result Date: 09/05/2019 CLINICAL DATA:  85 year old male with history of choking on food. Possible aspiration. EXAM: PORTABLE CHEST 1 VIEW COMPARISON:  Chest x-ray 03/18/2019. FINDINGS: Lung volumes are low. No consolidative airspace disease. No pleural effusions. No pneumothorax. No pulmonary nodule or mass noted. Mild cardiomegaly. The patient is rotated to the right on today's exam, resulting in distortion of the mediastinal contours and reduced diagnostic sensitivity and specificity for mediastinal pathology. Aortic atherosclerosis. IMPRESSION: 1. Low lung volumes without radiographic evidence of acute cardiopulmonary disease. 2. Aortic atherosclerosis. Electronically Signed   By: Vinnie Langton M.D.   On: 09/05/2019 16:47   Dg Abd Portable 1v  Result Date: 09/09/2019 CLINICAL DATA:  Abdominal distention. EXAM: PORTABLE ABDOMEN - 1 VIEW COMPARISON:  09/07/2019 FINDINGS: The bowel gas pattern is normal. No evidence of dilated bowel loops. No radiopaque calculi identified. Vascular calcification incidentally noted. IMPRESSION: Unremarkable bowel gas pattern.  No acute findings. Electronically Signed   By: Marlaine Hind M.D.   On: 09/09/2019 15:39   Dg Abd Portable 1v  Result Date: 09/07/2019 CLINICAL DATA:  History of bladder cancer. EXAM: PORTABLE ABDOMEN - 1 VIEW COMPARISON:  CT abdomen/pelvis 08/15/2014 FINDINGS: The bowel gas pattern is normal. No radio-opaque calculi or other significant radiographic abnormality are seen. IMPRESSION: No acute abdominal abnormality. Electronically Signed   By: Kathreen Devoid   On: 09/07/2019 11:40       Subjective: No complaints.   Discharge Exam: Vitals:   09/13/19 2207 09/14/19 0626  BP: 114/62 112/66  Pulse: 91 93  Resp:  16  Temp:  98 F (36.7 C)  SpO2:  100%   Vitals:   09/13/19 1357 09/13/19 2057 09/13/19  2207 09/14/19 0626  BP: (!) 105/59 116/62 114/62 112/66  Pulse: 86 89 91 93  Resp: 16 16  16   Temp: 97.8 F (36.6 C) 97.8 F (36.6 C)  98 F (36.7 C)  TempSrc: Oral Oral  Oral  SpO2: 100% 100%  100%  Weight:        General: Pt is alert, awake, not in acute distress Cardiovascular: RRR, S1/S2 +, no rubs, no gallops Respiratory: CTA bilaterally, no wheezing, no rhonchi Abdominal: Soft, NT, ND, bowel sounds + Extremities: no edema, no cyanosis    The results of significant diagnostics from this hospitalization (including imaging, microbiology, ancillary and laboratory) are listed below for reference.     Microbiology: Recent Results (from the past 240 hour(s))  SARS CORONAVIRUS 2 (TAT 6-24 HRS) Nasopharyngeal Nasopharyngeal Swab     Status: None   Collection Time: 09/05/19  4:37 PM   Specimen: Nasopharyngeal Swab  Result Value Ref Range Status   SARS Coronavirus 2 NEGATIVE NEGATIVE Final    Comment: (NOTE) SARS-CoV-2 target nucleic acids are NOT DETECTED. The SARS-CoV-2 RNA is generally detectable in upper and lower respiratory specimens during the acute phase of infection. Negative results do not preclude SARS-CoV-2 infection, do not rule out co-infections with other pathogens, and should not be used as the sole basis for treatment or other patient management decisions. Negative results must be combined with clinical observations, patient history, and epidemiological information. The expected result is Negative. Fact Sheet for Patients: SugarRoll.be Fact Sheet for Healthcare Providers: https://www.woods-mathews.com/ This test is not yet approved or cleared by the [place] FDA and  has been authorized for detection and/or diagnosis of SARS-CoV-2 by FDA under an Emergency Use Authorization (EUA). This EUA will remain  in effect (meaning this test can be used) for the duration of the COVID-19 declaration under Section  56 4(b)(1) of the Act, 21 U.S.C. section 360bbb-3(b)(1), unless the authorization is terminated or revoked sooner. Performed at [facility], [place] [address], [place], [place] [address]   Culture, blood (single) w Reflex to ID Panel     Status: None   Collection Time: 09/05/19  5:09 PM   Specimen: BLOOD  Result Value Ref Range Status   Specimen Description   Final    BLOOD RIGHT ARM Performed at [facility] [address], [place], [place] [address]    Special Requests   Final    BOTTLES DRAWN AEROBIC AND ANAEROBIC Blood Culture adequate volume Performed at [facility] [address] [place], [place] [address]    Culture   Final    NO GROWTH 5 DAYS Performed at [facility], [place] [address], [place], [place] [address]    Report Status 09/10/2019 FINAL  Final  Culture, Urine     Status: Abnormal   Collection Time: 09/05/19  6:00 PM   Specimen: Urine, Clean Catch  Result Value Ref Range Status   Specimen Description   Final    URINE, CLEAN CATCH Performed at [facility], [place] [address] [place], [place] [address]    Special Requests   Final    NONE Performed at [facility], [place] [address] [place], [place] [address]    Culture (A)  Final    >=100,000 COLONIES/mL ESCHERICHIA COLI >=100,000 COLONIES/mL AEROCOCCUS SPECIES Standardized susceptibility  testing for this organism is not available. Performed at [facility], [place] [address] [place], [place] [address]    Report Status 09/08/2019 FINAL  Final   Organism ID, Bacteria ESCHERICHIA COLI (A)  Final      Susceptibility   Escherichia coli - MIC*    AMPICILLIN 8 SENSITIVE Sensitive     CEFAZOLIN <=4 SENSITIVE Sensitive     CEFTRIAXONE <=1 SENSITIVE Sensitive     CIPROFLOXACIN <=0.25 SENSITIVE Sensitive     GENTAMICIN <=1 SENSITIVE Sensitive     IMIPENEM <=0.25 SENSITIVE Sensitive     NITROFURANTOIN <=16 SENSITIVE  Sensitive     TRIMETH/SULFA >=320 RESISTANT Resistant     AMPICILLIN/SULBACTAM <=2 SENSITIVE Sensitive     PIP/TAZO <=4 SENSITIVE Sensitive     Extended ESBL NEGATIVE Sensitive     * >=100,000 COLONIES/mL ESCHERICHIA COLI  MRSA PCR Screening     Status: Abnormal   Collection Time: 09/09/19  3:17 PM   Specimen: Nasal Mucosa; Nasopharyngeal  Result Value Ref Range Status   MRSA by PCR POSITIVE (A) NEGATIVE Final    Comment:        The GeneXpert MRSA Assay (FDA approved for NASAL specimens only), is one component of a comprehensive MRSA colonization surveillance program. It is not intended to diagnose MRSA infection nor to guide or monitor treatment for MRSA infections. RESULT CALLED TO, READ BACK BY AND VERIFIED WITH: T.THURMAN,RN AY:[id number] @1158  BY V.WILKINS Performed at [facility] [address], [place], [place] [address]      Labs: BNP (last 3 results) No results for input(s): BNP in the last 8760 hours. Basic Metabolic Panel: Recent Labs  Lab 09/10/19 0336 09/11/19 0326 09/12/19 0301 09/13/19 0320 09/14/19 0253  NA 147* 146* 152* 152* 149*  K 3.0* 3.5 2.8* 3.1* 3.5  CL 118* 115* 117* 119* 118*  CO2 16* 20* 23 22 22   GLUCOSE 134* 138* 105* 147* 153*  BUN 67* 66* 65* 57* 46*  CREATININE 1.88* 1.89* 1.74* 1.57* 1.27*  CALCIUM 8.6* 8.9 8.6* 8.4* 8.5*  PHOS  --  4.7* 4.2 3.1 2.1*   Liver Function Tests: Recent Labs  Lab 09/10/19 0336 09/11/19 0326 09/12/19 0301 09/13/19 0320 09/14/19 0253  AST 25  --   --   --   --   ALT 39  --   --   --   --   ALKPHOS 84  --   --   --   --   BILITOT 0.3  --   --   --   --   PROT 7.4  --   --   --   --   ALBUMIN 2.5* 2.6* 2.4* 2.3* 2.6*   No results for input(s): LIPASE, AMYLASE in the last 168 hours. No results for input(s): AMMONIA in the last 168 hours. CBC: Recent Labs  Lab 09/11/19 0326  WBC 7.8  HGB 10.1*  HCT 34.3*  MCV 102.7*  PLT 222   Cardiac Enzymes: No results for input(s):  CKTOTAL, CKMB, CKMBINDEX, TROPONINI in the last 168 hours. BNP: Invalid input(s): POCBNP CBG: No results for input(s): GLUCAP in the last 168 hours. D-Dimer No results for input(s): DDIMER in the last 72 hours. Hgb A1c No results for input(s): HGBA1C in the last 72 hours. Lipid Profile No results for input(s): CHOL, HDL, LDLCALC, TRIG, CHOLHDL, LDLDIRECT in the last 72 hours. Thyroid function studies No results for input(s): TSH, T4TOTAL, T3FREE, THYROIDAB in the last 72 hours.  Invalid input(s): FREET3 Anemia work up No results for input(s): VITAMINB12, FOLATE, FERRITIN, TIBC, IRON, RETICCTPCT in the last 72 hours. Urinalysis    Component Value Date/Time   COLORURINE YELLOW 09/05/2019 1800   APPEARANCEUR CLOUDY (A) 09/05/2019 1800   LABSPEC 1.013 09/05/2019 1800   PHURINE 5.0 09/05/2019 1800   GLUCOSEU NEGATIVE 09/05/2019 1800   HGBUR SMALL (A) 09/05/2019 1800   BILIRUBINUR NEGATIVE 09/05/2019 1800   KETONESUR NEGATIVE 09/05/2019 1800   PROTEINUR 30 (A) 09/05/2019 1800   UROBILINOGEN 0.2 01/10/2009 1100   NITRITE POSITIVE (A) 09/05/2019 1800   LEUKOCYTESUR LARGE (A) 09/05/2019 1800   Sepsis Labs Invalid input(s): PROCALCITONIN,  WBC,  LACTICIDVEN Microbiology Recent Results (from the past 240 hour(s))  SARS CORONAVIRUS 2 (TAT 6-24 HRS) Nasopharyngeal Nasopharyngeal Swab     Status: None   Collection Time: 09/05/19  4:37 PM   Specimen: Nasopharyngeal Swab  Result Value Ref Range Status   SARS Coronavirus 2 NEGATIVE NEGATIVE Final    Comment: (NOTE) SARS-CoV-2 target nucleic acids are NOT DETECTED. The SARS-CoV-2 RNA is generally detectable in upper and lower respiratory specimens during the acute phase of infection. Negative results do not preclude SARS-CoV-2 infection, do not rule out co-infections with other pathogens, and should not be used as the sole basis for treatment or other patient management decisions. Negative results must be combined with clinical  observations, patient history, and epidemiological information. The expected result is Negative. Fact Sheet for Patients: SugarRoll.be Fact Sheet for Healthcare Providers: https://www.woods-mathews.com/ This test is not yet approved or cleared by the [place] FDA and  has been authorized for detection and/or diagnosis of SARS-CoV-2 by FDA under an Emergency Use Authorization (EUA). This EUA will remain  in effect (meaning this test can be used) for the duration of the COVID-19 declaration under Section 56 4(b)(1) of the Act, 21 U.S.C. section 360bbb-3(b)(1), unless the authorization is terminated or revoked sooner. Performed at [facility], [place] [address], [place], [place] [address]   Culture, blood (single) w Reflex to ID Panel     Status: None   Collection Time: 09/05/19  5:09 PM   Specimen: BLOOD  Result Value Ref Range Status   Specimen Description   Final    BLOOD RIGHT ARM Performed at [facility] [address], [place], [place] [address]    Special Requests   Final    BOTTLES DRAWN AEROBIC AND ANAEROBIC Blood Culture adequate volume Performed at [facility] [address], [place], [place] [address]    Culture   Final    NO GROWTH 5 DAYS Performed at [facility], [place] [address] [place], [place] [address]    Report Status 09/10/2019 FINAL  Final  Culture, Urine     Status: Abnormal   Collection Time: 09/05/19  6:00 PM   Specimen: Urine, Clean Catch  Result Value Ref Range Status   Specimen Description   Final    URINE, CLEAN CATCH Performed at [facility], [place] [address], [place], [place] [address]    Special Requests   Final    NONE Performed at [facility], [place] [address] [place], [place] [address]    Culture (A)  Final    >=100,000 COLONIES/mL ESCHERICHIA COLI >=100,000 COLONIES/mL AEROCOCCUS SPECIES Standardized  susceptibility testing for this organism is not available. Performed at [facility], [place] [address], [place],  [address]    Report Status 09/08/2019 FINAL  Final   Organism ID, Bacteria ESCHERICHIA COLI (A)  Final      Susceptibility   Escherichia coli - MIC*    AMPICILLIN 8 SENSITIVE Sensitive     CEFAZOLIN <=4 SENSITIVE Sensitive     CEFTRIAXONE <=1 SENSITIVE Sensitive     CIPROFLOXACIN <=0.25 SENSITIVE Sensitive     GENTAMICIN <=1 SENSITIVE Sensitive     IMIPENEM <=0.25 SENSITIVE Sensitive     NITROFURANTOIN <=16 SENSITIVE Sensitive     TRIMETH/SULFA >=320 RESISTANT Resistant     AMPICILLIN/SULBACTAM <=2 SENSITIVE Sensitive     PIP/TAZO <=4 SENSITIVE Sensitive     Extended ESBL NEGATIVE Sensitive     * >=100,000 COLONIES/mL ESCHERICHIA COLI  MRSA PCR Screening     Status: Abnormal   Collection Time: 09/09/19  3:17 PM   Specimen: Nasal Mucosa; Nasopharyngeal  Result Value Ref Range Status   MRSA by PCR POSITIVE (A) NEGATIVE Final    Comment:        The GeneXpert MRSA Assay (FDA approved for NASAL specimens only), is one component of a comprehensive MRSA colonization surveillance program. It is not intended to diagnose MRSA infection nor to guide or monitor treatment for MRSA infections. RESULT CALLED TO, READ BACK BY AND VERIFIED WITH: T.THURMAN,RN AY:[id number] @1158  BY V.WILKINS Performed at [facility] [address] [place], [place] [address]      Time coordinating discharge: 31  minutes  SIGNED:   Hosie Poisson, MD  [facility] 09/14/2019, 1:07 PM Pager   If 7PM-7AM, please contact night-coverage [email or web address] Password [email or web address]

## 2019-09-14 NOTE — TOC Transition Note (Signed)
Transition of Care [facility]) - CM/SW Discharge Note   Patient Details  Name: Zachary Obrien MRN: [id number] Date of Birth: January 29, 1921  [facility] CM/SW Contact:  Leeroy Cha, RN Phone Number: 09/14/2019, 3:58 PM   Clinical Narrative:    tRANSFRERRED TO [facility] VIA P-TAR PACKET SENT WITH PATIENT RN [facility] NURSE MADE AWARE. PTAR CALLED AT 1600.   Final next level of care: [place] Barriers to Discharge: Barriers Resolved   Patient Goals and CMS Choice Patient states their goals for this hospitalization and ongoing recovery are:: to return [facility]      Discharge Placement                       Discharge Plan and Services   Discharge Planning Services: CM Consult Post Acute Care Choice: Resumption of Svcs/PTA Provider          DME Arranged: N/A DME Agency: NA       HH Arranged: NA HH Agency: NA        Social Determinants of Health (SDOH) Interventions     Readmission Risk Interventions Readmission Risk Prevention Plan 09/10/2019  Transportation Screening Complete  PCP or Specialist Appt within 3-5 Days Not Complete  Not Complete comments not yet ready for dc  HRI or [place] Not Complete  HRI or Home Care Consult comments will dc back to SNF  Social Work Consult for [place] Planning/Counseling Complete  Palliative Care Screening Not Applicable  Medication Review [employer]) Complete  Some recent data might be hidden

## 2019-09-14 NOTE — Progress Notes (Signed)
Nutrition Brief Note  Chart reviewed. Pt now transitioning to comfort care per Dr. Petra Kuba note on 11/05. Noted plan for pt to d/c to [facility] today per Hospice note. No further nutrition interventions warranted at this time.  Please consult as needed.    Gaynell Face, MS, RD, LDN Inpatient Clinical Dietitian Pager: [phone] Weekend/After Hours: [phone]

## 2019-10-09 DEATH — deceased

## 2020-08-11 IMAGING — CT CT ABD-PELV W/O CM
2 of 3 series · 14 of 42 positions shown, 16 images · non-contrast
Comparison: CT abdomen pelvis 08/12/2014, CTA chest 03/19/2019

CLINICAL DATA: Concern for UTI, dehydration, history of bladder
cancer

EXAM:
CT ABDOMEN AND PELVIS WITHOUT CONTRAST
TECHNIQUE: Multidetector CT imaging of the abdomen and pelvis was performed
following the standard protocol without IV contrast.

[Series 2: axial st · axial · 0.98mm/px · z∈[-681,-226]mm · 11 of 103 slices shown, 13 images]
[im 6/103  soft-tissue]
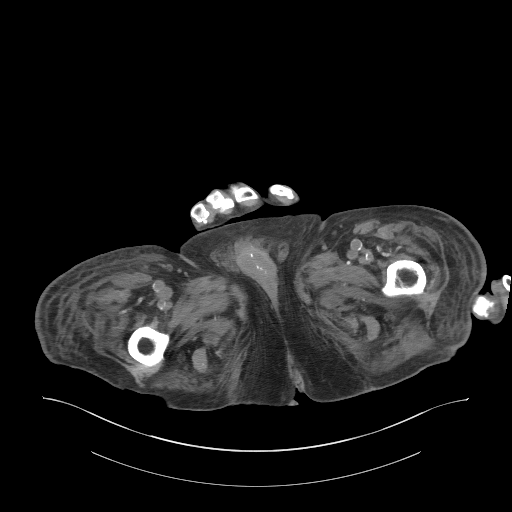
[im 6/103  bone]
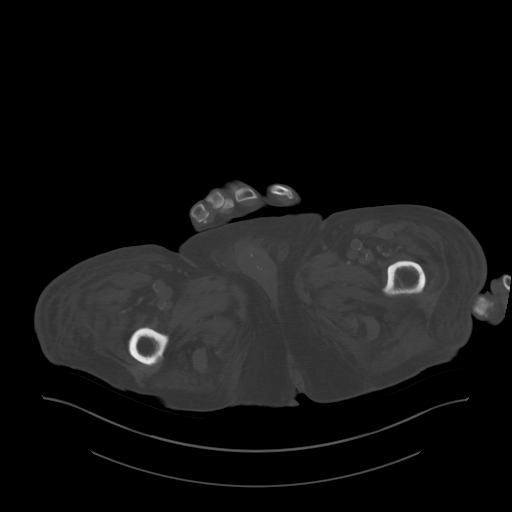
[im 16/103  soft-tissue]
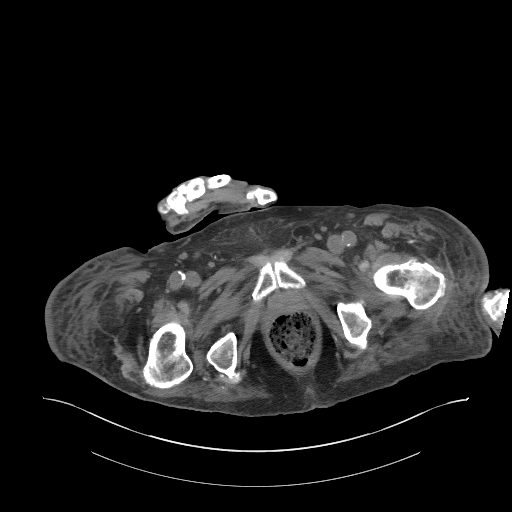
[im 26/103  soft-tissue]
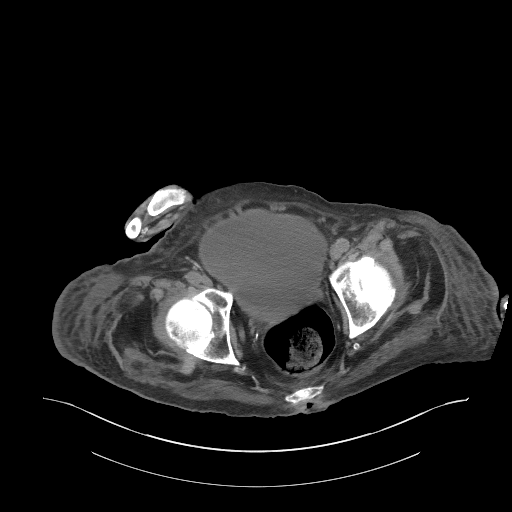
[im 36/103  soft-tissue]
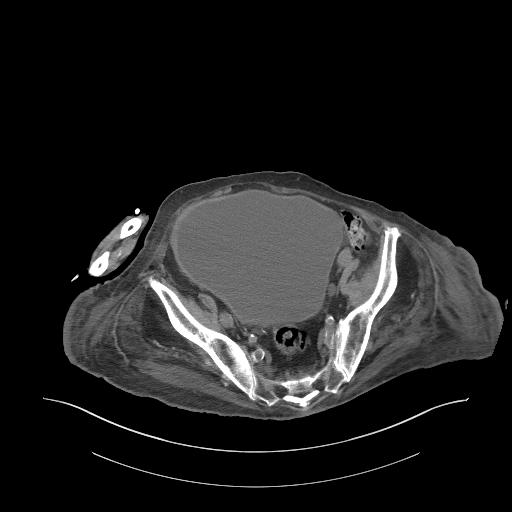
[im 41/103  soft-tissue]
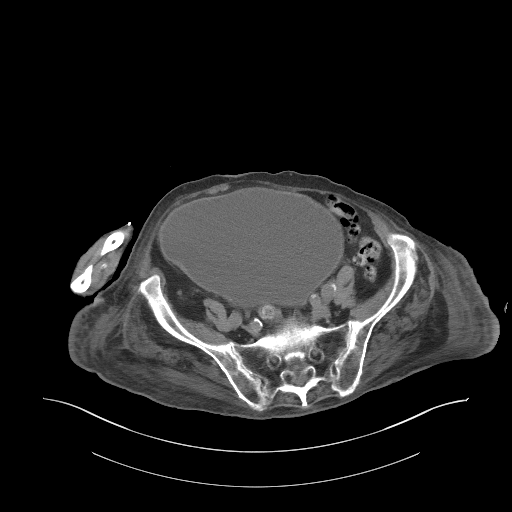
[im 52/103  soft-tissue]
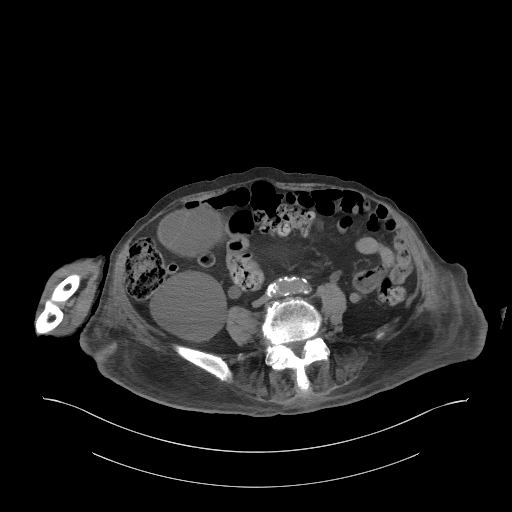
[im 62/103  soft-tissue]
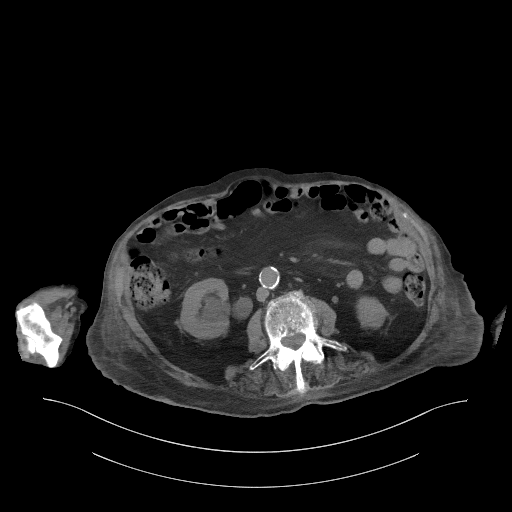
[im 67/103  soft-tissue]
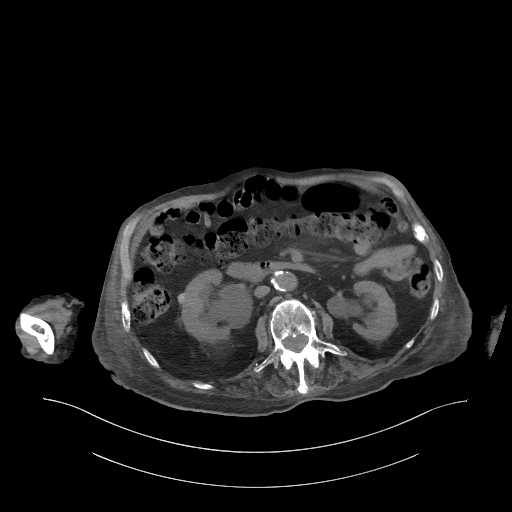
[im 77/103  soft-tissue]
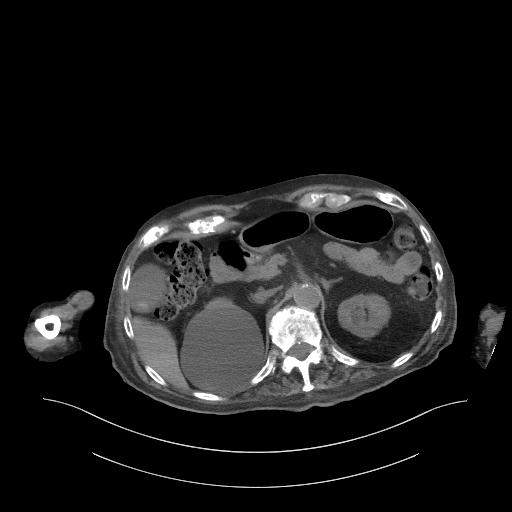
[im 77/103  bone]
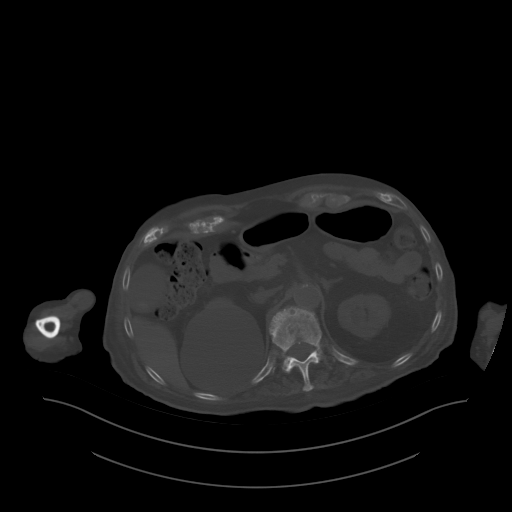
[im 87/103  soft-tissue]
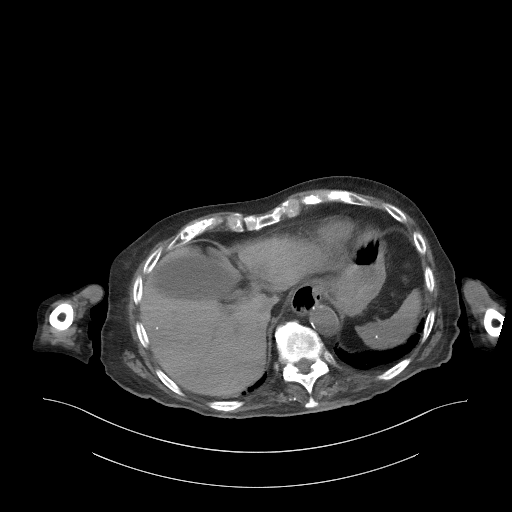
[im 97/103  soft-tissue]
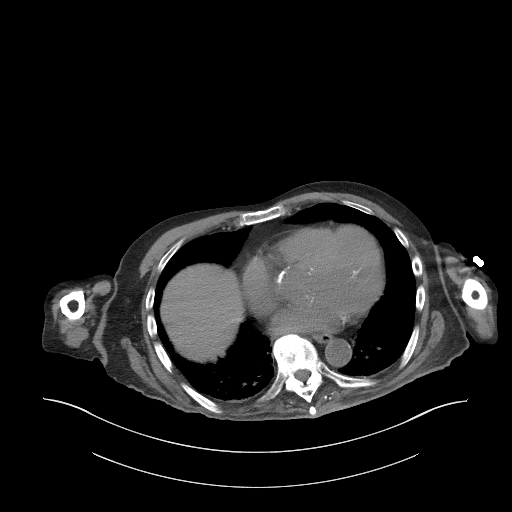

[Series 4: coronal st · coronal · 0.82mm/px · 3 of 83 slices shown]
[im 28/83  soft-tissue]
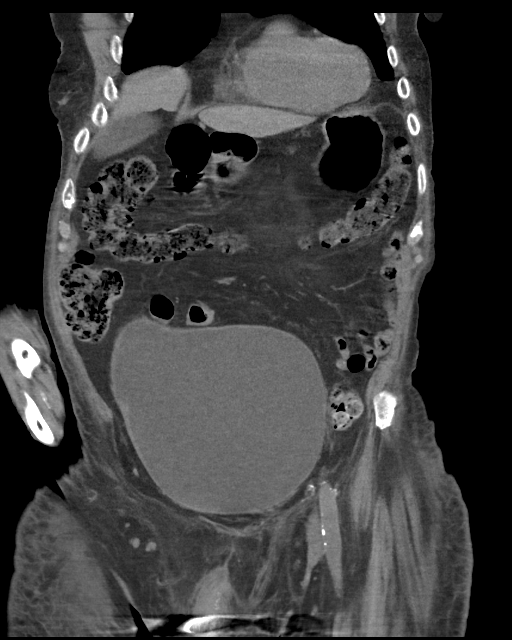
[im 37/83  soft-tissue]
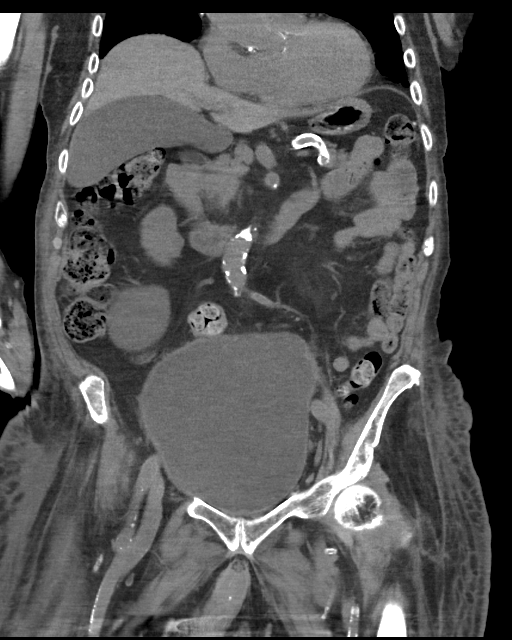
[im 46/83  soft-tissue]
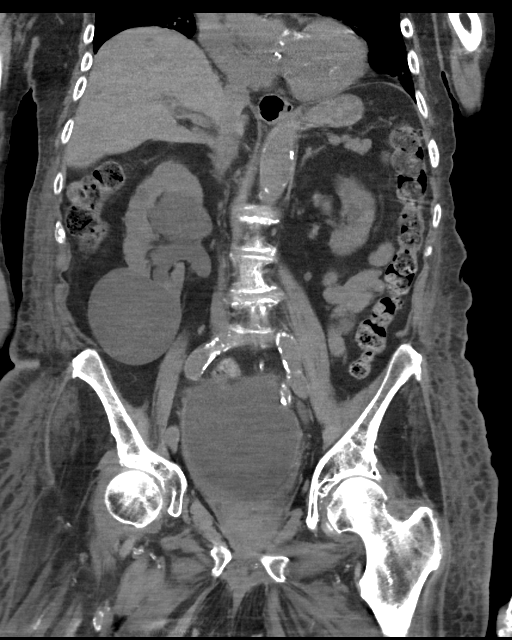

[14 of 42 positions shown; findings below may reference images not displayed]

FINDINGS: Lower chest: Basilar bronchiectatic changes and mucous plugging
within the airways of the right left lower lobes. Some adjacent
ground-glass opacity. Mild cardiomegaly. Coronary artery
calcifications. Calcifications on the aortic leaflets. No
pericardial effusion.

Hepatobiliary: Punctate calcifications in the otherwise normal
liver, likely sequela of granulomatous disease. Gallbladder
distention with few partially calcified dependently layering
gallstones. No visible intraductal gallstones. No biliary ductal
dilatation.

Pancreas: Fatty atrophy of the pancreas. No concerning pancreatic
lesions or peripancreatic inflammation. No pancreatic ductal
dilatation.

Spleen: Normal in size without focal abnormality.

Adrenals/Urinary Tract: Normal adrenal glands. Stable hyperdense
cysts in the upper pole right kidney. Largest measuring 12 mm in
size (2/39. Larger fluid attenuation cyst measuring up to 8.2 cm,
and creased size from comparison exam large 7.3 cm exophytic lower
pole right renal cyst is unchanged from prior. Multiple smaller
cysts are seen in both kidneys. Asymmetric atrophy of the left
kidney is present. There is severe bilateral hydroureteronephrosis
to the level of the severely distended bladder which demonstrates
some asymmetric posterior bladder wall thickening. Additionally
there is indentation of the bladder base by the enlarged prostate.
Mild circumferential bladder wall thickening.

Stomach/Bowel: Distal esophagus, stomach and duodenal sweep are
unremarkable. No small bowel wall thickening or dilatation.
Fecalization of the distal small bowel contents. A normal appendix
is visualized. No colonic dilatation or wall thickening. Scattered
colonic diverticula without focal pericolonic inflammation to
suggest diverticulitis. Inspissated stool ball at the rectum with
some pericolonic stranding and presacral stranding.

Vascular/Lymphatic: Atherosclerotic plaque within the normal caliber
aorta. Focal region of mid mesenteric hazy stranding with numerous
reactive appearing clustered mid mesenteric lymph nodes compatible
with mesenteritis. Few reactive nodes are present in the upper
abdomen.

Reproductive: Borderline prostatomegaly with few coarse
calcifications.

Other: Skin thickening superficial to the sacrum and coccyx with
underlying subcutaneous gas tracking to the level of the bone with
some associated lucent features. Presacral stranding seen in close
vicinity. Circumferential body wall edema. No bowel containing
hernias.

Musculoskeletal: Lucent changes of the coccyx near region of
presacral fat stranding. Multilevel degenerative changes are present
in the imaged portions of the spine. Additional degenerative changes
noted in the SI joints hips.
IMPRESSION: 1. Severe bilateral hydroureteronephrosis to the level of the
markedly distended bladder with asymmetric posterior bladder wall
thickening concerning for an underlying malignancy on a background
chronic outlet obstruction.
2. Skin thickening superficial to the sacrum and coccyx with
underlying subcutaneous gas tracking to the level of the bone with
some associated lucent features, concerning for a sacral decubitus
ulcer with features concerning for osteomyelitis.
3. Inspissated stool ball at the rectum. Perirectal and presacral
stranding could reflect a stercoral colitis or be secondary to the
decubitus process, as detailed above.
4. Fecalization of the distal small bowel contents, suggestive of
delayed transit.
5. Focal region of mid mesenteric hazy stranding with numerous
reactive appearing clustered mid mesenteric lymph nodes compatible
with mesenteritis.
6. Basilar bronchiectasis with areas of mucous plugging and features
suggestive for a more acute infectious or inflammatory process in
the right lung base.
7. Aortic Atherosclerosis ([id number]).

These results will be called to the ordering clinician or
representative by the Radiologist Assistant, and communication
documented in the PACS or zVision Dashboard.
